# Patient Record
Sex: Female | Born: 1955
Health system: Southern US, Community
[De-identification: ages and names within clinical notes are randomized; demographics above are authoritative.]

## PROBLEM LIST (undated history)

## (undated) DIAGNOSIS — N289 Disorder of kidney and ureter, unspecified: Secondary | ICD-10-CM

## (undated) DIAGNOSIS — M1990 Unspecified osteoarthritis, unspecified site: Secondary | ICD-10-CM

## (undated) DIAGNOSIS — I10 Essential (primary) hypertension: Secondary | ICD-10-CM

## (undated) HISTORY — PX: MOUTH SURGERY: SHX715

---

## 2006-01-04 ENCOUNTER — Emergency Department: Payer: Self-pay | Admitting: Emergency Medicine

## 2006-05-22 ENCOUNTER — Ambulatory Visit (HOSPITAL_COMMUNITY): Admission: RE | Admit: 2006-05-22 | Discharge: 2006-05-22 | Payer: Self-pay | Admitting: *Deleted

## 2007-12-29 ENCOUNTER — Other Ambulatory Visit: Payer: Self-pay

## 2007-12-29 ENCOUNTER — Emergency Department: Payer: Self-pay | Admitting: Emergency Medicine

## 2008-12-29 ENCOUNTER — Ambulatory Visit: Payer: Self-pay | Admitting: Adult Health Nurse Practitioner

## 2010-06-29 ENCOUNTER — Ambulatory Visit: Payer: Self-pay | Admitting: Internal Medicine

## 2010-09-18 ENCOUNTER — Ambulatory Visit: Payer: Self-pay | Admitting: Gastroenterology

## 2010-09-20 LAB — PATHOLOGY REPORT

## 2010-10-11 ENCOUNTER — Emergency Department: Payer: Self-pay | Admitting: Emergency Medicine

## 2010-10-15 ENCOUNTER — Emergency Department: Payer: Self-pay | Admitting: Emergency Medicine

## 2010-11-29 ENCOUNTER — Ambulatory Visit: Payer: Self-pay | Admitting: Anesthesiology

## 2012-01-02 ENCOUNTER — Ambulatory Visit: Payer: Self-pay

## 2012-08-04 ENCOUNTER — Ambulatory Visit: Payer: Self-pay | Admitting: Internal Medicine

## 2012-09-05 ENCOUNTER — Ambulatory Visit: Payer: Self-pay | Admitting: Internal Medicine

## 2013-05-25 ENCOUNTER — Emergency Department: Payer: Self-pay | Admitting: Emergency Medicine

## 2013-05-25 LAB — URINALYSIS, COMPLETE
Bilirubin,UR: NEGATIVE
Blood: NEGATIVE
Glucose,UR: NEGATIVE mg/dL (ref 0–75)
Hyaline Cast: 2
Ketone: NEGATIVE
Leukocyte Esterase: NEGATIVE
Nitrite: NEGATIVE
Ph: 6 (ref 4.5–8.0)
Protein: 100
RBC,UR: <1 /HPF (ref 0–5)
Specific Gravity: 1.014 (ref 1.003–1.030)
Squamous Epithelial: 3
WBC UR: 1 /HPF (ref 0–5)

## 2013-05-28 ENCOUNTER — Ambulatory Visit: Payer: Self-pay

## 2013-06-09 ENCOUNTER — Ambulatory Visit: Payer: Self-pay | Admitting: Internal Medicine

## 2013-11-12 ENCOUNTER — Ambulatory Visit: Payer: Self-pay | Admitting: Vascular Surgery

## 2014-04-13 ENCOUNTER — Ambulatory Visit: Payer: Self-pay

## 2014-04-23 ENCOUNTER — Ambulatory Visit: Payer: Self-pay

## 2014-11-18 ENCOUNTER — Ambulatory Visit: Payer: Self-pay | Admitting: Vascular Surgery

## 2016-01-30 DIAGNOSIS — I10 Essential (primary) hypertension: Secondary | ICD-10-CM | POA: Diagnosis not present

## 2016-01-30 DIAGNOSIS — J45909 Unspecified asthma, uncomplicated: Secondary | ICD-10-CM | POA: Diagnosis not present

## 2016-01-30 DIAGNOSIS — N189 Chronic kidney disease, unspecified: Secondary | ICD-10-CM | POA: Diagnosis not present

## 2016-01-30 DIAGNOSIS — K5900 Constipation, unspecified: Secondary | ICD-10-CM | POA: Diagnosis not present

## 2016-01-30 DIAGNOSIS — F17210 Nicotine dependence, cigarettes, uncomplicated: Secondary | ICD-10-CM | POA: Diagnosis not present

## 2016-02-22 DIAGNOSIS — N183 Chronic kidney disease, stage 3 (moderate): Secondary | ICD-10-CM | POA: Diagnosis not present

## 2016-02-22 DIAGNOSIS — I10 Essential (primary) hypertension: Secondary | ICD-10-CM | POA: Diagnosis not present

## 2016-02-22 DIAGNOSIS — R809 Proteinuria, unspecified: Secondary | ICD-10-CM | POA: Diagnosis not present

## 2016-03-29 DIAGNOSIS — I10 Essential (primary) hypertension: Secondary | ICD-10-CM | POA: Diagnosis not present

## 2016-03-29 DIAGNOSIS — R809 Proteinuria, unspecified: Secondary | ICD-10-CM | POA: Diagnosis not present

## 2016-03-29 DIAGNOSIS — N183 Chronic kidney disease, stage 3 (moderate): Secondary | ICD-10-CM | POA: Diagnosis not present

## 2016-05-08 ENCOUNTER — Ambulatory Visit (INDEPENDENT_AMBULATORY_CARE_PROVIDER_SITE_OTHER): Payer: BLUE CROSS/BLUE SHIELD | Admitting: Podiatry

## 2016-05-08 ENCOUNTER — Ambulatory Visit (INDEPENDENT_AMBULATORY_CARE_PROVIDER_SITE_OTHER): Payer: BLUE CROSS/BLUE SHIELD

## 2016-05-08 ENCOUNTER — Encounter: Payer: Self-pay | Admitting: Podiatry

## 2016-05-08 VITALS — BP 142/95 | HR 55 | Resp 18

## 2016-05-08 DIAGNOSIS — R52 Pain, unspecified: Secondary | ICD-10-CM

## 2016-05-08 DIAGNOSIS — M2010 Hallux valgus (acquired), unspecified foot: Secondary | ICD-10-CM

## 2016-05-08 DIAGNOSIS — M779 Enthesopathy, unspecified: Secondary | ICD-10-CM | POA: Diagnosis not present

## 2016-05-08 DIAGNOSIS — M2040 Other hammer toe(s) (acquired), unspecified foot: Secondary | ICD-10-CM | POA: Diagnosis not present

## 2016-05-08 NOTE — Progress Notes (Signed)
   Subjective:    Patient ID: Elizabeth Roy, female    DOB: [birth year], 60 y.o.   MRN: [id number]  HPI  60 year old female presents the also concerns of right foot pain which is been ongoing for about 1 year. She states that she has intermittent pain to his foot and she cannot recall any inciting incident which makes the foot hurt. She presented may have some digit with standing at night all night at work. Denies any swelling or redness. No evidence or tingling. She points the ball of the foot with majority the pain is on the right side. She has no pain the left side. No recent injury or trauma. No other complaints at this time. She said no recent treatment.  Review of Systems  All other systems reviewed and are negative.      Objective:   Physical Exam General: AAO x3, NAD  Dermatological: Skin is warm, dry and supple bilateral. Nails x 10 are well manicured; remaining integument appears unremarkable at this time. There are no open sores, no preulcerative lesions, no rash or signs of infection present.  Vascular: Dorsalis Pedis artery and Posterior Tibial artery pedal pulses are 2/4 bilateral with immedate capillary fill time. Pedal hair growth present. There is no pain with calf compression, swelling, warmth, erythema.   Neruologic: Grossly intact via light touch bilateral. Vibratory intact via tuning fork bilateral. Protective threshold with Semmes Wienstein monofilament intact to all pedal sites bilateral.   Musculoskeletal: There is tenderness to palpation of the second MTPJ. There is HAV present illness overlapping second toe on the right side worse than left. There is no specific area pinpoint bony tenderness or pain the vibratory sensation. There is no edema, erythema, increase in warmth. No other areas of tenderness bilaterally. MMT 5/5.   Assessment: Right second MPJ capsulitis due to HAV, overlapping second toe  Plan: -Treatment options discussed including all alternatives,  risks, and complications -Etiology of symptoms were discussed -X-rays were obtained and reviewed with the patient. HAV and hammertoe present. No evidence of acute fracture. -Discussed steroid injection but she wishes to hold off as it is not that painful right now. -Dispensed metatarsal offloading pad. Also toe spacer foot hallux and second toe as well as a toe crest help hold the toe down in a rectus position. Also discussed shoe gear modifications with the patient. Also discussed due to surgical interventions should symptoms persist.  -Follow-up at her discretion. Call any questions concerns.  Celesta Gentile, DPM

## 2016-05-11 DIAGNOSIS — F17210 Nicotine dependence, cigarettes, uncomplicated: Secondary | ICD-10-CM | POA: Diagnosis not present

## 2016-05-11 DIAGNOSIS — Z0001 Encounter for general adult medical examination with abnormal findings: Secondary | ICD-10-CM | POA: Diagnosis not present

## 2016-05-11 DIAGNOSIS — M545 Low back pain: Secondary | ICD-10-CM | POA: Diagnosis not present

## 2016-05-11 DIAGNOSIS — I10 Essential (primary) hypertension: Secondary | ICD-10-CM | POA: Diagnosis not present

## 2016-05-11 DIAGNOSIS — N189 Chronic kidney disease, unspecified: Secondary | ICD-10-CM | POA: Diagnosis not present

## 2016-06-14 DIAGNOSIS — Z1231 Encounter for screening mammogram for malignant neoplasm of breast: Secondary | ICD-10-CM | POA: Diagnosis not present

## 2016-06-22 DIAGNOSIS — D229 Melanocytic nevi, unspecified: Secondary | ICD-10-CM | POA: Diagnosis not present

## 2016-07-09 DIAGNOSIS — N183 Chronic kidney disease, stage 3 (moderate): Secondary | ICD-10-CM | POA: Diagnosis not present

## 2016-07-09 DIAGNOSIS — I129 Hypertensive chronic kidney disease with stage 1 through stage 4 chronic kidney disease, or unspecified chronic kidney disease: Secondary | ICD-10-CM | POA: Diagnosis not present

## 2016-07-09 DIAGNOSIS — R809 Proteinuria, unspecified: Secondary | ICD-10-CM | POA: Diagnosis not present

## 2016-07-23 ENCOUNTER — Emergency Department: Payer: BLUE CROSS/BLUE SHIELD

## 2016-07-23 ENCOUNTER — Encounter: Payer: Self-pay | Admitting: Emergency Medicine

## 2016-07-23 ENCOUNTER — Emergency Department
Admission: EM | Admit: 2016-07-23 | Discharge: 2016-07-23 | Disposition: A | Discharge status: Home / Self Care | Payer: BLUE CROSS/BLUE SHIELD | Attending: Emergency Medicine | Admitting: Emergency Medicine

## 2016-07-23 DIAGNOSIS — I10 Essential (primary) hypertension: Secondary | ICD-10-CM | POA: Diagnosis not present

## 2016-07-23 DIAGNOSIS — I209 Angina pectoris, unspecified: Secondary | ICD-10-CM | POA: Diagnosis not present

## 2016-07-23 DIAGNOSIS — M25512 Pain in left shoulder: Secondary | ICD-10-CM | POA: Diagnosis not present

## 2016-07-23 DIAGNOSIS — M5412 Radiculopathy, cervical region: Secondary | ICD-10-CM | POA: Diagnosis not present

## 2016-07-23 HISTORY — DX: Unspecified osteoarthritis, unspecified site: M19.90

## 2016-07-23 HISTORY — DX: Disorder of kidney and ureter, unspecified: N28.9

## 2016-07-23 HISTORY — DX: Essential (primary) hypertension: I10

## 2016-07-23 LAB — BASIC METABOLIC PANEL WITH GFR
Anion gap: 6 (ref 5–15)
BUN: 29 mg/dL — ABNORMAL HIGH (ref 6–20)
CO2: 25 mmol/L (ref 22–32)
Calcium: 9.8 mg/dL (ref 8.9–10.3)
Chloride: 108 mmol/L (ref 101–111)
Creatinine, Ser: 2.2 mg/dL — ABNORMAL HIGH (ref 0.44–1.00)
GFR calc Af Amer: 26 mL/min — ABNORMAL LOW
GFR calc non Af Amer: 23 mL/min — ABNORMAL LOW
Glucose, Bld: 83 mg/dL (ref 65–99)
Potassium: 4.5 mmol/L (ref 3.5–5.1)
Sodium: 139 mmol/L (ref 135–145)

## 2016-07-23 LAB — TROPONIN I: Troponin I: <0.03 ng/mL (ref <0.03)

## 2016-07-23 LAB — CBC
HCT: 36.5 % (ref 35.0–47.0)
Hemoglobin: 11.9 g/dL — ABNORMAL LOW (ref 12.0–16.0)
MCH: 28.1 pg (ref 26.0–34.0)
MCHC: 32.6 g/dL (ref 32.0–36.0)
MCV: 86.3 fL (ref 80.0–100.0)
Platelets: 332 10*3/uL (ref 150–440)
RBC: 4.23 MIL/uL (ref 3.80–5.20)
RDW: 14.6 % — ABNORMAL HIGH (ref 11.5–14.5)
WBC: 4.9 10*3/uL (ref 3.6–11.0)

## 2016-07-23 MED ORDER — PREDNISONE 10 MG (21) PO TBPK: 10.0000 mg | ORAL_TABLET | Freq: Every day | ORAL | 0 refills | Status: DC

## 2016-07-23 NOTE — ED Triage Notes (Signed)
Pt presents to ED after being seen at [facility] for left shoulder pain. [facility] sent pt here for abnormal EKG. Pt denies chest pain, denies SOB, denies any dizziness or lightheadedness. Pt reports that she thinks her shoulder hurts because her breasts are large. Pt reports pain lessens when she takes her bra off.

## 2016-07-23 NOTE — ED Provider Notes (Signed)
[facility] Emergency Department Provider Note    ____________________________________________   I have reviewed the triage vital signs and the nursing notes.   HISTORY  Chief Complaint Shoulder Pain   History limited by: Not Limited   HPI Elizabeth Roy is a 60 y.o. female who presents to the emergency department today because of concern for left shoulder pain. Initially was seen at urgent care but sent to ED given concern in EKG. Patient states that the pain has been present for the past month. Starts in the left trapezius and will radiate down her left arm. Typically stops at her elbow but will occasionally go further. She states that it is an ache. She denies any associated shortness of breath or fevers. Thinks she might have had similar symptoms many years ago that resolved on its own.   Past Medical History:  Diagnosis Date  . Arthritis   . Hypertension   . Renal disorder     There are no active problems to display for this patient.   History reviewed. No pertinent surgical history.  Prior to Admission medications   Medication Sig Start Date End Date Taking? Authorizing Provider  carvedilol (COREG) 12.5 MG tablet Take 12.5 mg by mouth 2 (two) times daily with a meal.    Historical Provider, MD    Allergies Review of patient's allergies indicates no known allergies.  No family history on file.  Social History Social History  Substance Use Topics  . Smoking status: Never Smoker  . Smokeless tobacco: Never Used  . Alcohol use Yes    Review of Systems  Constitutional: Negative for fever. Cardiovascular: Negative for chest pain. Respiratory: Negative for shortness of breath. Gastrointestinal: Negative for abdominal pain, vomiting and diarrhea. Musculoskeletal: Left shoulder pain. Neurological: Negative for headaches, focal weakness or numbness.  10-point ROS otherwise  negative.  ____________________________________________   PHYSICAL EXAM:  VITAL SIGNS: ED Triage Vitals  Enc Vitals Group     BP 07/23/16 1301 (!) 150/101     Pulse Rate 07/23/16 1301 62     Resp 07/23/16 1301 18     Temp 07/23/16 1301 98.1 F (36.7 C)     Temp Source 07/23/16 1301 Oral     SpO2 07/23/16 1301 100 %     Weight 07/23/16 1301 150 lb (68 kg)     Height 07/23/16 1301 4\' 11"  (1.499 m)     Head Circumference --      Peak Flow --      Pain Score 07/23/16 1302 6   Constitutional: Alert and oriented. Well appearing and in no distress. Eyes: Conjunctivae are normal. Normal extraocular movements. ENT   Head: Normocephalic and atraumatic.   Nose: No congestion/rhinnorhea.   Mouth/Throat: Mucous membranes are moist.   Neck: No stridor. Hematological/Lymphatic/Immunilogical: No cervical lymphadenopathy. Cardiovascular: Normal rate, regular rhythm.  No murmurs, rubs, or gallops. Respiratory: Normal respiratory effort without tachypnea nor retractions. Breath sounds are clear and equal bilaterally. No wheezes/rales/rhonchi. Gastrointestinal: Soft and nontender. No distention.  Genitourinary: Deferred Musculoskeletal: Normal range of motion in all extremities. No lower extremity edema. Neurologic:  Normal speech and language. No gross focal neurologic deficits are appreciated.  Skin:  Skin is warm, dry and intact. No rash noted. Psychiatric: Mood and affect are normal. Speech and behavior are normal. Patient exhibits appropriate insight and judgment.  ____________________________________________    LABS (pertinent positives/negatives)  Labs Reviewed  CBC - Abnormal; Notable for the following:       Result  Value   Hemoglobin 11.9 (*)    RDW 14.6 (*)    All other components within normal limits  BASIC METABOLIC PANEL - Abnormal; Notable for the following:    BUN 29 (*)    Creatinine, Ser 2.20 (*)    GFR calc non Af Amer 23 (*)    GFR calc Af Amer 26 (*)     All other components within normal limits  TROPONIN I     ____________________________________________   EKG  I, Nance Pear, attending physician, personally viewed and interpreted this EKG  EKG Time: 1256 Rate: 55 Rhythm: sinus bradycardia Axis: normal Intervals: qtc 413 QRS: narrow ST changes: no st elevation Impression: sinus bradycardia, otherwise normal   ____________________________________________    RADIOLOGY  Left shoulder IMPRESSION:  No acute abnormality.   ____________________________________________   PROCEDURES  Procedures  ____________________________________________   INITIAL IMPRESSION / ASSESSMENT AND PLAN / ED COURSE  Pertinent labs & imaging results that were available during my care of the patient were reviewed by me and considered in my medical decision making (see chart for details).  Patient here with left shoulder pain. Think likely cervical radiculopathy. Sent here for abnormal ekg, however other than bradycardia ekg here within normal limits. Left shoulder x-ray negative. Blood work with slightly elevated creatinine. Patient states she has a history of kidney disease and follows with a kidney doctor. It appears baseline GFR is 30-35.  ____________________________________________   FINAL CLINICAL IMPRESSION(S) / ED DIAGNOSES  Final diagnoses:  Acute pain of left shoulder  Cervical radiculopathy     Note: This dictation was prepared with Dragon dictation. Any transcriptional errors that result from this process are unintentional    Nance Pear, MD 07/23/16 1635

## 2016-07-23 NOTE — ED Notes (Signed)
Pt reports she has left shoulder pain.  No known injury. No chest pain or sob  Intermittent pain in left shoulder and left arm.  Pt states n/v/d.  Hx of htn.  Pt has not taken bp meds today.  Pt alert.  Speech clear.  No diff ambulating.  No diff swallowing   No acute resp distress.

## 2016-07-23 NOTE — Discharge Instructions (Signed)
Please seek medical attention for any high fevers, chest pain, shortness of breath, change in behavior, persistent vomiting, bloody stool or any other new or concerning symptoms.  

## 2016-08-06 DIAGNOSIS — N189 Chronic kidney disease, unspecified: Secondary | ICD-10-CM | POA: Diagnosis not present

## 2016-08-06 DIAGNOSIS — F17210 Nicotine dependence, cigarettes, uncomplicated: Secondary | ICD-10-CM | POA: Diagnosis not present

## 2016-08-06 DIAGNOSIS — B373 Candidiasis of vulva and vagina: Secondary | ICD-10-CM | POA: Diagnosis not present

## 2016-08-06 DIAGNOSIS — M25512 Pain in left shoulder: Secondary | ICD-10-CM | POA: Diagnosis not present

## 2016-08-06 DIAGNOSIS — E2839 Other primary ovarian failure: Secondary | ICD-10-CM | POA: Diagnosis not present

## 2016-08-07 ENCOUNTER — Other Ambulatory Visit: Payer: Self-pay | Admitting: Nurse Practitioner

## 2016-08-07 DIAGNOSIS — M79671 Pain in right foot: Secondary | ICD-10-CM

## 2016-08-07 DIAGNOSIS — M7731 Calcaneal spur, right foot: Secondary | ICD-10-CM

## 2016-08-23 ENCOUNTER — Telehealth (INDEPENDENT_AMBULATORY_CARE_PROVIDER_SITE_OTHER): Payer: Self-pay | Admitting: Vascular Surgery

## 2016-08-23 NOTE — Telephone Encounter (Signed)
Pt calling to schedule her Aortic CT per note on her AVS from 2015 that stated.Marland Kitchen"The patient will return in 24 months with an aortic CT." Dr Delana Meyer can you put order in for Mickel Baas

## 2016-11-12 DIAGNOSIS — E782 Mixed hyperlipidemia: Secondary | ICD-10-CM | POA: Diagnosis not present

## 2016-11-12 DIAGNOSIS — B079 Viral wart, unspecified: Secondary | ICD-10-CM | POA: Diagnosis not present

## 2016-11-12 DIAGNOSIS — F17210 Nicotine dependence, cigarettes, uncomplicated: Secondary | ICD-10-CM | POA: Diagnosis not present

## 2016-11-12 DIAGNOSIS — I10 Essential (primary) hypertension: Secondary | ICD-10-CM | POA: Diagnosis not present

## 2016-11-20 ENCOUNTER — Ambulatory Visit: Payer: BLUE CROSS/BLUE SHIELD | Admitting: Podiatry

## 2016-11-22 DIAGNOSIS — N183 Chronic kidney disease, stage 3 (moderate): Secondary | ICD-10-CM | POA: Diagnosis not present

## 2016-11-22 DIAGNOSIS — I10 Essential (primary) hypertension: Secondary | ICD-10-CM | POA: Diagnosis not present

## 2016-11-22 DIAGNOSIS — R809 Proteinuria, unspecified: Secondary | ICD-10-CM | POA: Diagnosis not present

## 2016-11-27 ENCOUNTER — Ambulatory Visit (INDEPENDENT_AMBULATORY_CARE_PROVIDER_SITE_OTHER): Payer: BLUE CROSS/BLUE SHIELD | Admitting: Podiatry

## 2016-11-27 DIAGNOSIS — Q828 Other specified congenital malformations of skin: Secondary | ICD-10-CM | POA: Diagnosis not present

## 2016-11-27 NOTE — Progress Notes (Signed)
   Subjective: Patient presents to the office today for chief complaint of painful callus lesions of the feet. Patient states that the pain is ongoing and is affecting their ability to ambulate without pain. Patient presents today for further treatment and evaluation.  Objective:  Physical Exam General: Alert and oriented x3 in no acute distress  Dermatology: Hyperkeratotic lesion present on the plantar aspect of the right heel. Pain on palpation with a central nucleated core noted.  Skin is warm, dry and supple bilateral lower extremities. Negative for open lesions or macerations.  Vascular: Palpable pedal pulses bilaterally. No edema or erythema noted. Capillary refill within normal limits.  Neurological: Epicritic and protective threshold grossly intact bilaterally.   Musculoskeletal Exam: Pain on palpation at the keratotic lesion noted. Range of motion within normal limits bilateral. Muscle strength 5/5 in all groups bilateral.  Assessment: #1 porokeratosis right plantar heel   Plan of Care:  #1 Patient evaluated #2 Excisional debridement of  keratoic lesion using a chisel blade was performed without incident.  #3 Treated area(s) with Salinocaine and dressed with light dressing. #4 Patient is to return to the clinic PRN.   Edrick Kins, DPM [facility]  Dr. Edrick Kins, [place]                                        [place], [place] [address]                Office [phone]  Fax [phone]

## 2016-12-10 ENCOUNTER — Other Ambulatory Visit
Admission: RE | Admit: 2016-12-10 | Discharge: 2016-12-10 | Disposition: A | Discharge status: Home / Self Care | Payer: BLUE CROSS/BLUE SHIELD | Source: Ambulatory Visit | Attending: Internal Medicine | Admitting: Internal Medicine

## 2016-12-10 DIAGNOSIS — N183 Chronic kidney disease, stage 3 (moderate): Secondary | ICD-10-CM | POA: Diagnosis not present

## 2016-12-10 DIAGNOSIS — E782 Mixed hyperlipidemia: Secondary | ICD-10-CM | POA: Diagnosis not present

## 2016-12-10 DIAGNOSIS — I129 Hypertensive chronic kidney disease with stage 1 through stage 4 chronic kidney disease, or unspecified chronic kidney disease: Secondary | ICD-10-CM | POA: Diagnosis not present

## 2016-12-10 LAB — LIPID PANEL
Cholesterol: 186 mg/dL (ref 0–200)
HDL: 59 mg/dL (ref >40)
LDL Cholesterol: 113 mg/dL — ABNORMAL HIGH (ref 0–99)
Total CHOL/HDL Ratio: 3.2 RATIO
Triglycerides: 70 mg/dL (ref <150)
VLDL: 14 mg/dL (ref 0–40)

## 2016-12-10 LAB — CBC WITH DIFFERENTIAL/PLATELET
Basophils Absolute: 0 10*3/uL (ref 0–0.1)
Basophils Relative: 1 %
Eosinophils Absolute: 0.2 10*3/uL (ref 0–0.7)
Eosinophils Relative: 4 %
HCT: 36.3 % (ref 35.0–47.0)
Hemoglobin: 12.1 g/dL (ref 12.0–16.0)
Lymphocytes Relative: 30 %
Lymphs Abs: 1.3 10*3/uL (ref 1.0–3.6)
MCH: 28.4 pg (ref 26.0–34.0)
MCHC: 33.5 g/dL (ref 32.0–36.0)
MCV: 84.9 fL (ref 80.0–100.0)
Monocytes Absolute: 0.4 10*3/uL (ref 0.2–0.9)
Monocytes Relative: 8 %
Neutro Abs: 2.6 10*3/uL (ref 1.4–6.5)
Neutrophils Relative %: 57 %
Platelets: 329 10*3/uL (ref 150–440)
RBC: 4.28 MIL/uL (ref 3.80–5.20)
RDW: 14.4 % (ref 11.5–14.5)
WBC: 4.5 10*3/uL (ref 3.6–11.0)

## 2016-12-10 LAB — COMPREHENSIVE METABOLIC PANEL
ALT: 9 U/L — ABNORMAL LOW (ref 14–54)
AST: 15 U/L (ref 15–41)
Albumin: 4 g/dL (ref 3.5–5.0)
Alkaline Phosphatase: 55 U/L (ref 38–126)
Anion gap: 4 — ABNORMAL LOW (ref 5–15)
BUN: 40 mg/dL — ABNORMAL HIGH (ref 6–20)
CO2: 26 mmol/L (ref 22–32)
Calcium: 9.7 mg/dL (ref 8.9–10.3)
Chloride: 111 mmol/L (ref 101–111)
Creatinine, Ser: 1.94 mg/dL — ABNORMAL HIGH (ref 0.44–1.00)
GFR calc Af Amer: 31 mL/min — ABNORMAL LOW (ref >60)
GFR calc non Af Amer: 27 mL/min — ABNORMAL LOW (ref >60)
Glucose, Bld: 88 mg/dL (ref 65–99)
Potassium: 4.2 mmol/L (ref 3.5–5.1)
Sodium: 141 mmol/L (ref 135–145)
Total Bilirubin: 0.4 mg/dL (ref 0.3–1.2)
Total Protein: 7.4 g/dL (ref 6.5–8.1)

## 2016-12-10 LAB — TSH: TSH: 1.958 u[IU]/mL (ref 0.350–4.500)

## 2016-12-10 LAB — T4, FREE: Free T4: 0.84 ng/dL (ref 0.61–1.12)

## 2016-12-13 DIAGNOSIS — E782 Mixed hyperlipidemia: Secondary | ICD-10-CM | POA: Diagnosis not present

## 2016-12-13 DIAGNOSIS — I10 Essential (primary) hypertension: Secondary | ICD-10-CM | POA: Diagnosis not present

## 2016-12-13 DIAGNOSIS — N183 Chronic kidney disease, stage 3 (moderate): Secondary | ICD-10-CM | POA: Diagnosis not present

## 2016-12-13 DIAGNOSIS — F17210 Nicotine dependence, cigarettes, uncomplicated: Secondary | ICD-10-CM | POA: Diagnosis not present

## 2017-02-12 DIAGNOSIS — F17210 Nicotine dependence, cigarettes, uncomplicated: Secondary | ICD-10-CM | POA: Diagnosis not present

## 2017-02-12 DIAGNOSIS — I10 Essential (primary) hypertension: Secondary | ICD-10-CM | POA: Diagnosis not present

## 2017-02-12 DIAGNOSIS — E782 Mixed hyperlipidemia: Secondary | ICD-10-CM | POA: Diagnosis not present

## 2017-02-12 DIAGNOSIS — N183 Chronic kidney disease, stage 3 (moderate): Secondary | ICD-10-CM | POA: Diagnosis not present

## 2017-03-08 DIAGNOSIS — B350 Tinea barbae and tinea capitis: Secondary | ICD-10-CM | POA: Diagnosis not present

## 2017-03-08 DIAGNOSIS — H1033 Unspecified acute conjunctivitis, bilateral: Secondary | ICD-10-CM | POA: Diagnosis not present

## 2017-03-08 DIAGNOSIS — R030 Elevated blood-pressure reading, without diagnosis of hypertension: Secondary | ICD-10-CM | POA: Diagnosis not present

## 2017-03-08 DIAGNOSIS — L235 Allergic contact dermatitis due to other chemical products: Secondary | ICD-10-CM | POA: Diagnosis not present

## 2017-03-20 DIAGNOSIS — L249 Irritant contact dermatitis, unspecified cause: Secondary | ICD-10-CM | POA: Diagnosis not present

## 2017-05-28 DIAGNOSIS — N183 Chronic kidney disease, stage 3 (moderate): Secondary | ICD-10-CM | POA: Diagnosis not present

## 2017-05-28 DIAGNOSIS — R809 Proteinuria, unspecified: Secondary | ICD-10-CM | POA: Diagnosis not present

## 2017-05-28 DIAGNOSIS — I10 Essential (primary) hypertension: Secondary | ICD-10-CM | POA: Diagnosis not present

## 2017-06-18 DIAGNOSIS — Z0001 Encounter for general adult medical examination with abnormal findings: Secondary | ICD-10-CM | POA: Diagnosis not present

## 2017-06-18 DIAGNOSIS — F17210 Nicotine dependence, cigarettes, uncomplicated: Secondary | ICD-10-CM | POA: Diagnosis not present

## 2017-06-18 DIAGNOSIS — M545 Low back pain: Secondary | ICD-10-CM | POA: Diagnosis not present

## 2017-06-18 DIAGNOSIS — N898 Other specified noninflammatory disorders of vagina: Secondary | ICD-10-CM | POA: Diagnosis not present

## 2017-06-18 DIAGNOSIS — N183 Chronic kidney disease, stage 3 (moderate): Secondary | ICD-10-CM | POA: Diagnosis not present

## 2017-10-16 DIAGNOSIS — B079 Viral wart, unspecified: Secondary | ICD-10-CM | POA: Insufficient documentation

## 2017-10-16 DIAGNOSIS — N898 Other specified noninflammatory disorders of vagina: Secondary | ICD-10-CM | POA: Insufficient documentation

## 2017-10-16 DIAGNOSIS — B373 Candidiasis of vulva and vagina: Secondary | ICD-10-CM | POA: Insufficient documentation

## 2017-10-16 DIAGNOSIS — J069 Acute upper respiratory infection, unspecified: Secondary | ICD-10-CM | POA: Insufficient documentation

## 2017-10-16 DIAGNOSIS — I714 Abdominal aortic aneurysm, without rupture, unspecified: Secondary | ICD-10-CM | POA: Insufficient documentation

## 2017-10-16 DIAGNOSIS — I10 Essential (primary) hypertension: Secondary | ICD-10-CM | POA: Insufficient documentation

## 2017-10-16 DIAGNOSIS — J300 Vasomotor rhinitis: Secondary | ICD-10-CM | POA: Insufficient documentation

## 2017-10-16 DIAGNOSIS — K5900 Constipation, unspecified: Secondary | ICD-10-CM | POA: Insufficient documentation

## 2017-10-16 DIAGNOSIS — E782 Mixed hyperlipidemia: Secondary | ICD-10-CM | POA: Insufficient documentation

## 2017-10-16 DIAGNOSIS — M25512 Pain in left shoulder: Secondary | ICD-10-CM | POA: Insufficient documentation

## 2017-10-16 DIAGNOSIS — J45909 Unspecified asthma, uncomplicated: Secondary | ICD-10-CM | POA: Insufficient documentation

## 2017-10-16 DIAGNOSIS — B3731 Acute candidiasis of vulva and vagina: Secondary | ICD-10-CM | POA: Insufficient documentation

## 2017-10-16 DIAGNOSIS — R0602 Shortness of breath: Secondary | ICD-10-CM | POA: Insufficient documentation

## 2017-10-17 ENCOUNTER — Ambulatory Visit: Payer: BLUE CROSS/BLUE SHIELD | Admitting: Nurse Practitioner

## 2017-10-17 ENCOUNTER — Encounter: Payer: Self-pay | Admitting: Nurse Practitioner

## 2017-10-17 VITALS — BP 130/80 | HR 60 | Resp 16 | Ht 60.0 in | Wt 153.8 lb

## 2017-10-17 DIAGNOSIS — I10 Essential (primary) hypertension: Secondary | ICD-10-CM

## 2017-10-17 DIAGNOSIS — M545 Low back pain, unspecified: Secondary | ICD-10-CM

## 2017-10-17 DIAGNOSIS — N182 Chronic kidney disease, stage 2 (mild): Secondary | ICD-10-CM | POA: Diagnosis not present

## 2017-10-17 NOTE — Progress Notes (Signed)
[facility] [place], [place] [address]  Internal MEDICINE  Office Visit Note  Patient Name: Elizabeth Roy  [id number]  [id number]  Date of Service: 10/17/2017     Complaints/HPI Pt is here for routine follow up.  The patient is here for routine follow up exam. States that she sometimes has some shoulder pain/muscle spasms. Feels like exertion might make the pain worse. Still has some right sided lower back pain. This is made worse, also with exertion. Only able to take tylenol to help the pain. This does help some, but not a whole llot.  Concerned about blood pressure being high, however, bp looks good today.     Current Medication: Outpatient Encounter Medications as of 10/17/2017  Medication Sig  . amLODipine (NORVASC) 10 MG tablet   . bisoprolol-hydrochlorothiazide (ZIAC) 5-6.25 MG tablet TK 1 T PO QD FOR BP  . olmesartan (BENICAR) 40 MG tablet Take 40 mg by mouth daily.  . traMADol (ULTRAM) 50 MG tablet Take 50 mg by mouth daily as needed for moderate pain.  . [DISCONTINUED] chlorthalidone (HYGROTON) 25 MG tablet Take 25 mg by mouth daily.  . [DISCONTINUED] indomethacin (INDOCIN) 25 MG capsule TK ONE C PO TID PRN P  . [DISCONTINUED] predniSONE (STERAPRED UNI-PAK 21 TAB) 10 MG (21) TBPK tablet Take 1 tablet (10 mg total) by mouth daily. Taper as instructed on packaging.  . [DISCONTINUED] carvedilol (COREG) 12.5 MG tablet Take 12.5 mg by mouth 2 (two) times daily with a meal.  . [DISCONTINUED] valsartan (DIOVAN) 320 MG tablet    No facility-administered encounter medications on file as of 10/17/2017.     Surgical History: No past surgical history on file.  Medical History: Past Medical History:  Diagnosis Date  . Arthritis   . Hypertension   . Renal disorder     Family History: No family history on file.  Social History   Socioeconomic History  . Marital status: Widowed    Spouse name: Not on file  . Number of children: Not on file  .  Years of education: Not on file  . Highest education level: Not on file  Social Needs  . Financial resource strain: Not on file  . Food insecurity - worry: Not on file  . Food insecurity - inability: Not on file  . Transportation needs - medical: Not on file  . Transportation needs - non-medical: Not on file  Occupational History  . Not on file  Tobacco Use  . Smoking status: Current Some Day Smoker  . Smokeless tobacco: Never Used  Substance and Sexual Activity  . Alcohol use: Yes  . Drug use: No  . Sexual activity: Not on file  Other Topics Concern  . Not on file  Social History Narrative  . Not on file    Today's Vitals   10/17/17 1026  BP: 130/80  Pulse: 60  Resp: 16  SpO2: 94%  Weight: 153 lb 12.8 oz (69.8 kg)  Height: 5' (1.524 m)    Review of Systems  Constitutional:       Mild fatigue  HENT: Negative for congestion.   Eyes: Negative.   Respiratory: Negative for cough, shortness of breath and wheezing.   Cardiovascular: Negative for chest pain, palpitations and leg swelling.  Gastrointestinal: Negative for constipation, diarrhea, nausea and vomiting.  Musculoskeletal: Positive for back pain and myalgias.  Skin: Negative for itching and rash.  Neurological: Negative for weakness and headaches.  Psychiatric/Behavioral: Negative for depression. The patient  is not nervous/anxious.     Physical Exam  Constitutional: She is oriented to person, place, and time and well-developed, well-nourished, and in no distress.  HENT:  Head: Normocephalic and atraumatic.  Eyes: Pupils are equal, round, and reactive to light.  Neck: Normal range of motion. Neck supple. No JVD present. Carotid bruit is not present. No thyromegaly present.  Cardiovascular: Normal rate, regular rhythm and normal heart sounds.  Pulmonary/Chest: Effort normal and breath sounds normal. She has no wheezes.  Abdominal: Soft. Bowel sounds are normal. There is no tenderness.  Musculoskeletal:        Back:  Neurological: She is alert and oriented to person, place, and time.  Skin: Skin is warm and dry.  Psychiatric: Affect normal.  Nursing note and vitals reviewed.  Assessment and plan    ICD-10-CM   1. Essential hypertension I10   2. Low back pain, episodic M54.5   3. Chronic kidney disease, stage 2, mildly decreased GFR N18.2    1. Continue bp medication as prescribed  2. May use tramadol QD if needed. No new rx needed today, but will fill when indicated. Advised her to rest, apply heating pad to affected areas for 20-30 minutes to relieve tight muscles.  3. CKD - continue to see Dr. Candiss Norse, nephrology for continued evaluation.    General Counseling: I have discussed the findings of the evaluation and examination with Roberta.  I have also discussed any further diagnostic evaluation that may be needed or ordered today. Roberta verbalizes understanding of the findings of todays visit. We also reviewed her medications today. she has been encouraged to call the office with any questions or concerns that should arise related to todays visit.  This patient was seen by Leretha Pol, FNP- C in Collaboration with Dr Lavera Guise as a part of collaborative care agreement  Patient Active Problem List   Diagnosis Date Noted  . Other specified noninflammatory disorders of vagina 10/16/2017  . Viral wart 10/16/2017  . Abdominal aortic aneurysm without rupture ([place]) 10/16/2017  . Constipation 10/16/2017  . Pain in left shoulder 10/16/2017  . Acute upper respiratory infection 10/16/2017  . Candidiasis of vulva and vagina 10/16/2017  . Unspecified asthma, uncomplicated 65/12/5463  . Vasomotor rhinitis 10/16/2017  . Mixed hyperlipidemia 10/16/2017  . SOB (shortness of breath) 10/16/2017  . Essential hypertension 10/16/2017    This patient was seen by Leretha Pol, FNP- C in Collaboration with Dr Lavera Guise as a part of collaborative care agreement  Follow up 4 months and sooner if  needed

## 2017-11-14 ENCOUNTER — Other Ambulatory Visit: Payer: Self-pay

## 2017-11-14 MED ORDER — OLMESARTAN MEDOXOMIL 40 MG PO TABS: 40.0000 mg | ORAL_TABLET | Freq: Every day | ORAL | 1 refills | Status: DC

## 2017-11-29 DIAGNOSIS — R809 Proteinuria, unspecified: Secondary | ICD-10-CM | POA: Diagnosis not present

## 2017-11-29 DIAGNOSIS — I10 Essential (primary) hypertension: Secondary | ICD-10-CM | POA: Diagnosis not present

## 2017-11-29 DIAGNOSIS — N184 Chronic kidney disease, stage 4 (severe): Secondary | ICD-10-CM | POA: Diagnosis not present

## 2017-12-10 ENCOUNTER — Other Ambulatory Visit
Admission: RE | Admit: 2017-12-10 | Discharge: 2017-12-10 | Disposition: A | Discharge status: Home / Self Care | Payer: BLUE CROSS/BLUE SHIELD | Source: Ambulatory Visit | Attending: Orthopedic Surgery | Admitting: Orthopedic Surgery

## 2017-12-10 DIAGNOSIS — M25473 Effusion, unspecified ankle: Secondary | ICD-10-CM | POA: Insufficient documentation

## 2017-12-10 DIAGNOSIS — M25471 Effusion, right ankle: Secondary | ICD-10-CM | POA: Diagnosis not present

## 2017-12-10 LAB — URIC ACID: Uric Acid, Serum: 8.4 mg/dL — ABNORMAL HIGH (ref 2.3–6.6)

## 2017-12-13 DIAGNOSIS — M25471 Effusion, right ankle: Secondary | ICD-10-CM | POA: Diagnosis not present

## 2018-02-12 ENCOUNTER — Other Ambulatory Visit: Payer: Self-pay | Admitting: Internal Medicine

## 2018-02-20 ENCOUNTER — Ambulatory Visit: Payer: BLUE CROSS/BLUE SHIELD | Admitting: Nurse Practitioner

## 2018-02-20 ENCOUNTER — Encounter: Payer: Self-pay | Admitting: Nurse Practitioner

## 2018-02-20 VITALS — BP 141/94 | HR 60 | Resp 16 | Ht 59.0 in | Wt 151.0 lb

## 2018-02-20 DIAGNOSIS — I739 Peripheral vascular disease, unspecified: Secondary | ICD-10-CM

## 2018-02-20 DIAGNOSIS — I779 Disorder of arteries and arterioles, unspecified: Secondary | ICD-10-CM

## 2018-02-20 DIAGNOSIS — E782 Mixed hyperlipidemia: Secondary | ICD-10-CM

## 2018-02-20 DIAGNOSIS — I10 Essential (primary) hypertension: Secondary | ICD-10-CM

## 2018-02-20 DIAGNOSIS — E559 Vitamin D deficiency, unspecified: Secondary | ICD-10-CM

## 2018-02-20 DIAGNOSIS — I712 Thoracic aortic aneurysm, without rupture, unspecified: Secondary | ICD-10-CM

## 2018-02-20 MED ORDER — OLMESARTAN MEDOXOMIL 40 MG PO TABS: 40.0000 mg | ORAL_TABLET | Freq: Every day | ORAL | 4 refills | Status: DC

## 2018-02-20 MED ORDER — BISOPROLOL-HYDROCHLOROTHIAZIDE 5-6.25 MG PO TABS: 1.0000 | ORAL_TABLET | Freq: Every day | ORAL | 4 refills | Status: DC

## 2018-02-20 MED ORDER — AMLODIPINE BESYLATE 10 MG PO TABS: 10.0000 mg | ORAL_TABLET | Freq: Every day | ORAL | 4 refills | Status: DC

## 2018-02-20 MED ORDER — OLMESARTAN MEDOXOMIL 40 MG PO TABS
40.0000 mg | ORAL_TABLET | Freq: Every day | ORAL | 1 refills | Status: DC
Start: 2018-02-20 — End: 2018-02-20

## 2018-02-20 NOTE — Progress Notes (Signed)
[facility] [place], [place] [address]  Internal MEDICINE  Office Visit Note  Patient Name: Elizabeth Roy  [id number]  [id number]  Date of Service: 02/21/2018    Pt is here for routine follow up.    Chief Complaint  Patient presents with  . Neck Pain    The patient states that she also has some lower abdominal discomfort. Was told several years back that she did have aneurysm in abdominal aorta. She did see vein and vascular provider. Most recent scan was done in 2016 and was essentially unchanged from the prior scan. The CT did show diffuse aneurysmal dilation of thoracic aorta, extending to proximal abdominal aorta. Also showed generalized atherosclerosis of the aorta and coronary arteries.  She admits she never did follow up with vein and vascular after that.   Neck Pain   This is a recurrent problem. The current episode started more than 1 month ago. The problem occurs intermittently. The pain is associated with nothing. The pain is present in the right side. The quality of the pain is described as burning and aching. The pain is mild. The symptoms are aggravated by stress and twisting. The pain is same all the time. Pertinent negatives include no chest pain, headaches, numbness or weakness. She has tried acetaminophen for the symptoms. The treatment provided mild relief.       Current Medication: Outpatient Encounter Medications as of 02/20/2018  Medication Sig  . amLODipine (NORVASC) 10 MG tablet Take 1 tablet (10 mg total) by mouth daily.  . bisoprolol-hydrochlorothiazide (ZIAC) 5-6.25 MG tablet Take 1 tablet by mouth daily.  Marland Kitchen olmesartan (BENICAR) 40 MG tablet Take 1 tablet (40 mg total) by mouth at bedtime.  . traMADol (ULTRAM) 50 MG tablet Take 50 mg by mouth daily as needed for moderate pain.  . [DISCONTINUED] amLODipine (NORVASC) 10 MG tablet TAKE 1 TABLET BY MOUTH EVERY DAY  . [DISCONTINUED] bisoprolol-hydrochlorothiazide (ZIAC) 5-6.25 MG tablet  TK 1 T PO QD FOR BP  . [DISCONTINUED] olmesartan (BENICAR) 40 MG tablet Take 1 tablet (40 mg total) by mouth at bedtime.  . [DISCONTINUED] olmesartan (BENICAR) 40 MG tablet Take 1 tablet (40 mg total) by mouth at bedtime.   No facility-administered encounter medications on file as of 02/20/2018.     Surgical History: History reviewed. No pertinent surgical history.  Medical History: Past Medical History:  Diagnosis Date  . Arthritis   . Hypertension   . Renal disorder     Family History: History reviewed. No pertinent family history.  Social History   Socioeconomic History  . Marital status: Widowed    Spouse name: Not on file  . Number of children: Not on file  . Years of education: Not on file  . Highest education level: Not on file  Occupational History  . Not on file  Social Needs  . Financial resource strain: Not on file  . Food insecurity:    Worry: Not on file    Inability: Not on file  . Transportation needs:    Medical: Not on file    Non-medical: Not on file  Tobacco Use  . Smoking status: Current Some Day Smoker  . Smokeless tobacco: Never Used  Substance and Sexual Activity  . Alcohol use: Yes  . Drug use: No  . Sexual activity: Not on file  Lifestyle  . Physical activity:    Days per week: Not on file    Minutes per session: Not on file  .  Stress: Not on file  Relationships  . Social connections:    Talks on phone: Not on file    Gets together: Not on file    Attends religious service: Not on file    Active member of club or organization: Not on file    Attends meetings of clubs or organizations: Not on file    Relationship status: Not on file  . Intimate partner violence:    Fear of current or ex partner: Not on file    Emotionally abused: Not on file    Physically abused: Not on file    Forced sexual activity: Not on file  Other Topics Concern  . Not on file  Social History Narrative  . Not on file      Review of Systems   Constitutional: Negative for activity change, chills, fatigue and unexpected weight change.  HENT: Negative for congestion, postnasal drip, rhinorrhea, sneezing and sore throat.   Eyes: Negative.  Negative for redness.  Respiratory: Negative for cough, chest tightness, shortness of breath and wheezing.   Cardiovascular: Negative for chest pain and palpitations.  Gastrointestinal: Negative for abdominal pain, constipation, diarrhea, nausea and vomiting.       C/o mild abdominal tenderness, right side of the abdomen. Very intermittent.   Endocrine: Negative for cold intolerance, heat intolerance, polydipsia, polyphagia and polyuria.  Genitourinary: Negative.  Negative for dysuria, frequency, urgency and vaginal pain.  Musculoskeletal: Positive for back pain and neck pain. Negative for arthralgias and joint swelling.  Skin: Negative for rash.  Allergic/Immunologic: Negative for environmental allergies.  Neurological: Negative for dizziness, tremors, weakness, numbness and headaches.  Hematological: Negative for adenopathy. Does not bruise/bleed easily.  Psychiatric/Behavioral: Negative for behavioral problems (Depression), sleep disturbance and suicidal ideas. The patient is not nervous/anxious.     Today's Vitals   02/20/18 1014  BP: (!) 141/94  Pulse: 60  Resp: 16  SpO2: 100%  Weight: 151 lb (68.5 kg)  Height: 4\' 11"  (1.499 m)    Physical Exam  Constitutional: She is oriented to person, place, and time. She appears well-developed and well-nourished. No distress.  HENT:  Head: Normocephalic and atraumatic.  Nose: Nose normal.  Mouth/Throat: Oropharynx is clear and moist. No oropharyngeal exudate.  Eyes: Pupils are equal, round, and reactive to light. Conjunctivae and EOM are normal.  Neck: Normal range of motion. Neck supple. No JVD present. Carotid bruit is present. No tracheal deviation present. No thyromegaly present.  Soft carotid bruit heard bilaterally.   Cardiovascular:  Normal rate, regular rhythm and normal heart sounds. Exam reveals no gallop and no friction rub.  No murmur heard. Pulmonary/Chest: Effort normal and breath sounds normal. No respiratory distress. She has no wheezes. She has no rales. She exhibits no tenderness.  Abdominal: Soft. Bowel sounds are normal. There is no tenderness.  Musculoskeletal: Normal range of motion.  Lymphadenopathy:    She has no cervical adenopathy.  Neurological: She is alert and oriented to person, place, and time. No cranial nerve deficit.  Skin: Skin is warm and dry. She is not diaphoretic.  Psychiatric: She has a normal mood and affect. Her behavior is normal. Judgment and thought content normal.  Nursing note and vitals reviewed.  Assessment/Plan: 1. Essential hypertension BP stable. Continue blood pressure medication as prescribed. Refills provided today. - CBC with Differential/Platelet - Comprehensive metabolic panel - T4, free - TSH - Lipid panel - amLODipine (NORVASC) 10 MG tablet; Take 1 tablet (10 mg total) by mouth daily.  Dispense: 90  tablet; Refill: 4 - bisoprolol-hydrochlorothiazide (ZIAC) 5-6.25 MG tablet; Take 1 tablet by mouth daily.  Dispense: 90 tablet; Refill: 4 - olmesartan (BENICAR) 40 MG tablet; Take 1 tablet (40 mg total) by mouth at bedtime.  Dispense: 90 tablet; Refill: 4  2. Mixed hyperlipidemia Fasting lipid panel ordered today.  - T4, free - TSH - Lipid panel  3. Carotid artery disease, unspecified laterality, unspecified type ([place]) Carotid doppler ultrasound ordered for further evaluation.  - US Carotid Duplex Bilateral; Future  4. Thoracic aortic aneurysm without rupture (HCC) Ultrasound of abdominal aorta and other abdominal vasculature.  - VAS [place] AAA DUPLEX; Future  5. Vitamin D deficiency - Vitamin D 1,25 dihydroxy  General Counseling: Adelei verbalizes understanding of the findings of todays visit and agrees with plan of treatment. I have discussed any further  diagnostic evaluation that may be needed or ordered today. We also reviewed her medications today. she has been encouraged to call the office with any questions or concerns that should arise related to todays visit.    Hypertension Counseling:   The following hypertensive lifestyle modification were recommended and discussed:  1. Limiting alcohol intake to less than 1 oz/day of ethanol:(24 oz of beer or 8 oz of wine or 2 oz of 100-proof whiskey). 2. Take baby ASA 81 mg daily. 3. Importance of regular aerobic exercise and losing weight. 4. Reduce dietary saturated fat and cholesterol intake for overall cardiovascular health. 5. Maintaining adequate dietary potassium, calcium, and magnesium intake. 6. Regular monitoring of the blood pressure. 7. Reduce sodium intake to less than 100 mmol/day (less than 2.3 gm of sodium or less than 6 gm of sodium choride)   This patient was seen by Leretha Pol, FNP- C in Collaboration with Dr Lavera Guise as a part of collaborative care agreement    Orders Placed This Encounter  Procedures  . US Carotid Duplex Bilateral  . CBC with Differential/Platelet  . Comprehensive metabolic panel  . T4, free  . TSH  . Lipid panel  . Vitamin D 1,25 dihydroxy    Meds ordered this encounter  Medications  . DISCONTD: olmesartan (BENICAR) 40 MG tablet    Sig: Take 1 tablet (40 mg total) by mouth at bedtime.    Dispense:  30 tablet    Refill:  1    Order Specific Question:   Supervising Provider    Answer:   Lavera Guise [[id number]]  . amLODipine (NORVASC) 10 MG tablet    Sig: Take 1 tablet (10 mg total) by mouth daily.    Dispense:  90 tablet    Refill:  4    Order Specific Question:   Supervising Provider    Answer:   Lavera Guise [[id number]]  . bisoprolol-hydrochlorothiazide (ZIAC) 5-6.25 MG tablet    Sig: Take 1 tablet by mouth daily.    Dispense:  90 tablet    Refill:  4    Order Specific Question:   Supervising Provider    Answer:   Lavera Guise [[id number]]   . olmesartan (BENICAR) 40 MG tablet    Sig: Take 1 tablet (40 mg total) by mouth at bedtime.    Dispense:  90 tablet    Refill:  4    Order Specific Question:   Supervising Provider    Answer:   Lavera Guise [[id number]]    Time spent: 1 Minutes     Dr Lavera Guise Internal medicine

## 2018-02-21 DIAGNOSIS — I739 Peripheral vascular disease, unspecified: Secondary | ICD-10-CM

## 2018-02-21 DIAGNOSIS — I779 Disorder of arteries and arterioles, unspecified: Secondary | ICD-10-CM | POA: Insufficient documentation

## 2018-02-21 DIAGNOSIS — I712 Thoracic aortic aneurysm, without rupture, unspecified: Secondary | ICD-10-CM | POA: Insufficient documentation

## 2018-02-21 DIAGNOSIS — E559 Vitamin D deficiency, unspecified: Secondary | ICD-10-CM | POA: Insufficient documentation

## 2018-04-15 ENCOUNTER — Other Ambulatory Visit
Admission: RE | Admit: 2018-04-15 | Discharge: 2018-04-15 | Disposition: A | Discharge status: Home / Self Care | Payer: BLUE CROSS/BLUE SHIELD | Source: Ambulatory Visit | Attending: Nurse Practitioner | Admitting: Nurse Practitioner

## 2018-04-15 DIAGNOSIS — E559 Vitamin D deficiency, unspecified: Secondary | ICD-10-CM | POA: Insufficient documentation

## 2018-04-15 DIAGNOSIS — E782 Mixed hyperlipidemia: Secondary | ICD-10-CM | POA: Insufficient documentation

## 2018-04-15 DIAGNOSIS — I10 Essential (primary) hypertension: Secondary | ICD-10-CM | POA: Diagnosis not present

## 2018-04-15 LAB — CBC WITH DIFFERENTIAL/PLATELET
Basophils Absolute: 0 10*3/uL (ref 0–0.1)
Basophils Relative: 1 %
Eosinophils Absolute: 0.2 10*3/uL (ref 0–0.7)
Eosinophils Relative: 5 %
HCT: 34.5 % — ABNORMAL LOW (ref 35.0–47.0)
Hemoglobin: 11.3 g/dL — ABNORMAL LOW (ref 12.0–16.0)
Lymphocytes Relative: 20 %
Lymphs Abs: 1.1 10*3/uL (ref 1.0–3.6)
MCH: 28.5 pg (ref 26.0–34.0)
MCHC: 32.9 g/dL (ref 32.0–36.0)
MCV: 86.7 fL (ref 80.0–100.0)
Monocytes Absolute: 0.4 10*3/uL (ref 0.2–0.9)
Monocytes Relative: 8 %
Neutro Abs: 3.6 10*3/uL (ref 1.4–6.5)
Neutrophils Relative %: 66 %
Platelets: 316 10*3/uL (ref 150–440)
RBC: 3.97 MIL/uL (ref 3.80–5.20)
RDW: 15.2 % — ABNORMAL HIGH (ref 11.5–14.5)
WBC: 5.4 10*3/uL (ref 3.6–11.0)

## 2018-04-15 LAB — COMPREHENSIVE METABOLIC PANEL
ALT: 9 U/L (ref 0–44)
AST: 14 U/L — ABNORMAL LOW (ref 15–41)
Albumin: 4.1 g/dL (ref 3.5–5.0)
Alkaline Phosphatase: 59 U/L (ref 38–126)
Anion gap: 8 (ref 5–15)
BUN: 37 mg/dL — ABNORMAL HIGH (ref 8–23)
CO2: 23 mmol/L (ref 22–32)
Calcium: 10.1 mg/dL (ref 8.9–10.3)
Chloride: 111 mmol/L (ref 98–111)
Creatinine, Ser: 2.52 mg/dL — ABNORMAL HIGH (ref 0.44–1.00)
GFR calc Af Amer: 22 mL/min — ABNORMAL LOW (ref >60)
GFR calc non Af Amer: 19 mL/min — ABNORMAL LOW (ref >60)
Glucose, Bld: 88 mg/dL (ref 70–99)
Potassium: 4.8 mmol/L (ref 3.5–5.1)
Sodium: 142 mmol/L (ref 135–145)
Total Bilirubin: 0.6 mg/dL (ref 0.3–1.2)
Total Protein: 7.4 g/dL (ref 6.5–8.1)

## 2018-04-15 LAB — LIPID PANEL
Cholesterol: 183 mg/dL (ref 0–200)
HDL: 58 mg/dL (ref >40)
LDL Cholesterol: 108 mg/dL — ABNORMAL HIGH (ref 0–99)
Total CHOL/HDL Ratio: 3.2 RATIO
Triglycerides: 87 mg/dL (ref <150)
VLDL: 17 mg/dL (ref 0–40)

## 2018-04-15 LAB — TSH: TSH: 1.306 u[IU]/mL (ref 0.350–4.500)

## 2018-04-15 LAB — T4, FREE: Free T4: 0.8 ng/dL — ABNORMAL LOW (ref 0.82–1.77)

## 2018-04-16 LAB — VITAMIN D 25 HYDROXY (VIT D DEFICIENCY, FRACTURES): Vit D, 25-Hydroxy: 11.9 ng/mL — ABNORMAL LOW (ref 30.0–100.0)

## 2018-04-25 ENCOUNTER — Ambulatory Visit: Payer: BLUE CROSS/BLUE SHIELD

## 2018-04-25 ENCOUNTER — Encounter (INDEPENDENT_AMBULATORY_CARE_PROVIDER_SITE_OTHER): Payer: Self-pay

## 2018-04-25 DIAGNOSIS — I712 Thoracic aortic aneurysm, without rupture, unspecified: Secondary | ICD-10-CM

## 2018-05-02 ENCOUNTER — Encounter (INDEPENDENT_AMBULATORY_CARE_PROVIDER_SITE_OTHER): Payer: Self-pay | Admitting: Vascular Surgery

## 2018-05-02 ENCOUNTER — Ambulatory Visit (INDEPENDENT_AMBULATORY_CARE_PROVIDER_SITE_OTHER): Payer: BLUE CROSS/BLUE SHIELD | Admitting: Vascular Surgery

## 2018-05-02 VITALS — BP 151/99 | HR 58 | Resp 16 | Ht 59.0 in | Wt 151.4 lb

## 2018-05-02 DIAGNOSIS — I713 Abdominal aortic aneurysm, ruptured, unspecified: Secondary | ICD-10-CM

## 2018-05-02 DIAGNOSIS — I716 Thoracoabdominal aortic aneurysm, without rupture, unspecified: Secondary | ICD-10-CM | POA: Insufficient documentation

## 2018-05-02 DIAGNOSIS — N289 Disorder of kidney and ureter, unspecified: Secondary | ICD-10-CM | POA: Diagnosis not present

## 2018-05-02 DIAGNOSIS — I10 Essential (primary) hypertension: Secondary | ICD-10-CM | POA: Diagnosis not present

## 2018-05-02 DIAGNOSIS — F17200 Nicotine dependence, unspecified, uncomplicated: Secondary | ICD-10-CM

## 2018-05-02 DIAGNOSIS — I712 Thoracic aortic aneurysm, without rupture, unspecified: Secondary | ICD-10-CM

## 2018-05-02 NOTE — Progress Notes (Signed)
Patient ID: Elizabeth Roy, female   DOB: [birth year], 62 y.o.   MRN: [id number]  Chief Complaint  Patient presents with  . New Patient (Initial Visit)    stat AAA 6.0    HPI Elizabeth Roy is a 62 y.o. female.  I am asked to see the patient by H. Boscia, NP for evaluation of aortic aneurysm.  The patient reports last being checked for this aneurysm she thought a year or 2 ago but in reality it was early in 2016.  At that time, she had a report of a CT scan showing a 4 cm thoracoabdominal aneurysm.  I do not have the actual films to review.  She smokes.  She has poorly controlled blood pressure.  She has significant chronic kidney disease with a baseline creatinine of about 2.5.  She has not had any new back or abdominal pain.  No signs of peripheral embolization.  Her primary care physician ordered an ultrasound which suggested a 6 cm aneurysm in the proximal aorta.  As such, she is referred for further evaluation and treatment   Past Medical History:  Diagnosis Date  . Arthritis   . Hypertension   . Renal disorder     Past Surgical History:  Procedure Laterality Date  . MOUTH SURGERY      Family History  Problem Relation Age of Onset  . Hypertension Mother   . Intellectual disability Sister   . Stroke Sister   No bleeding or clotting disorders  Social History Social History   Tobacco Use  . Smoking status: Current Some Day Smoker  . Smokeless tobacco: Never Used  Substance Use Topics  . Alcohol use: Yes  . Drug use: No    No Known Allergies  Current Outpatient Medications  Medication Sig Dispense Refill  . amLODipine (NORVASC) 10 MG tablet Take 1 tablet (10 mg total) by mouth daily. 90 tablet 4  . bisoprolol-hydrochlorothiazide (ZIAC) 5-6.25 MG tablet Take 1 tablet by mouth daily. 90 tablet 4  . olmesartan (BENICAR) 40 MG tablet Take 1 tablet (40 mg total) by mouth at bedtime. 90 tablet 4  . traMADol (ULTRAM) 50 MG tablet Take 50 mg by mouth daily as needed for  moderate pain.     No current facility-administered medications for this visit.       REVIEW OF SYSTEMS (Negative unless checked)  Constitutional: '[]'$ Weight loss  '[]'$ Fever  '[]'$ Chills Cardiac: '[]'$ Chest pain   '[]'$ Chest pressure   '[]'$ Palpitations   '[]'$ Shortness of breath when laying flat   '[]'$ Shortness of breath at rest   '[]'$ Shortness of breath with exertion. Vascular:  '[]'$ Pain in legs with walking   '[]'$ Pain in legs at rest   '[]'$ Pain in legs when laying flat   '[]'$ Claudication   '[]'$ Pain in feet when walking  '[]'$ Pain in feet at rest  '[]'$ Pain in feet when laying flat   '[]'$ History of DVT   '[]'$ Phlebitis   '[]'$ Swelling in legs   '[]'$ Varicose veins   '[]'$ Non-healing ulcers Pulmonary:   '[]'$ Uses home oxygen   '[]'$ Productive cough   '[]'$ Hemoptysis   '[]'$ Wheeze  '[]'$ COPD   '[]'$ Asthma Neurologic:  '[]'$ Dizziness  '[]'$ Blackouts   '[]'$ Seizures   '[]'$ History of stroke   '[]'$ History of TIA  '[]'$ Aphasia   '[]'$ Temporary blindness   '[]'$ Dysphagia   '[]'$ Weakness or numbness in arms   '[]'$ Weakness or numbness in legs Musculoskeletal:  '[x]'$ Arthritis   '[]'$ Joint swelling   '[]'$ Joint pain   '[]'$ Low back pain Hematologic:  '[]'$ Easy bruising  '[]'$ Easy bleeding   '[]'$   Hypercoagulable state   '[]'$ Anemic  '[]'$ Hepatitis Gastrointestinal:  '[]'$ Blood in stool   '[]'$ Vomiting blood  '[x]'$ Gastroesophageal reflux/heartburn   '[]'$ Abdominal pain Genitourinary:  '[x]'$ Chronic kidney disease   '[]'$ Difficult urination  '[]'$ Frequent urination  '[]'$ Burning with urination   '[]'$ Hematuria Skin:  '[]'$ Rashes   '[]'$ Ulcers   '[]'$ Wounds Psychological:  '[]'$ History of anxiety   '[]'$  History of major depression.    Physical Exam BP (!) 151/99 (BP Location: Right Arm)   Pulse (!) 58   Resp 16   Ht '4\' 11"'$  (1.499 m)   Wt 151 lb 6.4 oz (68.7 kg)   BMI 30.58 kg/m  Gen:  WD/WN, NAD Head: [place]/AT, No temporalis wasting.  Ear/Nose/Throat: Hearing grossly intact, nares w/o erythema or drainage, oropharynx w/o Erythema/Exudate Eyes: Conjunctiva clear, sclera non-icteric  Neck: trachea midline.   Pulmonary:  Good air movement, clear to auscultation  bilaterally.  Cardiac: RRR, no JVD Vascular: Vessel Right Left  Radial Palpable Palpable                          PT Palpable Palpable  DP Palpable Palpable   Gastrointestinal: soft, non-tender/non-distended. Increased aortic impulse Musculoskeletal: M/S 5/5 throughout.  Extremities without ischemic changes.  No deformity or atrophy. Mild LE edema. Neurologic: Sensation grossly intact in extremities.  Symmetrical.  Speech is fluent. Motor exam as listed above. Psychiatric: Judgment intact, Mood & affect appropriate for pt's clinical situation. Dermatologic: No rashes or ulcers noted.  No cellulitis or open wounds.    Radiology No results found.  Labs Recent Results (from the past 2160 hour(s))  VITAMIN D 25 Hydroxy (Vit-D Deficiency, Fractures)     Status: Abnormal   Collection Time: 04/15/18 11:43 AM  Result Value Ref Range   Vit D, 25-Hydroxy 11.9 (L) 30.0 - 100.0 ng/mL    Comment: (NOTE) Vitamin D deficiency has been defined by the [place] practice guideline as a level of serum 25-OH vitamin D less than 20 ng/mL (1,2). The Endocrine Society went on to further define vitamin D insufficiency as a level between 21 and 29 ng/mL (2). 1. IOM (Institute of Medicine). 2010. Dietary reference   intakes for calcium and D. [place]: The   [employer]. 2. Holick MF, Binkley Minerva, Bischoff-Ferrari HA, et al.   Evaluation, treatment, and prevention of vitamin D   deficiency: an Endocrine Society clinical practice   guideline. JCEM. 2011 Jul; 96(7):1911-30. Performed At: [facility] [place], [place] [id number] Rush Farmer MD [id number] Performed at [facility], [address] [place], [place] [address]   Lipid panel     Status: Abnormal   Collection Time: 04/15/18 11:43 AM  Result Value Ref Range   Cholesterol 183 0 - 200 mg/dL   Triglycerides 87 <150 mg/dL   HDL 58 >40 mg/dL    Total CHOL/HDL Ratio 3.2 RATIO   VLDL 17 0 - 40 mg/dL   LDL Cholesterol 108 (H) 0 - 99 mg/dL    Comment:        Total Cholesterol/HDL:CHD Risk Coronary Heart Disease Risk Table                     Men   Women  1/2 Average Risk   3.4   3.3  Average Risk       5.0   4.4  2 X Average Risk   9.6   7.1  3 X Average Risk  23.4   11.0        Use the calculated Patient Ratio above and the CHD Risk Table to determine the patient's CHD Risk.        ATP III CLASSIFICATION (LDL):  <100     mg/dL   Optimal  100-129  mg/dL   Near or Above                    Optimal  130-159  mg/dL   Borderline  160-189  mg/dL   High  >190     mg/dL   Very High Performed at [facility], [address] [place], [place] [address]   TSH     Status: None   Collection Time: 04/15/18 11:43 AM  Result Value Ref Range   TSH 1.306 0.350 - 4.500 uIU/mL    Comment: Performed by a 3rd Generation assay with a functional sensitivity of <=0.01 uIU/mL. Performed at [facility] [place], [address] [place], [place] [address]   T4, free     Status: Abnormal   Collection Time: 04/15/18 11:43 AM  Result Value Ref Range   Free T4 0.80 (L) 0.82 - 1.77 ng/dL    Comment: (NOTE) Biotin ingestion may interfere with free T4 tests. If the results are inconsistent with the TSH level, previous test results, or the clinical presentation, then consider biotin interference. If needed, order repeat testing after stopping biotin. Performed at [facility], [address] [place], [place] [address]   Comprehensive metabolic panel     Status: Abnormal   Collection Time: 04/15/18 11:43 AM  Result Value Ref Range   Sodium 142 135 - 145 mmol/L   Potassium 4.8 3.5 - 5.1 mmol/L   Chloride 111 98 - 111 mmol/L    Comment: Please note change in reference range.   CO2 23 22 - 32 mmol/L   Glucose, Bld 88 70 - 99 mg/dL    Comment: Please note change in reference range.   BUN 37 (H) 8 - 23 mg/dL    Comment:  Please note change in reference range.   Creatinine, Ser 2.52 (H) 0.44 - 1.00 mg/dL   Calcium 10.1 8.9 - 10.3 mg/dL   Total Protein 7.4 6.5 - 8.1 g/dL   Albumin 4.1 3.5 - 5.0 g/dL   AST 14 (L) 15 - 41 U/L   ALT 9 0 - 44 U/L    Comment: Please note change in reference range.   Alkaline Phosphatase 59 38 - 126 U/L   Total Bilirubin 0.6 0.3 - 1.2 mg/dL   GFR calc non Af Amer 19 (L) >60 mL/min   GFR calc Af Amer 22 (L) >60 mL/min    Comment: (NOTE) The eGFR has been calculated using the CKD EPI equation. This calculation has not been validated in all clinical situations. eGFR's persistently <60 mL/min signify possible Chronic Kidney Disease.    Anion gap 8 5 - 15    Comment: Performed at [facility], [address] [place], [place] [address]  CBC with Differential/Platelet     Status: Abnormal   Collection Time: 04/15/18 11:43 AM  Result Value Ref Range   WBC 5.4 3.6 - 11.0 K/uL   RBC 3.97 3.80 - 5.20 MIL/uL   Hemoglobin 11.3 (L) 12.0 - 16.0 g/dL   HCT 34.5 (L) 35.0 - 47.0 %   MCV 86.7 80.0 - 100.0 fL   MCH 28.5 26.0 - 34.0 pg   MCHC 32.9 32.0 -  36.0 g/dL   RDW 15.2 (H) 11.5 - 14.5 %   Platelets 316 150 - 440 K/uL   Neutrophils Relative % 66 %   Neutro Abs 3.6 1.4 - 6.5 K/uL   Lymphocytes Relative 20 %   Lymphs Abs 1.1 1.0 - 3.6 K/uL   Monocytes Relative 8 %   Monocytes Absolute 0.4 0.2 - 0.9 K/uL   Eosinophils Relative 5 %   Eosinophils Absolute 0.2 0 - 0.7 K/uL   Basophils Relative 1 %   Basophils Absolute 0.0 0 - 0.1 K/uL    Comment: Performed at [facility], [address] [place], [place] [address]    Assessment/Plan:  Essential hypertension blood pressure control important in reducing the progression of atherosclerotic disease and aneurysmal degeneration. On appropriate oral medications.   Tobacco use disorder We had a discussion for approximately 3 minutes regarding the absolute need for smoking cessation due to the deleterious nature of  tobacco on the vascular system. We discussed the tobacco use would diminish patency of any intervention, and likely significantly worsen progressio of disease. We discussed multiple agents for quitting including replacement therapy or medications to reduce cravings such as Chantix. The patient voices their understanding of the importance of smoking cessation.  Thoracoabdominal aneurysm [facility]) According to an outside ultrasound, her aneurysm has grown dramatically since it was last checked early in 2016.  It does not appear to have been checked since that time when it was about 4 cm.  This is now estimating 6 cm at the proximal aorta which would likely be the perivisceral aorta by duplex.  This would not have evaluated her thoracic component at all.  At this point, she will need to get a CT scan of the chest abdomen and pelvis for further evaluation.  Apparently, her renal disease precludes contrast for a standard scan, so for sizing purposes we can get a noncontrasted scan.  It was discussed with the patient that the extent of the aneurysm would determine whether or not she would be a candidate for open or endovascular repair as well as whether or not it would be something we would fix or have to refer her for cardiothoracic evaluation.  The deleterious effects of high blood pressure and tobacco were stressed with the patient.  Renal disorder Will get CT without contrast      Leotis Pain 05/02/2018, 9:36 AM   This note was created with Dragon medical transcription system.  Any errors from dictation are unintentional.

## 2018-05-02 NOTE — Assessment & Plan Note (Signed)
blood pressure control important in reducing the progression of atherosclerotic disease and aneurysmal degeneration. On appropriate oral medications.  

## 2018-05-02 NOTE — Assessment & Plan Note (Signed)
Will get CT without contrast

## 2018-05-02 NOTE — Assessment & Plan Note (Signed)

## 2018-05-02 NOTE — Patient Instructions (Signed)
Thoracic Aortic Aneurysm An aneurysm is a bulge in an artery. It happens when blood pushes up against a weakened or damaged artery wall. A thoracic aortic aneurysm is an aneurysm that occurs in the first part of the aorta, between the heart and the diaphragm. The aorta is the main artery of the body. It supplies blood from the heart to the rest of the body. Some aneurysms may not cause symptoms or problems. However, the major concern with a thoracic aortic aneurysm is that it can enlarge and burst (rupture), or blood can flow between the layers of the wall of the aorta through a tear (aorticdissection). Both of these conditions can cause bleeding inside the body and can be life-threatening if they are not diagnosed and treated right away. What are the causes? The exact cause of this condition is not known. What increases the risk? The following factors may make you more likely to develop this condition:  Being age 64 or older.  Having a hardening of the arteries caused by the buildup of fat and other substances in the lining of a blood vessel (arteriosclerosis).  Having inflammation of the walls of an artery (arteritis).  Having a genetic disease that weakens the body's connective tissue, such as Marfan syndrome.  Having an injury or trauma to the aorta.  Having an infection that is caused by bacteria, such as syphilis or staphylococcus, in the wall of the aorta (infectious aortitis).  Having high blood pressure (hypertension).  Being female.  Being white (Caucasian).  Having high cholesterol.  Having a family history of aneurysms.  Using tobacco.  Having chronic obstructive pulmonary disease (COPD).  What are the signs or symptoms? Symptoms of this condition vary depending on the size and rate of growth of the aneurysm. Most grow slowly and do not cause any symptoms. When symptoms do occur, they may include:  Pain in the chest, back, sides, or abdomen. The pain may vary in  intensity. A sudden onset of severe pain may indicate that the aneurysm has ruptured.  Hoarseness.  Cough.  Shortness of breath.  Swallowing problems.  Swelling in the face, arms, or legs.  Fever.  Unexplained weight loss.  How is this diagnosed? This condition may be diagnosed with:  An ultrasound.  X-rays.  A CT scan.  An MRI.  Tests to check the arteries for damage or blockages (angiogram).  Most unruptured thoracic aortic aneurysms cause no symptoms, so they are often found during exams for other conditions. How is this treated? Treatment for this condition depends on:  The size of the aneurysm.  How fast the aneurysm is growing.  Your age.  Risk factors for rupture.  Aneurysms that are smaller than 2.2 inches (5.5 cm) may be managed by using medicines to control blood pressure, manage pain, or fight infection. You may need regular monitoring to see if the aneurysm is getting bigger. Your health care provider may recommend that you have an ultrasound every year or every 6 months. How often you need to have an ultrasound depends on the size of the aneurysm, how fast it is growing, and whether you have a family history of aneurysms. Surgical repair may be needed if your aneurysm is larger than 2.2 inches or if it is growing quickly. Follow these instructions at home: Eating and drinking  Eat a healthy diet. Your health care provider may recommend that you: ? Lower your salt (sodium) intake. In some people, too much salt can raise blood pressure and increase  the risk of thoracic aortic aneurysm. ? Avoid foods that are high in saturated fat and cholesterol, such as red meat and dairy. ? Eat a diet that is low in sugar. ? Increase your fiber intake by including whole grains, vegetables, and fruits in your diet. Eating these foods may help to lower blood pressure.  Limit or avoid alcohol as recommended by your health care provider. Lifestyle  Follow instructions  from your health care provider about healthy lifestyle habits. Your health care provider may recommend that you: ? Do not use any products that contain nicotine or tobacco, such as cigarettes and e-cigarettes. If you need help quitting, ask your health care provider. ? Keep your blood pressure within normal limits. The target limit for most people is below 120/80. Check your blood pressure regularly. If it is high, ask your health care provider about ways that you can control it. ? Keep your blood sugar (glucose) level and cholesterol levels within normal limits. Target limits for most people are:  Blood glucose level: Less than 100 mg/dL.  Total cholesterol level: Less than 200 mg/dL. ? Maintain a healthy weight. Activity  Stay physically active and exercise regularly. Talk with your health care provider about how often you should exercise and ask which types of exercise are safe for you.  Avoid heavy lifting and activities that take a lot of effort (are strenuous). Ask your health care provider what activities are safe for you. General instructions  Keep all follow-up visits as told by your health care provider. This is important. ? Talk with your health care provider about regular screenings to see if the aneurysm is getting bigger.  Take over-the-counter and prescription medicines only as told by your health care provider. Contact a health care provider if:  You have discomfort in your upper back, neck, or abdomen.  You have trouble swallowing.  You have a cough or hoarseness.  You have a family history of aneurysms.  You have unexplained weight loss. Get help right away if:  You have sudden, severe pain in your upper back and abdomen. This pain may move into your chest and arms.  You have shortness of breath.  You have a fever. This information is not intended to replace advice given to you by your health care provider. Make sure you discuss any questions you have with your  health care provider. Document Released: 10/01/2005 Document Revised: 07/13/2016 Document Reviewed: 07/13/2016 Elsevier Interactive Patient Education  [employer].

## 2018-05-02 NOTE — Assessment & Plan Note (Signed)
According to an outside ultrasound, her aneurysm has grown dramatically since it was last checked early in 2016.  It does not appear to have been checked since that time when it was about 4 cm.  This is now estimating 6 cm at the proximal aorta which would likely be the perivisceral aorta by duplex.  This would not have evaluated her thoracic component at all.  At this point, she will need to get a CT scan of the chest abdomen and pelvis for further evaluation.  Apparently, her renal disease precludes contrast for a standard scan, so for sizing purposes we can get a noncontrasted scan.  It was discussed with the patient that the extent of the aneurysm would determine whether or not she would be a candidate for open or endovascular repair as well as whether or not it would be something we would fix or have to refer her for cardiothoracic evaluation.  The deleterious effects of high blood pressure and tobacco were stressed with the patient.

## 2018-05-07 ENCOUNTER — Encounter (INDEPENDENT_AMBULATORY_CARE_PROVIDER_SITE_OTHER): Payer: Self-pay

## 2018-05-09 ENCOUNTER — Ambulatory Visit (INDEPENDENT_AMBULATORY_CARE_PROVIDER_SITE_OTHER): Payer: BLUE CROSS/BLUE SHIELD

## 2018-05-09 DIAGNOSIS — I779 Disorder of arteries and arterioles, unspecified: Secondary | ICD-10-CM | POA: Diagnosis not present

## 2018-05-09 DIAGNOSIS — I739 Peripheral vascular disease, unspecified: Principal | ICD-10-CM

## 2018-05-12 ENCOUNTER — Other Ambulatory Visit (INDEPENDENT_AMBULATORY_CARE_PROVIDER_SITE_OTHER): Payer: Self-pay | Admitting: Vascular Surgery

## 2018-05-12 DIAGNOSIS — I713 Abdominal aortic aneurysm, ruptured, unspecified: Secondary | ICD-10-CM

## 2018-05-19 ENCOUNTER — Encounter: Payer: Self-pay | Admitting: Nurse Practitioner

## 2018-05-19 ENCOUNTER — Ambulatory Visit (INDEPENDENT_AMBULATORY_CARE_PROVIDER_SITE_OTHER): Payer: BLUE CROSS/BLUE SHIELD | Admitting: Nurse Practitioner

## 2018-05-19 VITALS — BP 144/90 | HR 49 | Resp 16 | Ht 60.0 in | Wt 150.0 lb

## 2018-05-19 DIAGNOSIS — B373 Candidiasis of vulva and vagina: Secondary | ICD-10-CM

## 2018-05-19 DIAGNOSIS — N182 Chronic kidney disease, stage 2 (mild): Secondary | ICD-10-CM

## 2018-05-19 DIAGNOSIS — D509 Iron deficiency anemia, unspecified: Secondary | ICD-10-CM

## 2018-05-19 DIAGNOSIS — B3731 Acute candidiasis of vulva and vagina: Secondary | ICD-10-CM

## 2018-05-19 DIAGNOSIS — E559 Vitamin D deficiency, unspecified: Secondary | ICD-10-CM | POA: Diagnosis not present

## 2018-05-19 DIAGNOSIS — I712 Thoracic aortic aneurysm, without rupture, unspecified: Secondary | ICD-10-CM

## 2018-05-19 DIAGNOSIS — I779 Disorder of arteries and arterioles, unspecified: Secondary | ICD-10-CM

## 2018-05-19 DIAGNOSIS — I10 Essential (primary) hypertension: Secondary | ICD-10-CM

## 2018-05-19 DIAGNOSIS — I739 Peripheral vascular disease, unspecified: Secondary | ICD-10-CM

## 2018-05-19 MED ORDER — FLUCONAZOLE 150 MG PO TABS: ORAL_TABLET | ORAL | 0 refills | Status: DC

## 2018-05-19 MED ORDER — NYSTATIN 100000 UNIT/GM EX OINT: 1.0000 "application " | TOPICAL_OINTMENT | Freq: Two times a day (BID) | CUTANEOUS | 1 refills | Status: DC

## 2018-05-19 MED ORDER — ERGOCALCIFEROL 1.25 MG (50000 UT) PO CAPS: 50000.0000 [IU] | ORAL_CAPSULE | ORAL | 5 refills | Status: DC

## 2018-05-19 NOTE — Progress Notes (Signed)
[facility] [place], [place] [address]  Internal MEDICINE  Office Visit Note  Patient Name: Elizabeth Roy  [id number]  [id number]  Date of Service: 05/19/2018  Chief Complaint  Patient presents with  . Labs Only    ultrasound and lab review  . Hypertension    6wk follow up    The patient states that she also has some lower abdominal discomfort. Was told several years back that she did have aneurysm in abdominal aorta. She did see vein and vascular provider. Most recent scan was done in 2016 and was essentially unchanged from the prior scan. The CT did show diffuse aneurysmal dilation of thoracic aorta, extending to proximal abdominal aorta. Also showed generalized atherosclerosis of the aorta and coronary arteries.  She admits she never did follow up with vein and vascular after that. She recently had aorta duplex study. This showed that proximal abdominal aorta was dilated to 6.0cm with thrombus formation. She was sent immediately to vein and vascular. She is schedled to have CT this coming Wednesday.  We are awating results of carotid doppler study.   Neck Pain   This is a recurrent problem. The current episode started more than 1 month ago. The problem occurs intermittently. The pain is associated with nothing. The pain is present in the right side. The quality of the pain is described as burning and aching. The pain is mild. The symptoms are aggravated by stress and twisting. The pain is same all the time. Pertinent negatives include no chest pain, headaches, numbness or weakness. She has tried acetaminophen for the symptoms. The treatment provided mild relief.       Current Medication: Outpatient Encounter Medications as of 05/19/2018  Medication Sig  . amLODipine (NORVASC) 10 MG tablet Take 1 tablet (10 mg total) by mouth daily.  . bisoprolol-hydrochlorothiazide (ZIAC) 5-6.25 MG tablet Take 1 tablet by mouth daily.  Marland Kitchen olmesartan (BENICAR) 40 MG tablet Take  1 tablet (40 mg total) by mouth at bedtime.  . ergocalciferol (DRISDOL) 50000 units capsule Take 1 capsule (50,000 Units total) by mouth once a week.  . fluconazole (DIFLUCAN) 150 MG tablet Take 1 tablet po once. May repeat dose in 3 days as needed for persistent symptoms.  Marland Kitchen nystatin ointment (MYCOSTATIN) Apply 1 application topically 2 (two) times daily.  . traMADol (ULTRAM) 50 MG tablet Take 50 mg by mouth daily as needed for moderate pain.   No facility-administered encounter medications on file as of 05/19/2018.     Surgical History: Past Surgical History:  Procedure Laterality Date  . MOUTH SURGERY      Medical History: Past Medical History:  Diagnosis Date  . Arthritis   . Hypertension   . Renal disorder     Family History: Family History  Problem Relation Age of Onset  . Hypertension Mother   . Intellectual disability Sister   . Stroke Sister     Social History   Socioeconomic History  . Marital status: Widowed    Spouse name: Not on file  . Number of children: Not on file  . Years of education: Not on file  . Highest education level: Not on file  Occupational History  . Not on file  Social Needs  . Financial resource strain: Not on file  . Food insecurity:    Worry: Not on file    Inability: Not on file  . Transportation needs:    Medical: Not on file    Non-medical: Not  on file  Tobacco Use  . Smoking status: Current Some Day Smoker    Types: Cigarettes  . Smokeless tobacco: Never Used  . Tobacco comment: sometimes  Substance and Sexual Activity  . Alcohol use: Never    Frequency: Never  . Drug use: No  . Sexual activity: Not on file  Lifestyle  . Physical activity:    Days per week: Not on file    Minutes per session: Not on file  . Stress: Not on file  Relationships  . Social connections:    Talks on phone: Not on file    Gets together: Not on file    Attends religious service: Not on file    Active member of club or organization: Not on  file    Attends meetings of clubs or organizations: Not on file    Relationship status: Not on file  . Intimate partner violence:    Fear of current or ex partner: Not on file    Emotionally abused: Not on file    Physically abused: Not on file    Forced sexual activity: Not on file  Other Topics Concern  . Not on file  Social History Narrative  . Not on file      Review of Systems  Constitutional: Positive for fatigue. Negative for activity change, chills and unexpected weight change.  HENT: Negative for congestion, postnasal drip, rhinorrhea, sneezing and sore throat.   Eyes: Negative.  Negative for redness.  Respiratory: Negative for cough, chest tightness, shortness of breath and wheezing.   Cardiovascular: Negative for chest pain and palpitations.  Gastrointestinal: Negative for abdominal pain, constipation, diarrhea, nausea and vomiting.       C/o mild abdominal tenderness, right side of the abdomen. Very intermittent.   Endocrine: Negative for cold intolerance, heat intolerance, polydipsia, polyphagia and polyuria.  Genitourinary: Negative.  Negative for dysuria, frequency, urgency and vaginal pain.       Vaginal itching and irritation.   Musculoskeletal: Positive for back pain and neck pain. Negative for arthralgias and joint swelling.  Skin: Negative for rash.  Allergic/Immunologic: Negative for environmental allergies.  Neurological: Negative for dizziness, tremors, weakness, numbness and headaches.  Hematological: Negative for adenopathy. Does not bruise/bleed easily.  Psychiatric/Behavioral: Negative for behavioral problems (Depression), sleep disturbance and suicidal ideas. The patient is not nervous/anxious.    Today's Vitals   05/19/18 1001  BP: (!) 144/90  Pulse: (!) 49  Resp: 16  SpO2: 99%  Weight: 150 lb (68 kg)  Height: 5' (1.524 m)    Physical Exam  Constitutional: She is oriented to person, place, and time. She appears well-developed and  well-nourished. No distress.  HENT:  Head: Normocephalic and atraumatic.  Nose: Nose normal.  Mouth/Throat: Oropharynx is clear and moist. No oropharyngeal exudate.  Eyes: Pupils are equal, round, and reactive to light. Conjunctivae and EOM are normal.  Neck: Normal range of motion. Neck supple. No JVD present. Carotid bruit is present. No tracheal deviation present. No thyromegaly present.  Soft carotid bruit heard bilaterally.   Cardiovascular: Normal rate, regular rhythm and normal heart sounds. Exam reveals no gallop and no friction rub.  No murmur heard. Pulmonary/Chest: Effort normal and breath sounds normal. No respiratory distress. She has no wheezes. She has no rales. She exhibits no tenderness.  Abdominal: Soft. Bowel sounds are normal. There is no tenderness.  Musculoskeletal: Normal range of motion.  Lymphadenopathy:    She has no cervical adenopathy.  Neurological: She is alert and oriented  to person, place, and time. No cranial nerve deficit.  Skin: Skin is warm and dry. She is not diaphoretic.  Psychiatric: She has a normal mood and affect. Her behavior is normal. Judgment and thought content normal.  Nursing note and vitals reviewed.  Assessment/Plan: 1. Vaginal candidiasis - fluconazole (DIFLUCAN) 150 MG tablet; Take 1 tablet po once. May repeat dose in 3 days as needed for persistent symptoms.  Dispense: 3 tablet; Refill: 0 - nystatin ointment (MYCOSTATIN); Apply 1 application topically 2 (two) times daily.  Dispense: 30 g; Refill: 1  2. Vitamin D deficiency - ergocalciferol (DRISDOL) 50000 units capsule; Take 1 capsule (50,000 Units total) by mouth once a week.  Dispense: 4 capsule; Refill: 5  3. Iron deficiency anemia, unspecified iron deficiency anemia type - CBC With Differential - Iron, TIBC and Ferritin Panel - Vitamin B12  4. Thoracic aortic aneurysm without rupture (HCC) U/s showing dilation to 6cm in diameter. Vein and vascular appointments with imaging  studies as scheduled.   5. Carotid artery disease, unspecified laterality, unspecified type ([place]) Will contact patient with results when available.   6. Chronic kidney disease, stage 2, mildly decreased GFR Elevation of creatinine from baseline. Patient to see nephrology 06/04/2018 to discuss    7. Essential hypertension Generally stable. Continue bp medication as prescribed.   General Counseling: Parris verbalizes understanding of the findings of todays visit and agrees with plan of treatment. I have discussed any further diagnostic evaluation that may be needed or ordered today. We also reviewed her medications today. she has been encouraged to call the office with any questions or concerns that should arise related to todays visit.    Orders Placed This Encounter  Procedures  . CBC With Differential  . Iron, TIBC and Ferritin Panel  . Vitamin B12    Meds ordered this encounter  Medications  . ergocalciferol (DRISDOL) 50000 units capsule    Sig: Take 1 capsule (50,000 Units total) by mouth once a week.    Dispense:  4 capsule    Refill:  5    Order Specific Question:   Supervising Provider    Answer:   Lavera Guise [[id number]]  . fluconazole (DIFLUCAN) 150 MG tablet    Sig: Take 1 tablet po once. May repeat dose in 3 days as needed for persistent symptoms.    Dispense:  3 tablet    Refill:  0    Order Specific Question:   Supervising Provider    Answer:   Lavera Guise [[id number]]  . nystatin ointment (MYCOSTATIN)    Sig: Apply 1 application topically 2 (two) times daily.    Dispense:  30 g    Refill:  1    Order Specific Question:   Supervising Provider    Answer:   Lavera Guise [[id number]]    Time spent: 10 Minutes      Dr Lavera Guise Internal medicine

## 2018-05-21 ENCOUNTER — Ambulatory Visit
Admission: RE | Admit: 2018-05-21 | Discharge: 2018-05-21 | Disposition: A | Discharge status: Home / Self Care | Payer: BLUE CROSS/BLUE SHIELD | Source: Ambulatory Visit | Attending: Vascular Surgery | Admitting: Vascular Surgery

## 2018-05-21 DIAGNOSIS — I700 Atherosclerosis of aorta: Secondary | ICD-10-CM | POA: Diagnosis not present

## 2018-05-21 DIAGNOSIS — I712 Thoracic aortic aneurysm, without rupture, unspecified: Secondary | ICD-10-CM

## 2018-05-21 DIAGNOSIS — I713 Abdominal aortic aneurysm, ruptured, unspecified: Secondary | ICD-10-CM

## 2018-05-21 DIAGNOSIS — I714 Abdominal aortic aneurysm, without rupture: Secondary | ICD-10-CM | POA: Diagnosis not present

## 2018-05-22 ENCOUNTER — Telehealth: Payer: Self-pay

## 2018-05-22 NOTE — Telephone Encounter (Signed)
lmom to call us back

## 2018-05-22 NOTE — Procedures (Signed)
[place], [place] [address]  DATE OF SERVICE: May 09, 2018  CAROTID DOPPLER INTERPRETATION:  Bilateral Carotid Ultrsasound and Color Doppler Examination was performed. The RIGHT CCA shows mild plaque in the vessel. The LEFT CCA shows mild plaque in the vessel. There was no significant intimal thickening noted in the RIGHT carotid artery. There was no significant intimal thickening in the LEFT carotid artery.  The RIGHT CCA shows peak systolic velocity of 51 cm per second. The end diastolic velocity is 15 cm per second on the RIGHT side. The RIGHT ICA shows peak systolic velocity of 57 per second. RIGHT sided ICA end diastolic velocity is 20 cm per second. The RIGHT ECA shows a peak systolic velocity of 50 cm per second. The ICA/CCA ratio is calculated to be 1.1. This suggests stenosis of less than 50%. The Vertebral Artery shows antegrade flow.  The LEFT CCA shows peak systolic velocity of 33 cm per second. The end diastolic velocity is 10 cm per second on the LEFT side. The LEFT ICA shows peak systolic velocity of 52 per second. LEFT sided ICA end diastolic velocity is 25 cm per second. The LEFT ECA shows a peak systolic velocity of 38 cm per second. The ICA/CCA ratio is calculated to be 1.4. This suggests stenosis less than 50%. The Vertebral Artery shows antegrade flow.   Impression:    The RIGHT CAROTID shows no significant carotid stenosis. The LEFT CAROTID shows no significant stenosis in the vessel.  There is mild plaque formation noted on the LEFT and mild on the RIGHT  side. Consider a repeat Carotid doppler if clinical situation and symptoms warrant in 6-12 months. Patient should be encouraged to change lifestyles such as smoking cessation, regular exercise and dietary modification. Use of statins in the right clinical setting and ASA is encouraged.  Allyne Gee, MD [facility] Pulmonary Critical Care Medicine

## 2018-05-22 NOTE — Telephone Encounter (Signed)
-----   Message from Ronnell Freshwater, NP sent at 05/22/2018  4:27 PM EDT ----- Please let the patient know that carotid doppler is back. Does show plaque, but no significant obstruction. Will repeat study in about 1 year. Thanks.

## 2018-05-23 ENCOUNTER — Telehealth: Payer: Self-pay

## 2018-05-23 NOTE — Telephone Encounter (Signed)
PT WAS NOTIFIED. 

## 2018-05-23 NOTE — Telephone Encounter (Signed)
-----   Message from Ronnell Freshwater, NP sent at 05/22/2018  4:27 PM EDT ----- Please let the patient know that carotid doppler is back. Does show plaque, but no significant obstruction. Will repeat study in about 1 year. Thanks.

## 2018-05-27 ENCOUNTER — Encounter (INDEPENDENT_AMBULATORY_CARE_PROVIDER_SITE_OTHER): Payer: Self-pay | Admitting: Vascular Surgery

## 2018-05-27 ENCOUNTER — Other Ambulatory Visit: Payer: Self-pay

## 2018-05-27 ENCOUNTER — Ambulatory Visit (INDEPENDENT_AMBULATORY_CARE_PROVIDER_SITE_OTHER): Payer: BLUE CROSS/BLUE SHIELD | Admitting: Vascular Surgery

## 2018-05-27 VITALS — BP 148/100 | HR 67 | Ht 59.0 in | Wt 150.0 lb

## 2018-05-27 DIAGNOSIS — N182 Chronic kidney disease, stage 2 (mild): Secondary | ICD-10-CM

## 2018-05-27 DIAGNOSIS — I716 Thoracoabdominal aortic aneurysm, without rupture, unspecified: Secondary | ICD-10-CM

## 2018-05-27 DIAGNOSIS — F17200 Nicotine dependence, unspecified, uncomplicated: Secondary | ICD-10-CM | POA: Diagnosis not present

## 2018-05-27 NOTE — Assessment & Plan Note (Signed)
CT scan was noncontrast, but if she has a definitive repair she will need a contrasted scan more than likely.

## 2018-05-27 NOTE — Assessment & Plan Note (Signed)
She has an approximately 5.4 cm thoracoabdominal aneurysm starting in the proximal descending thoracic aorta and extending through the celiac and superior mesenteric artery with the vessel tapering down at the level of the renal vessels.  She also has some dilatation of the ascending thoracic aorta although not as severe.  I discussed the difficult nature of the situation.  I have recommended that she be seen by Dr. Sammuel Hines at [facility] who specializes in fenestrated stent grafts.  I believe she should be evaluated for that.  I have discussed that a traditional open repair would be much more morbid.  I will make the referral to him and be happy to see her at any point going forward for follow-up if needed.

## 2018-05-27 NOTE — Progress Notes (Signed)
MRN : [id number]  Elizabeth Roy is a 62 y.o. (04-Nov-1955) female who presents with chief complaint of  Chief Complaint  Patient presents with  . Follow-up    2 week follow up  .  History of Present Illness: Patient returns today in follow up of her thoracoabdominal aneurysm.  She underwent a CT scan of the chest abdomen and pelvis which I have independently reviewed.  She has an approximately 5.4 cm thoracoabdominal aneurysm starting in the proximal descending thoracic aorta and extending through the celiac and superior mesenteric artery with the vessel tapering down at the level of the renal vessels.  She also has some dilatation of the ascending thoracic aorta although not as severe.  Past Medical History:  Diagnosis Date  . Arthritis   . Hypertension   . Renal disorder          Past Surgical History:  Procedure Laterality Date  . MOUTH SURGERY           Family History  Problem Relation Age of Onset  . Hypertension Mother   . Intellectual disability Sister   . Stroke Sister   No bleeding or clotting disorders  Social History Social History       Tobacco Use  . Smoking status: Current Some Day Smoker  . Smokeless tobacco: Never Used  Substance Use Topics  . Alcohol use: Yes  . Drug use: No    No Known Allergies        Current Outpatient Medications  Medication Sig Dispense Refill  . amLODipine (NORVASC) 10 MG tablet Take 1 tablet (10 mg total) by mouth daily. 90 tablet 4  . bisoprolol-hydrochlorothiazide (ZIAC) 5-6.25 MG tablet Take 1 tablet by mouth daily. 90 tablet 4  . olmesartan (BENICAR) 40 MG tablet Take 1 tablet (40 mg total) by mouth at bedtime. 90 tablet 4  . traMADol (ULTRAM) 50 MG tablet Take 50 mg by mouth daily as needed for moderate pain.     No current facility-administered medications for this visit.       REVIEW OF SYSTEMS (Negative unless checked)  Constitutional: _0 Weight loss  _1 Fever  _2 Chills Cardiac:  _3 Chest pain   _4 Chest pressure   _5 Palpitations   _6 Shortness of breath when laying flat   _7 Shortness of breath at rest   _8 Shortness of breath with exertion. Vascular:  _9 Pain in legs with walking   _10 Pain in legs at rest   _11 Pain in legs when laying flat   _12 Claudication   _13 Pain in feet when walking  _14 Pain in feet at rest  _15 Pain in feet when laying flat   _16 History of DVT   _17 Phlebitis   _18 Swelling in legs   _19 Varicose veins   _20 Non-healing ulcers Pulmonary:   _21 Uses home oxygen   _22 Productive cough   _23 Hemoptysis   _24 Wheeze  _25 COPD   _26 Asthma Neurologic:  _27 Dizziness  _28 Blackouts   _29 Seizures   _30 History of stroke   _31 History of TIA  _32 Aphasia   _33 Temporary blindness   _34 Dysphagia   _35 Weakness or numbness in arms   _36 Weakness or numbness in legs Musculoskeletal:  _37 Arthritis   _38 Joint swelling   _39 Joint pain   _40 Low back pain Hematologic:  _41 Easy bruising  _42 Easy bleeding   _43 Hypercoagulable state   _44 Anemic  _45 Hepatitis Gastrointestinal:  _46 Blood in stool   _47 Vomiting blood  _48 Gastroesophageal reflux/heartburn   _49 Abdominal pain Genitourinary:  _50 Chronic kidney disease   _51 Difficult urination  _52 Frequent urination  _53 Burning with urination   _54 Hematuria Skin:  _55 Rashes   _56   Ulcers   _0 Wounds Psychological:  _1 History of anxiety   _2  History of major depression.    Physical Examination  BP (!) 148/100 (BP Location: Right Arm)   Pulse 67   Ht _3  (1.499 m)   Wt 150 lb (68 kg)   BMI 30.30 kg/m  Gen:  WD/WN, NAD Head: [place]/AT, No temporalis wasting. Ear/Nose/Throat: Hearing grossly intact, nares w/o erythema or drainage Eyes: Conjunctiva clear. Sclera non-icteric Neck: Supple.  Trachea midline Pulmonary:  Good air movement, no use of accessory muscles.  Cardiac: RRR, no JVD Vascular:  Vessel Right Left  Radial Palpable Palpable                          PT Palpable Palpable  DP Palpable Palpable   Gastrointestinal: soft, non-tender/non-distended.  Slight increased  aortic impulse Musculoskeletal: M/S 5/5 throughout.  No deformity or atrophy.  Trace lower extremity edema. Neurologic: Sensation grossly intact in extremities.  Symmetrical.  Speech is fluent.  Psychiatric: Judgment intact, Mood & affect appropriate for pt's clinical situation. Dermatologic: No rashes or ulcers noted.  No cellulitis or open wounds.       Labs Recent Results (from the past 2160 hour(s))  VITAMIN D 25 Hydroxy (Vit-D Deficiency, Fractures)     Status: Abnormal   Collection Time: 04/15/18 11:43 AM  Result Value Ref Range   Vit D, 25-Hydroxy 11.9 (L) 30.0 - 100.0 ng/mL    Comment: (NOTE) Vitamin D deficiency has been defined by the [place] practice guideline as a level of serum 25-OH vitamin D less than 20 ng/mL (1,2). The Endocrine Society went on to further define vitamin D insufficiency as a level between 21 and 29 ng/mL (2). 1. IOM (Institute of Medicine). 2010. Dietary reference   intakes for calcium and D. [place]: The   [employer]. 2. Holick MF, Binkley Wendell, Bischoff-Ferrari HA, et al.   Evaluation, treatment, and prevention of vitamin D   deficiency: an Endocrine Society clinical practice   guideline. JCEM. 2011 Jul; 96(7):1911-30. Performed At: [facility] [place], [place] [id number] Rush Farmer MD [id number] Performed at [facility], [address] [place], [place] [address]   Lipid panel     Status: Abnormal   Collection Time: 04/15/18 11:43 AM  Result Value Ref Range   Cholesterol 183 0 - 200 mg/dL   Triglycerides 87 <150 mg/dL   HDL 58 >40 mg/dL   Total CHOL/HDL Ratio 3.2 RATIO   VLDL 17 0 - 40 mg/dL   LDL Cholesterol 108 (H) 0 - 99 mg/dL    Comment:        Total Cholesterol/HDL:CHD Risk Coronary Heart Disease Risk Table                     Men   Women  1/2 Average Risk   3.4   3.3  Average Risk       5.0   4.4  2 X Average Risk   9.6   7.1   3 X Average Risk  23.4   11.0        Use the calculated Patient Ratio above and the CHD Risk Table to determine the patient's CHD Risk.        ATP III CLASSIFICATION (LDL):  <100     mg/dL   Optimal  100-129  mg/dL   Near or Above  Optimal  130-159  mg/dL   Borderline  160-189  mg/dL   High  >190     mg/dL   Very High Performed at [facility], [address] [place], [place] [address]   TSH     Status: None   Collection Time: 04/15/18 11:43 AM  Result Value Ref Range   TSH 1.306 0.350 - 4.500 uIU/mL    Comment: Performed by a 3rd Generation assay with a functional sensitivity of <=0.01 uIU/mL. Performed at [facility], [address] [place], [place] [address]   T4, free     Status: Abnormal   Collection Time: 04/15/18 11:43 AM  Result Value Ref Range   Free T4 0.80 (L) 0.82 - 1.77 ng/dL    Comment: (NOTE) Biotin ingestion may interfere with free T4 tests. If the results are inconsistent with the TSH level, previous test results, or the clinical presentation, then consider biotin interference. If needed, order repeat testing after stopping biotin. Performed at [facility], [address] [place], [place] [address]   Comprehensive metabolic panel     Status: Abnormal   Collection Time: 04/15/18 11:43 AM  Result Value Ref Range   Sodium 142 135 - 145 mmol/L   Potassium 4.8 3.5 - 5.1 mmol/L   Chloride 111 98 - 111 mmol/L    Comment: Please note change in reference range.   CO2 23 22 - 32 mmol/L   Glucose, Bld 88 70 - 99 mg/dL    Comment: Please note change in reference range.   BUN 37 (H) 8 - 23 mg/dL    Comment: Please note change in reference range.   Creatinine, Ser 2.52 (H) 0.44 - 1.00 mg/dL   Calcium 10.1 8.9 - 10.3 mg/dL   Total Protein 7.4 6.5 - 8.1 g/dL   Albumin 4.1 3.5 - 5.0 g/dL   AST 14 (L) 15 - 41 U/L   ALT 9 0 - 44 U/L    Comment: Please note change in reference range.   Alkaline Phosphatase 59 38 -  126 U/L   Total Bilirubin 0.6 0.3 - 1.2 mg/dL   GFR calc non Af Amer 19 (L) >60 mL/min   GFR calc Af Amer 22 (L) >60 mL/min    Comment: (NOTE) The eGFR has been calculated using the CKD EPI equation. This calculation has not been validated in all clinical situations. eGFR's persistently <60 mL/min signify possible Chronic Kidney Disease.    Anion gap 8 5 - 15    Comment: Performed at [facility], [address] [place], [place] [address]  CBC with Differential/Platelet     Status: Abnormal   Collection Time: 04/15/18 11:43 AM  Result Value Ref Range   WBC 5.4 3.6 - 11.0 K/uL   RBC 3.97 3.80 - 5.20 MIL/uL   Hemoglobin 11.3 (L) 12.0 - 16.0 g/dL   HCT 34.5 (L) 35.0 - 47.0 %   MCV 86.7 80.0 - 100.0 fL   MCH 28.5 26.0 - 34.0 pg   MCHC 32.9 32.0 - 36.0 g/dL   RDW 15.2 (H) 11.5 - 14.5 %   Platelets 316 150 - 440 K/uL   Neutrophils Relative % 66 %   Neutro Abs 3.6 1.4 - 6.5 K/uL   Lymphocytes Relative 20 %   Lymphs Abs 1.1 1.0 - 3.6 K/uL   Monocytes Relative 8 %   Monocytes Absolute 0.4 0.2 - 0.9 K/uL   Eosinophils Relative 5 %   Eosinophils Absolute 0.2 0 -  0.7 K/uL   Basophils Relative 1 %   Basophils Absolute 0.0 0 - 0.1 K/uL    Comment: Performed at [facility], [address] [place], [place] [address]    Radiology Ct Abdomen Pelvis Wo Contrast  Result Date: 05/21/2018 CLINICAL DATA:  Abdominal aortic aneurysm. EXAM: CT CHEST, ABDOMEN AND PELVIS WITHOUT CONTRAST TECHNIQUE: Multidetector CT imaging of the chest, abdomen and pelvis was performed following the standard protocol without IV contrast. COMPARISON:  CT scan of November 18, 2014. FINDINGS: CT CHEST FINDINGS Cardiovascular: Ascending thoracic aortic aneurysm is noted measuring 4.2 cm. Transverse aortic arch measures 3.5 cm per midportion of descending thoracic aorta measures 5.0 cm which is enlarged compared to prior exam. Distal portion of descending thoracic aorta just above aortic hiatus measures  5.4 cm which is increased in size compared to prior exam. Minimal coronary artery calcifications are noted. Normal cardiac size. No pericardial effusion is noted. Mediastinum/Nodes: No enlarged mediastinal, hilar, or axillary lymph nodes. Thyroid gland, trachea, and esophagus demonstrate no significant findings. Lungs/Pleura: Lungs are clear. No pleural effusion or pneumothorax. Musculoskeletal: No chest wall mass or suspicious bone lesions identified. CT ABDOMEN PELVIS FINDINGS Hepatobiliary: No focal liver abnormality is seen. No gallstones, gallbladder wall thickening, or biliary dilatation. Pancreas: Unremarkable. No pancreatic ductal dilatation or surrounding inflammatory changes. Spleen: Normal in size without focal abnormality. Adrenals/Urinary Tract: Adrenal glands appear normal. Bilateral renal cysts are noted. No renal or ureteral calculi are noted. Urinary bladder is decompressed. Stomach/Bowel: Stomach is within normal limits. Appendix appears normal. No evidence of bowel wall thickening, distention, or inflammatory changes. Vascular/Lymphatic: Atherosclerosis of abdominal aorta is noted. Maximum measured AP diameter of suprarenal abdominal aorta is 4.7 cm which is significantly increased in size compared to prior exam. Infrarenal abdominal aorta is normal in caliber. No significant adenopathy is noted. Reproductive: Uterus and bilateral adnexa are unremarkable. Other: No abdominal wall hernia or abnormality. No abdominopelvic ascites. Musculoskeletal: No acute or significant osseous findings. IMPRESSION: 4.2 cm ascending thoracic aortic aneurysm. Distal descending thoracic aortic aneurysm measures 5.4 cm which is significantly increased in size compared to prior exam. This extends into suprarenal abdominal aorta, which has maximum measured diameter of 4.7 cm which is also significantly increased in size compared to prior exam. Infrarenal abdominal aorta is normal in caliber. Recommend followup by  abdomen and pelvis CTA in 6 months, and vascular surgery referral/consultation if not already obtained. This recommendation follows ACR consensus guidelines: White Paper of the ACR Incidental Findings Committee II on Vascular Findings. J Am Coll Radiol 2013; 10:789-794. Dissection cannot be excluded due to the lack of intravenous contrast. Aortic Atherosclerosis (ICD10-I70.0). Electronically Signed   By: Marijo Conception, M.D.   On: 05/21/2018 15:38   Ct Chest Wo Contrast  Result Date: 05/21/2018 CLINICAL DATA:  Abdominal aortic aneurysm. EXAM: CT CHEST, ABDOMEN AND PELVIS WITHOUT CONTRAST TECHNIQUE: Multidetector CT imaging of the chest, abdomen and pelvis was performed following the standard protocol without IV contrast. COMPARISON:  CT scan of November 18, 2014. FINDINGS: CT CHEST FINDINGS Cardiovascular: Ascending thoracic aortic aneurysm is noted measuring 4.2 cm. Transverse aortic arch measures 3.5 cm per midportion of descending thoracic aorta measures 5.0 cm which is enlarged compared to prior exam. Distal portion of descending thoracic aorta just above aortic hiatus measures 5.4 cm which is increased in size compared to prior exam. Minimal coronary artery calcifications are noted. Normal cardiac size. No pericardial effusion is noted. Mediastinum/Nodes: No enlarged mediastinal, hilar, or axillary lymph nodes. Thyroid  gland, trachea, and esophagus demonstrate no significant findings. Lungs/Pleura: Lungs are clear. No pleural effusion or pneumothorax. Musculoskeletal: No chest wall mass or suspicious bone lesions identified. CT ABDOMEN PELVIS FINDINGS Hepatobiliary: No focal liver abnormality is seen. No gallstones, gallbladder wall thickening, or biliary dilatation. Pancreas: Unremarkable. No pancreatic ductal dilatation or surrounding inflammatory changes. Spleen: Normal in size without focal abnormality. Adrenals/Urinary Tract: Adrenal glands appear normal. Bilateral renal cysts are noted. No renal or  ureteral calculi are noted. Urinary bladder is decompressed. Stomach/Bowel: Stomach is within normal limits. Appendix appears normal. No evidence of bowel wall thickening, distention, or inflammatory changes. Vascular/Lymphatic: Atherosclerosis of abdominal aorta is noted. Maximum measured AP diameter of suprarenal abdominal aorta is 4.7 cm which is significantly increased in size compared to prior exam. Infrarenal abdominal aorta is normal in caliber. No significant adenopathy is noted. Reproductive: Uterus and bilateral adnexa are unremarkable. Other: No abdominal wall hernia or abnormality. No abdominopelvic ascites. Musculoskeletal: No acute or significant osseous findings. IMPRESSION: 4.2 cm ascending thoracic aortic aneurysm. Distal descending thoracic aortic aneurysm measures 5.4 cm which is significantly increased in size compared to prior exam. This extends into suprarenal abdominal aorta, which has maximum measured diameter of 4.7 cm which is also significantly increased in size compared to prior exam. Infrarenal abdominal aorta is normal in caliber. Recommend followup by abdomen and pelvis CTA in 6 months, and vascular surgery referral/consultation if not already obtained. This recommendation follows ACR consensus guidelines: White Paper of the ACR Incidental Findings Committee II on Vascular Findings. J Am Coll Radiol 2013; 10:789-794. Dissection cannot be excluded due to the lack of intravenous contrast. Aortic Atherosclerosis (ICD10-I70.0). Electronically Signed   By: Marijo Conception, M.D.   On: 05/21/2018 15:38   US Carotid Duplex Bilateral  Result Date: 05/09/2018 Allyne Gee, MD     05/22/2018  3:23 PM [place], [place] [address] DATE OF SERVICE: May 09, 2018 CAROTID DOPPLER INTERPRETATION: Bilateral Carotid Ultrsasound and Color Doppler Examination was performed. The RIGHT CCA shows mild plaque in the vessel. The LEFT CCA shows mild plaque in the vessel.  There was no significant intimal thickening noted in the RIGHT carotid artery. There was no significant intimal thickening in the LEFT carotid artery. The RIGHT CCA shows peak systolic velocity of 51 cm per second. The end diastolic velocity is 15 cm per second on the RIGHT side. The RIGHT ICA shows peak systolic velocity of 57 per second. RIGHT sided ICA end diastolic velocity is 20 cm per second. The RIGHT ECA shows a peak systolic velocity of 50 cm per second. The ICA/CCA ratio is calculated to be 1.1. This suggests stenosis of less than 50%. The Vertebral Artery shows antegrade flow. The LEFT CCA shows peak systolic velocity of 33 cm per second. The end diastolic velocity is 10 cm per second on the LEFT side. The LEFT ICA shows peak systolic velocity of 52 per second. LEFT sided ICA end diastolic velocity is 25 cm per second. The LEFT ECA shows a peak systolic velocity of 38 cm per second. The ICA/CCA ratio is calculated to be 1.4. This suggests stenosis less than 50%. The Vertebral Artery shows antegrade flow. Impression:  The RIGHT CAROTID shows no significant carotid stenosis. The LEFT CAROTID shows no significant stenosis in the vessel.  There is mild plaque formation noted on the LEFT and mild on the RIGHT  side. Consider a repeat Carotid doppler if clinical situation and symptoms warrant in 6-12 months.  Patient should be encouraged to change lifestyles such as smoking cessation, regular exercise and dietary modification. Use of statins in the right clinical setting and ASA is encouraged. Allyne Gee, MD [facility]    Assessment/Plan Essential hypertension blood pressure control important in reducing the progression of atherosclerotic disease and aneurysmal degeneration. On appropriate oral medications.  Chronic kidney disease, stage 2, mildly decreased GFR CT scan was noncontrast, but if she has a definitive repair she will need a contrasted scan more than  likely.  Thoracoabdominal aneurysm [facility]) She has an approximately 5.4 cm thoracoabdominal aneurysm starting in the proximal descending thoracic aorta and extending through the celiac and superior mesenteric artery with the vessel tapering down at the level of the renal vessels.  She also has some dilatation of the ascending thoracic aorta although not as severe.  I discussed the difficult nature of the situation.  I have recommended that she be seen by Dr. Sammuel Hines at [facility] who specializes in fenestrated stent grafts.  I believe she should be evaluated for that.  I have discussed that a traditional open repair would be much more morbid.  I will make the referral to him and be happy to see her at any point going forward for follow-up if needed.    Leotis Pain, MD  05/27/2018 10:46 AM    This note was created with Dragon medical transcription system.  Any errors from dictation are purely unintentional

## 2018-06-04 DIAGNOSIS — R600 Localized edema: Secondary | ICD-10-CM | POA: Diagnosis not present

## 2018-06-04 DIAGNOSIS — I10 Essential (primary) hypertension: Secondary | ICD-10-CM | POA: Diagnosis not present

## 2018-06-04 DIAGNOSIS — N184 Chronic kidney disease, stage 4 (severe): Secondary | ICD-10-CM | POA: Diagnosis not present

## 2018-06-04 DIAGNOSIS — R809 Proteinuria, unspecified: Secondary | ICD-10-CM | POA: Diagnosis not present

## 2018-07-03 DIAGNOSIS — Z6830 Body mass index (BMI) 30.0-30.9, adult: Secondary | ICD-10-CM | POA: Diagnosis not present

## 2018-07-03 DIAGNOSIS — I10 Essential (primary) hypertension: Secondary | ICD-10-CM | POA: Diagnosis not present

## 2018-07-03 DIAGNOSIS — I716 Thoracoabdominal aortic aneurysm, without rupture: Secondary | ICD-10-CM | POA: Diagnosis not present

## 2018-07-03 DIAGNOSIS — I712 Thoracic aortic aneurysm, without rupture: Secondary | ICD-10-CM | POA: Diagnosis not present

## 2018-07-03 DIAGNOSIS — Z720 Tobacco use: Secondary | ICD-10-CM | POA: Diagnosis not present

## 2018-08-05 DIAGNOSIS — R600 Localized edema: Secondary | ICD-10-CM | POA: Diagnosis not present

## 2018-08-05 DIAGNOSIS — I129 Hypertensive chronic kidney disease with stage 1 through stage 4 chronic kidney disease, or unspecified chronic kidney disease: Secondary | ICD-10-CM | POA: Diagnosis not present

## 2018-08-05 DIAGNOSIS — N184 Chronic kidney disease, stage 4 (severe): Secondary | ICD-10-CM | POA: Diagnosis not present

## 2018-09-02 ENCOUNTER — Ambulatory Visit (INDEPENDENT_AMBULATORY_CARE_PROVIDER_SITE_OTHER): Payer: BLUE CROSS/BLUE SHIELD | Admitting: Nurse Practitioner

## 2018-09-02 ENCOUNTER — Encounter: Payer: Self-pay | Admitting: Nurse Practitioner

## 2018-09-02 VITALS — BP 131/97 | HR 74 | Resp 16 | Ht 59.0 in | Wt 153.2 lb

## 2018-09-02 DIAGNOSIS — I10 Essential (primary) hypertension: Secondary | ICD-10-CM

## 2018-09-02 DIAGNOSIS — Z124 Encounter for screening for malignant neoplasm of cervix: Secondary | ICD-10-CM | POA: Diagnosis not present

## 2018-09-02 DIAGNOSIS — I712 Thoracic aortic aneurysm, without rupture, unspecified: Secondary | ICD-10-CM

## 2018-09-02 DIAGNOSIS — R300 Dysuria: Secondary | ICD-10-CM | POA: Diagnosis not present

## 2018-09-02 DIAGNOSIS — Z1239 Encounter for other screening for malignant neoplasm of breast: Secondary | ICD-10-CM

## 2018-09-02 DIAGNOSIS — Z0001 Encounter for general adult medical examination with abnormal findings: Secondary | ICD-10-CM | POA: Diagnosis not present

## 2018-09-02 MED ORDER — OLMESARTAN MEDOXOMIL 40 MG PO TABS: 40.0000 mg | ORAL_TABLET | Freq: Every day | ORAL | 3 refills | Status: DC

## 2018-09-02 NOTE — Progress Notes (Signed)
[facility] [place], [place] [address]  Internal MEDICINE  Office Visit Note  Patient Name: Elizabeth Roy  [id number]  [id number]  Date of Service: 09/03/2018   Pt is here for routine health maintenance examination  Chief Complaint  Patient presents with  . Annual Exam  . Gynecologic Exam  . Quality Metric Gaps    mammogram     The patient is here for routine health maintenance exam. She has been evaluated per vein and vascular due to abdominal aortic aneurysm. She will be reassessed in March. Vascular surgeon unwilling to do surgical repair at this time because the patient has reduced renal functions. She is followed by nephrology.     Current Medication: Outpatient Encounter Medications as of 09/02/2018  Medication Sig  . amLODipine (NORVASC) 10 MG tablet Take 1 tablet (10 mg total) by mouth daily.  . bisoprolol-hydrochlorothiazide (ZIAC) 5-6.25 MG tablet Take 1 tablet by mouth daily.  . ergocalciferol (DRISDOL) 50000 units capsule Take 1 capsule (50,000 Units total) by mouth once a week.  . fluconazole (DIFLUCAN) 150 MG tablet Take 1 tablet po once. May repeat dose in 3 days as needed for persistent symptoms.  Marland Kitchen nystatin ointment (MYCOSTATIN) Apply 1 application topically 2 (two) times daily.  Marland Kitchen olmesartan (BENICAR) 40 MG tablet Take 1 tablet (40 mg total) by mouth at bedtime.  . torsemide (DEMADEX) 20 MG tablet Take 1 tablet by mouth daily.  . traMADol (ULTRAM) 50 MG tablet Take 50 mg by mouth daily as needed for moderate pain.  . [DISCONTINUED] olmesartan (BENICAR) 40 MG tablet Take 1 tablet (40 mg total) by mouth at bedtime.   No facility-administered encounter medications on file as of 09/02/2018.     Surgical History: Past Surgical History:  Procedure Laterality Date  . MOUTH SURGERY      Medical History: Past Medical History:  Diagnosis Date  . Arthritis   . Hypertension   . Renal disorder     Family History: Family History   Problem Relation Age of Onset  . Hypertension Mother   . Intellectual disability Sister   . Stroke Sister       Review of Systems  Constitutional: Negative for activity change, chills, fatigue and unexpected weight change.  HENT: Negative for congestion, postnasal drip, rhinorrhea, sneezing and sore throat.   Respiratory: Negative for cough, chest tightness, shortness of breath and wheezing.   Cardiovascular: Negative for chest pain and palpitations.  Gastrointestinal: Negative for abdominal pain, constipation, diarrhea, nausea and vomiting.  Endocrine: Negative for cold intolerance, heat intolerance, polydipsia, polyphagia and polyuria.  Genitourinary: Negative for dysuria, frequency, hematuria and urgency.  Musculoskeletal: Negative for arthralgias, back pain, joint swelling and neck pain.  Skin: Negative for rash.  Allergic/Immunologic: Negative for environmental allergies.  Neurological: Negative for dizziness, tremors, numbness and headaches.  Hematological: Negative for adenopathy. Does not bruise/bleed easily.  Psychiatric/Behavioral: Negative for behavioral problems (Depression), sleep disturbance and suicidal ideas. The patient is not nervous/anxious.      Today's Vitals   09/02/18 1102  BP: (!) 131/97  Pulse: 74  Resp: 16  SpO2: 92%  Weight: 153 lb 3.2 oz (69.5 kg)  Height: 4\' 11"  (1.499 m)    Physical Exam  Constitutional: She is oriented to person, place, and time. She appears well-developed and well-nourished. No distress.  HENT:  Head: Normocephalic and atraumatic.  Nose: Nose normal.  Mouth/Throat: No oropharyngeal exudate.  Eyes: Pupils are equal, round, and reactive to light. EOM are  normal.  Neck: Normal range of motion. Neck supple. No JVD present. No tracheal deviation present. No thyromegaly present.  Cardiovascular: Normal rate, regular rhythm, normal heart sounds and intact distal pulses. Exam reveals no gallop and no friction rub.  No murmur  heard. Pulmonary/Chest: Effort normal and breath sounds normal. No respiratory distress. She has no wheezes. She has no rales. She exhibits no tenderness. Right breast exhibits no inverted nipple, no mass, no nipple discharge, no skin change and no tenderness. Left breast exhibits no inverted nipple, no mass, no nipple discharge, no skin change and no tenderness.  Abdominal: Soft. Bowel sounds are normal. There is no tenderness.  Genitourinary: Vagina normal and uterus normal.  Genitourinary Comments: No tenderness, masses, or organomeglay present during bimanual exam .  Musculoskeletal: Normal range of motion.  Lymphadenopathy:    She has no cervical adenopathy.  Neurological: She is alert and oriented to person, place, and time. No cranial nerve deficit.  Skin: Skin is warm and dry. She is not diaphoretic.  Psychiatric: She has a normal mood and affect. Her behavior is normal. Judgment and thought content normal.  Nursing note and vitals reviewed.  Assessment/Plan: 1. Encounter for general adult medical examination with abnormal findings Annual wellness visit today.  2. Essential hypertension Stable. Continue BP medication as prescribed. olmesartan added back per nephrology.  - olmesartan (BENICAR) 40 MG tablet; Take 1 tablet (40 mg total) by mouth at bedtime.  Dispense: 90 tablet; Refill: 3  3. Thoracic aortic aneurysm without rupture ([place]) Continue regular visits with vein and vascular as scheduled for continued monitoring.   4. Screening for malignant neoplasm of cervix - Pap IG and HPV (high risk) DNA detection  5. Screening for breast cancer - MM DIGITAL SCREENING BILATERAL; Future  6. Dysuria - UA/M w/rflx Culture, Routine  General Counseling: Elizabeth Roy verbalizes understanding of the findings of todays visit and agrees with plan of treatment. I have discussed any further diagnostic evaluation that may be needed or ordered today. We also reviewed her medications today. she has  been encouraged to call the office with any questions or concerns that should arise related to todays visit.    Counseling:  Hypertension Counseling:   The following hypertensive lifestyle modification were recommended and discussed:  1. Limiting alcohol intake to less than 1 oz/day of ethanol:(24 oz of beer or 8 oz of wine or 2 oz of 100-proof whiskey). 2. Take baby ASA 81 mg daily. 3. Importance of regular aerobic exercise and losing weight. 4. Reduce dietary saturated fat and cholesterol intake for overall cardiovascular health. 5. Maintaining adequate dietary potassium, calcium, and magnesium intake. 6. Regular monitoring of the blood pressure. 7. Reduce sodium intake to less than 100 mmol/day (less than 2.3 gm of sodium or less than 6 gm of sodium choride)   This patient was seen by [place] with Dr Lavera Guise as a part of collaborative care agreement  Orders Placed This Encounter  Procedures  . Microscopic Examination  . MM DIGITAL SCREENING BILATERAL  . UA/M w/rflx Culture, Routine    Meds ordered this encounter  Medications  . olmesartan (BENICAR) 40 MG tablet    Sig: Take 1 tablet (40 mg total) by mouth at bedtime.    Dispense:  90 tablet    Refill:  3    Order Specific Question:   Supervising Provider    Answer:   Lavera Guise [[id number]]    Time spent: 45 Minutes  Lavera Guise, MD  Internal Medicine

## 2018-09-03 DIAGNOSIS — Z124 Encounter for screening for malignant neoplasm of cervix: Secondary | ICD-10-CM | POA: Insufficient documentation

## 2018-09-03 DIAGNOSIS — R300 Dysuria: Secondary | ICD-10-CM | POA: Insufficient documentation

## 2018-09-03 DIAGNOSIS — Z1239 Encounter for other screening for malignant neoplasm of breast: Secondary | ICD-10-CM | POA: Insufficient documentation

## 2018-09-03 LAB — UA/M W/RFLX CULTURE, ROUTINE
Bilirubin, UA: NEGATIVE
Glucose, UA: NEGATIVE
Ketones, UA: NEGATIVE
Leukocytes, UA: NEGATIVE
Nitrite, UA: NEGATIVE
RBC, UA: NEGATIVE
Specific Gravity, UA: 1.013 (ref 1.005–1.030)
Urobilinogen, Ur: 0.2 mg/dL (ref 0.2–1.0)
pH, UA: 5 (ref 5.0–7.5)

## 2018-09-03 LAB — MICROSCOPIC EXAMINATION
Bacteria, UA: NONE SEEN
Casts: NONE SEEN /lpf

## 2018-09-05 ENCOUNTER — Telehealth: Payer: Self-pay

## 2018-09-05 LAB — PAP IG AND HPV HIGH-RISK: HPV, high-risk: NEGATIVE

## 2018-09-05 NOTE — Telephone Encounter (Signed)
Left a message for pt to call back for her pap results

## 2018-09-05 NOTE — Telephone Encounter (Signed)
-----   Message from Ronnell Freshwater, NP sent at 09/05/2018  3:26 PM EST ----- Please let the patient know that her pap smear is normal. Thanks.

## 2018-09-15 ENCOUNTER — Telehealth: Payer: Self-pay

## 2018-09-15 NOTE — Telephone Encounter (Signed)
Left a message, 2nd attempt for pt to call back for pap results, mailing out a result letter.

## 2018-09-25 ENCOUNTER — Telehealth: Payer: Self-pay

## 2018-09-25 NOTE — Telephone Encounter (Signed)
Pt advised pap smear  Is normal

## 2018-11-07 DIAGNOSIS — R600 Localized edema: Secondary | ICD-10-CM | POA: Diagnosis not present

## 2018-11-07 DIAGNOSIS — I129 Hypertensive chronic kidney disease with stage 1 through stage 4 chronic kidney disease, or unspecified chronic kidney disease: Secondary | ICD-10-CM | POA: Diagnosis not present

## 2018-11-07 DIAGNOSIS — N184 Chronic kidney disease, stage 4 (severe): Secondary | ICD-10-CM | POA: Diagnosis not present

## 2019-01-08 DIAGNOSIS — M533 Sacrococcygeal disorders, not elsewhere classified: Secondary | ICD-10-CM | POA: Diagnosis not present

## 2019-01-08 DIAGNOSIS — M479 Spondylosis, unspecified: Secondary | ICD-10-CM | POA: Diagnosis not present

## 2019-01-08 DIAGNOSIS — I716 Thoracoabdominal aortic aneurysm, without rupture: Secondary | ICD-10-CM | POA: Diagnosis not present

## 2019-02-04 ENCOUNTER — Encounter: Payer: Self-pay | Admitting: Nurse Practitioner

## 2019-02-12 DIAGNOSIS — I716 Thoracoabdominal aortic aneurysm, without rupture: Secondary | ICD-10-CM | POA: Diagnosis not present

## 2019-03-02 ENCOUNTER — Encounter: Payer: Self-pay | Admitting: Nurse Practitioner

## 2019-03-02 ENCOUNTER — Ambulatory Visit: Payer: PPO | Admitting: Nurse Practitioner

## 2019-03-02 ENCOUNTER — Other Ambulatory Visit: Payer: Self-pay

## 2019-03-02 DIAGNOSIS — N764 Abscess of vulva: Secondary | ICD-10-CM

## 2019-03-02 DIAGNOSIS — I10 Essential (primary) hypertension: Secondary | ICD-10-CM | POA: Diagnosis not present

## 2019-03-02 DIAGNOSIS — N182 Chronic kidney disease, stage 2 (mild): Secondary | ICD-10-CM | POA: Diagnosis not present

## 2019-03-02 MED ORDER — SULFAMETHOXAZOLE-TRIMETHOPRIM 800-160 MG PO TABS
1.0000 | ORAL_TABLET | Freq: Two times a day (BID) | ORAL | 0 refills | Status: DC
Start: 2019-03-02 — End: 2019-05-11

## 2019-03-02 NOTE — Progress Notes (Signed)
[facility] [place], [place] [address]  Internal MEDICINE  Telephone Visit  Patient Name: Elizabeth Roy  [id number]  [id number]  Date of Service: 03/17/2019  I connected with the patient at 4:22pm by telephone and verified the patients identity using two identifiers.   I discussed the limitations, risks, security and privacy concerns of performing an evaluation and management service by telephone and the availability of in person appointments. I also discussed with the patient that there may be a patient responsible charge related to the service.  The patient expressed understanding and agrees to proceed.    Chief Complaint  Patient presents with  . Telephone Assessment  . Telephone Screen  . Hypertension  . Vaginitis    The patient has been contacted via telephone for follow up visit due to concerns for spread of novel coronavirus. The patient is c/o boil type lesion around the left vaginal area. Developed into tender lesion which did rupture. Had some drainage. Got a little better, then lesion seems to have redeveloped.       Current Medication: Outpatient Encounter Medications as of 03/02/2019  Medication Sig  . ergocalciferol (DRISDOL) 50000 units capsule Take 1 capsule (50,000 Units total) by mouth once a week.  . fluconazole (DIFLUCAN) 150 MG tablet Take 1 tablet po once. May repeat dose in 3 days as needed for persistent symptoms.  Marland Kitchen nystatin ointment (MYCOSTATIN) Apply 1 application topically 2 (two) times daily.  Marland Kitchen olmesartan (BENICAR) 40 MG tablet Take 1 tablet (40 mg total) by mouth at bedtime.  . torsemide (DEMADEX) 20 MG tablet Take 1 tablet by mouth daily.  . traMADol (ULTRAM) 50 MG tablet Take 50 mg by mouth daily as needed for moderate pain.  . [DISCONTINUED] amLODipine (NORVASC) 10 MG tablet Take 1 tablet (10 mg total) by mouth daily.  . [DISCONTINUED] bisoprolol-hydrochlorothiazide (ZIAC) 5-6.25 MG tablet Take 1 tablet by mouth daily.  Marland Kitchen  sulfamethoxazole-trimethoprim (BACTRIM DS) 800-160 MG tablet Take 1 tablet by mouth 2 (two) times daily.   No facility-administered encounter medications on file as of 03/02/2019.     Surgical History: Past Surgical History:  Procedure Laterality Date  . MOUTH SURGERY      Medical History: Past Medical History:  Diagnosis Date  . Arthritis   . Hypertension   . Renal disorder     Family History: Family History  Problem Relation Age of Onset  . Hypertension Mother   . Intellectual disability Sister   . Stroke Sister     Social History   Socioeconomic History  . Marital status: Widowed    Spouse name: Not on file  . Number of children: Not on file  . Years of education: Not on file  . Highest education level: Not on file  Occupational History  . Not on file  Social Needs  . Financial resource strain: Not on file  . Food insecurity:    Worry: Not on file    Inability: Not on file  . Transportation needs:    Medical: Not on file    Non-medical: Not on file  Tobacco Use  . Smoking status: Current Some Day Smoker    Types: Cigarettes  . Smokeless tobacco: Never Used  . Tobacco comment: sometimes  Substance and Sexual Activity  . Alcohol use: Never    Frequency: Never  . Drug use: No  . Sexual activity: Not on file  Lifestyle  . Physical activity:    Days per week: Not on  file    Minutes per session: Not on file  . Stress: Not on file  Relationships  . Social connections:    Talks on phone: Not on file    Gets together: Not on file    Attends religious service: Not on file    Active member of club or organization: Not on file    Attends meetings of clubs or organizations: Not on file    Relationship status: Not on file  . Intimate partner violence:    Fear of current or ex partner: Not on file    Emotionally abused: Not on file    Physically abused: Not on file    Forced sexual activity: Not on file  Other Topics Concern  . Not on file  Social History  Narrative  . Not on file      Review of Systems  Constitutional: Negative for activity change, chills, fatigue and unexpected weight change.  HENT: Negative for congestion, postnasal drip, rhinorrhea, sneezing and sore throat.   Respiratory: Negative for cough, chest tightness, shortness of breath and wheezing.   Cardiovascular: Negative for chest pain and palpitations.  Gastrointestinal: Negative for abdominal pain, constipation, diarrhea, nausea and vomiting.  Endocrine: Negative for cold intolerance, heat intolerance, polydipsia, polyphagia and polyuria.  Genitourinary: Positive for vaginal pain. Negative for dysuria, frequency, hematuria and urgency.       Abscess type lesion on vaginal area. Swollen and very tender. Hurts to sit for even a few minutes.   Musculoskeletal: Negative for arthralgias, back pain, joint swelling and neck pain.  Skin: Negative for rash.  Allergic/Immunologic: Negative for environmental allergies.  Neurological: Negative for dizziness, tremors, numbness and headaches.  Hematological: Negative for adenopathy. Does not bruise/bleed easily.  Psychiatric/Behavioral: Negative for behavioral problems (Depression), sleep disturbance and suicidal ideas. The patient is not nervous/anxious.     Vital Signs: There were no vitals taken for this visit.   Observation/Objective:   The patient is alert and oriented. She is pleasant and answers all questions appropriately. Breathing is non-labored. She is in no acute distress at this time. She sounds uncomfortable and in pain.    Assessment/Plan: 1. Vulvar furuncle Start bactrim DS twice daily for 10 days. Advised her to apply warm compress to affected area to help promote drainage. Advised her to contact the office if no improvement over next few days.  - sulfamethoxazole-trimethoprim (BACTRIM DS) 800-160 MG tablet; Take 1 tablet by mouth 2 (two) times daily.  Dispense: 20 tablet; Refill: 0  2. Essential  hypertension Stable. Continue BP medication as prescribed   3. Chronic kidney disease, stage 2, mildly decreased GFR Continue regular visits with nephrology as scheduled.   General Counseling: Elizabeth Roy verbalizes understanding of the findings of today's phone visit and agrees with plan of treatment. I have discussed any further diagnostic evaluation that may be needed or ordered today. We also reviewed her medications today. she has been encouraged to call the office with any questions or concerns that should arise related to todays visit.  This patient was seen by [place] with Dr Lavera Guise as a part of collaborative care agreement  Meds ordered this encounter  Medications  . sulfamethoxazole-trimethoprim (BACTRIM DS) 800-160 MG tablet    Sig: Take 1 tablet by mouth 2 (two) times daily.    Dispense:  20 tablet    Refill:  0    Order Specific Question:   Supervising Provider    Answer:   Lavera Guise [  [id number]]    Time spent: [place] Internal medicine

## 2019-03-17 ENCOUNTER — Other Ambulatory Visit: Payer: Self-pay

## 2019-03-17 DIAGNOSIS — I10 Essential (primary) hypertension: Secondary | ICD-10-CM

## 2019-03-17 DIAGNOSIS — N764 Abscess of vulva: Secondary | ICD-10-CM | POA: Insufficient documentation

## 2019-03-17 MED ORDER — BISOPROLOL-HYDROCHLOROTHIAZIDE 5-6.25 MG PO TABS: 1.0000 | ORAL_TABLET | Freq: Every day | ORAL | 1 refills | Status: DC

## 2019-03-17 MED ORDER — AMLODIPINE BESYLATE 10 MG PO TABS: 10.0000 mg | ORAL_TABLET | Freq: Every day | ORAL | 1 refills | Status: DC

## 2019-04-02 DIAGNOSIS — I129 Hypertensive chronic kidney disease with stage 1 through stage 4 chronic kidney disease, or unspecified chronic kidney disease: Secondary | ICD-10-CM | POA: Diagnosis not present

## 2019-04-02 DIAGNOSIS — R600 Localized edema: Secondary | ICD-10-CM | POA: Diagnosis not present

## 2019-04-02 DIAGNOSIS — E213 Hyperparathyroidism, unspecified: Secondary | ICD-10-CM | POA: Diagnosis not present

## 2019-04-02 DIAGNOSIS — N184 Chronic kidney disease, stage 4 (severe): Secondary | ICD-10-CM | POA: Diagnosis not present

## 2019-04-13 ENCOUNTER — Other Ambulatory Visit: Payer: Self-pay | Admitting: Nephrology

## 2019-04-13 DIAGNOSIS — E213 Hyperparathyroidism, unspecified: Secondary | ICD-10-CM

## 2019-04-13 DIAGNOSIS — N184 Chronic kidney disease, stage 4 (severe): Secondary | ICD-10-CM | POA: Diagnosis not present

## 2019-04-13 DIAGNOSIS — R600 Localized edema: Secondary | ICD-10-CM | POA: Diagnosis not present

## 2019-04-13 DIAGNOSIS — I129 Hypertensive chronic kidney disease with stage 1 through stage 4 chronic kidney disease, or unspecified chronic kidney disease: Secondary | ICD-10-CM | POA: Diagnosis not present

## 2019-04-22 ENCOUNTER — Other Ambulatory Visit: Payer: Self-pay

## 2019-04-22 ENCOUNTER — Ambulatory Visit
Admission: RE | Admit: 2019-04-22 | Discharge: 2019-04-22 | Disposition: A | Discharge status: Home / Self Care | Payer: PPO | Source: Ambulatory Visit | Attending: Nephrology | Admitting: Nephrology

## 2019-04-22 DIAGNOSIS — E213 Hyperparathyroidism, unspecified: Secondary | ICD-10-CM

## 2019-04-22 DIAGNOSIS — E042 Nontoxic multinodular goiter: Secondary | ICD-10-CM | POA: Diagnosis not present

## 2019-05-11 ENCOUNTER — Encounter: Payer: Self-pay | Admitting: Nurse Practitioner

## 2019-05-11 ENCOUNTER — Other Ambulatory Visit: Payer: Self-pay

## 2019-05-11 ENCOUNTER — Ambulatory Visit (INDEPENDENT_AMBULATORY_CARE_PROVIDER_SITE_OTHER): Payer: PPO | Admitting: Nurse Practitioner

## 2019-05-11 VITALS — BP 132/90 | HR 67 | Resp 16 | Ht 59.0 in | Wt 164.0 lb

## 2019-05-11 DIAGNOSIS — H1013 Acute atopic conjunctivitis, bilateral: Secondary | ICD-10-CM

## 2019-05-11 DIAGNOSIS — R7301 Impaired fasting glucose: Secondary | ICD-10-CM

## 2019-05-11 DIAGNOSIS — N182 Chronic kidney disease, stage 2 (mild): Secondary | ICD-10-CM

## 2019-05-11 DIAGNOSIS — I10 Essential (primary) hypertension: Secondary | ICD-10-CM

## 2019-05-11 LAB — POCT GLYCOSYLATED HEMOGLOBIN (HGB A1C): Hemoglobin A1C: 5.3 % (ref 4.0–5.6)

## 2019-05-11 MED ORDER — OLOPATADINE HCL 0.2 % OP SOLN: 1.0000 [drp] | Freq: Every day | OPHTHALMIC | 5 refills | Status: DC

## 2019-05-11 NOTE — Progress Notes (Signed)
[facility] [place], [place] [address]  Internal MEDICINE  Office Visit Note  Patient Name: Elizabeth Roy  [id number]  [id number]  Date of Service: 05/24/2019  Chief Complaint  Patient presents with  . Medical Management of Chronic Issues    a1c was 7.7 when checked by NP through [employer]. pt forgot paperwork with this information. rechecked hgba1c and results was 5.3  . Arthritis  . Hypertension  . Chronic Kidney Disease    The patient is here for routine follow up visit. She recently had NP come to her home for wellness check. HgbA1c was checked and was 7.7. she has never had elevated blood sugar in the past. This was checked again today and was 5.3. she feels good. Blood pressure is well managed. She has itchy and watery eyes. Will develop crusty drainage in the mornings.       Current Medication: Outpatient Encounter Medications as of 05/11/2019  Medication Sig  . amLODipine (NORVASC) 10 MG tablet Take 1 tablet (10 mg total) by mouth daily.  . bisoprolol-hydrochlorothiazide (ZIAC) 5-6.25 MG tablet Take 1 tablet by mouth daily.  Marland Kitchen olmesartan (BENICAR) 40 MG tablet Take 1 tablet (40 mg total) by mouth at bedtime.  . traMADol (ULTRAM) 50 MG tablet Take 50 mg by mouth daily as needed for moderate pain.  Marland Kitchen Olopatadine HCl 0.2 % SOLN Place 1 drop into both eyes daily.  . [DISCONTINUED] ergocalciferol (DRISDOL) 50000 units capsule Take 1 capsule (50,000 Units total) by mouth once a week. (Patient not taking: Reported on 05/11/2019)  . [DISCONTINUED] fluconazole (DIFLUCAN) 150 MG tablet Take 1 tablet po once. May repeat dose in 3 days as needed for persistent symptoms. (Patient not taking: Reported on 05/11/2019)  . [DISCONTINUED] nystatin ointment (MYCOSTATIN) Apply 1 application topically 2 (two) times daily. (Patient not taking: Reported on 05/11/2019)  . [DISCONTINUED] sulfamethoxazole-trimethoprim (BACTRIM DS) 800-160 MG tablet Take 1 tablet by mouth 2 (two) times  daily. (Patient not taking: Reported on 05/11/2019)  . [DISCONTINUED] torsemide (DEMADEX) 20 MG tablet Take 1 tablet by mouth daily.   No facility-administered encounter medications on file as of 05/11/2019.     Surgical History: Past Surgical History:  Procedure Laterality Date  . MOUTH SURGERY      Medical History: Past Medical History:  Diagnosis Date  . Arthritis   . Hypertension   . Renal disorder     Family History: Family History  Problem Relation Age of Onset  . Hypertension Mother   . Intellectual disability Sister   . Stroke Sister     Social History   Socioeconomic History  . Marital status: Widowed    Spouse name: Not on file  . Number of children: Not on file  . Years of education: Not on file  . Highest education level: Not on file  Occupational History  . Not on file  Social Needs  . Financial resource strain: Not on file  . Food insecurity    Worry: Not on file    Inability: Not on file  . Transportation needs    Medical: Not on file    Non-medical: Not on file  Tobacco Use  . Smoking status: Current Some Day Smoker    Types: Cigarettes  . Smokeless tobacco: Never Used  . Tobacco comment: sometimes  Substance and Sexual Activity  . Alcohol use: Never    Frequency: Never  . Drug use: No  . Sexual activity: Not on file  Lifestyle  .  Physical activity    Days per week: Not on file    Minutes per session: Not on file  . Stress: Not on file  Relationships  . Social Herbalist on phone: Not on file    Gets together: Not on file    Attends religious service: Not on file    Active member of club or organization: Not on file    Attends meetings of clubs or organizations: Not on file    Relationship status: Not on file  . Intimate partner violence    Fear of current or ex partner: Not on file    Emotionally abused: Not on file    Physically abused: Not on file    Forced sexual activity: Not on file  Other Topics Concern  . Not  on file  Social History Narrative  . Not on file      Review of Systems  Constitutional: Negative for activity change, chills, fatigue and unexpected weight change.  HENT: Negative for congestion, postnasal drip, rhinorrhea, sneezing and sore throat.   Eyes: Positive for redness and itching.       Eyes watering .  Respiratory: Negative for cough, chest tightness, shortness of breath and wheezing.   Cardiovascular: Negative for chest pain and palpitations.  Gastrointestinal: Negative for abdominal pain, constipation, diarrhea, nausea and vomiting.  Endocrine: Negative for cold intolerance, heat intolerance, polydipsia and polyuria.  Musculoskeletal: Negative for arthralgias, back pain, joint swelling and neck pain.  Skin: Negative for rash.  Allergic/Immunologic: Negative for environmental allergies.  Neurological: Negative for dizziness, tremors, numbness and headaches.  Hematological: Negative for adenopathy. Does not bruise/bleed easily.  Psychiatric/Behavioral: Negative for behavioral problems (Depression), sleep disturbance and suicidal ideas. The patient is not nervous/anxious.     Today's Vitals   05/11/19 1604  BP: 132/90  Pulse: 67  Resp: 16  SpO2: 99%  Weight: 164 lb (74.4 kg)  Height: 4\' 11"  (1.499 m)   Body mass index is 33.12 kg/m.   Physical Exam Vitals signs and nursing note reviewed.  Constitutional:      General: She is not in acute distress.    Appearance: Normal appearance. She is well-developed. She is not diaphoretic.  HENT:     Head: Normocephalic and atraumatic.     Nose: Nose normal.     Mouth/Throat:     Pharynx: No oropharyngeal exudate.  Eyes:     Pupils: Pupils are equal, round, and reactive to light.  Neck:     Musculoskeletal: Normal range of motion and neck supple.     Thyroid: No thyromegaly.     Vascular: No JVD.     Trachea: No tracheal deviation.  Cardiovascular:     Rate and Rhythm: Normal rate and regular rhythm.     Heart  sounds: Normal heart sounds. No murmur. No friction rub. No gallop.   Pulmonary:     Effort: Pulmonary effort is normal. No respiratory distress.     Breath sounds: Normal breath sounds. No wheezing or rales.  Chest:     Chest wall: No tenderness.     Breasts:        Right: No inverted nipple, mass, nipple discharge, skin change or tenderness.        Left: No inverted nipple, mass, nipple discharge, skin change or tenderness.  Abdominal:     General: Bowel sounds are normal.     Palpations: Abdomen is soft.     Tenderness: There is no abdominal tenderness.  Genitourinary:    Vagina: Normal.  Musculoskeletal: Normal range of motion.  Lymphadenopathy:     Cervical: No cervical adenopathy.  Skin:    General: Skin is warm and dry.  Neurological:     Mental Status: She is alert and oriented to person, place, and time.     Cranial Nerves: No cranial nerve deficit.  Psychiatric:        Behavior: Behavior normal.        Thought Content: Thought content normal.        Judgment: Judgment normal.   Assessment/Plan: 1. Impaired fasting glucose - POCT HgB A1C 5.3 today. No evidence of diabetes present. Will continue to monitor closely.   2. Allergic conjunctivitis of both eyes Add pataday eye drops. Use 1 drop in both eyes daily.  - Olopatadine HCl 0.2 % SOLN; Place 1 drop into both eyes daily.  Dispense: 2.5 mL; Refill: 5  3. Essential hypertension Stable. Continue bp medication as prescribed.   4. Chronic kidney disease, stage 2, mildly decreased GFR Continue regular visits with nephrology as scheduled.   General Counseling: Zion verbalizes understanding of the findings of todays visit and agrees with plan of treatment. I have discussed any further diagnostic evaluation that may be needed or ordered today. We also reviewed her medications today. she has been encouraged to call the office with any questions or concerns that should arise related to todays visit.  Hypertension  Counseling:   The following hypertensive lifestyle modification were recommended and discussed:  1. Limiting alcohol intake to less than 1 oz/day of ethanol:(24 oz of beer or 8 oz of wine or 2 oz of 100-proof whiskey). 2. Take baby ASA 81 mg daily. 3. Importance of regular aerobic exercise and losing weight. 4. Reduce dietary saturated fat and cholesterol intake for overall cardiovascular health. 5. Maintaining adequate dietary potassium, calcium, and magnesium intake. 6. Regular monitoring of the blood pressure. 7. Reduce sodium intake to less than 100 mmol/day (less than 2.3 gm of sodium or less than 6 gm of sodium choride)   This patient was seen by [place] with Dr Lavera Guise as a part of collaborative care agreement  Orders Placed This Encounter  Procedures  . POCT HgB A1C    Meds ordered this encounter  Medications  . Olopatadine HCl 0.2 % SOLN    Sig: Place 1 drop into both eyes daily.    Dispense:  2.5 mL    Refill:  5    Order Specific Question:   Supervising Provider    Answer:   Lavera Guise [[id number]]    Time spent: 32 Minutes      Dr Lavera Guise Internal medicine

## 2019-05-24 DIAGNOSIS — H1013 Acute atopic conjunctivitis, bilateral: Secondary | ICD-10-CM | POA: Insufficient documentation

## 2019-05-24 DIAGNOSIS — R7301 Impaired fasting glucose: Secondary | ICD-10-CM | POA: Insufficient documentation

## 2019-06-17 DIAGNOSIS — I714 Abdominal aortic aneurysm, without rupture: Secondary | ICD-10-CM | POA: Diagnosis not present

## 2019-06-17 DIAGNOSIS — N183 Chronic kidney disease, stage 3 (moderate): Secondary | ICD-10-CM | POA: Diagnosis not present

## 2019-06-17 DIAGNOSIS — E213 Hyperparathyroidism, unspecified: Secondary | ICD-10-CM | POA: Diagnosis not present

## 2019-06-17 DIAGNOSIS — E559 Vitamin D deficiency, unspecified: Secondary | ICD-10-CM | POA: Diagnosis not present

## 2019-07-02 ENCOUNTER — Other Ambulatory Visit: Payer: Self-pay | Admitting: "Endocrinology

## 2019-07-02 DIAGNOSIS — E0590 Thyrotoxicosis, unspecified without thyrotoxic crisis or storm: Secondary | ICD-10-CM

## 2019-07-23 DIAGNOSIS — E559 Vitamin D deficiency, unspecified: Secondary | ICD-10-CM | POA: Diagnosis not present

## 2019-07-23 DIAGNOSIS — E213 Hyperparathyroidism, unspecified: Secondary | ICD-10-CM | POA: Diagnosis not present

## 2019-07-23 DIAGNOSIS — N1831 Chronic kidney disease, stage 3a: Secondary | ICD-10-CM | POA: Diagnosis not present

## 2019-08-06 ENCOUNTER — Other Ambulatory Visit: Payer: Self-pay

## 2019-08-06 ENCOUNTER — Encounter
Admission: RE | Admit: 2019-08-06 | Discharge: 2019-08-06 | Disposition: A | Discharge status: Home / Self Care | Payer: PPO | Source: Ambulatory Visit | Attending: "Endocrinology | Admitting: "Endocrinology

## 2019-08-06 DIAGNOSIS — E213 Hyperparathyroidism, unspecified: Secondary | ICD-10-CM | POA: Insufficient documentation

## 2019-08-06 DIAGNOSIS — N184 Chronic kidney disease, stage 4 (severe): Secondary | ICD-10-CM | POA: Diagnosis not present

## 2019-08-06 DIAGNOSIS — R809 Proteinuria, unspecified: Secondary | ICD-10-CM | POA: Insufficient documentation

## 2019-08-06 DIAGNOSIS — R801 Persistent proteinuria, unspecified: Secondary | ICD-10-CM | POA: Diagnosis not present

## 2019-08-06 DIAGNOSIS — E0590 Thyrotoxicosis, unspecified without thyrotoxic crisis or storm: Secondary | ICD-10-CM | POA: Diagnosis not present

## 2019-08-06 DIAGNOSIS — E210 Primary hyperparathyroidism: Secondary | ICD-10-CM | POA: Diagnosis not present

## 2019-08-06 DIAGNOSIS — I10 Essential (primary) hypertension: Secondary | ICD-10-CM | POA: Diagnosis not present

## 2019-08-06 MED ORDER — TECHNETIUM TC 99M SESTAMIBI GENERIC - CARDIOLITE
25.4500 | Freq: Once | INTRAVENOUS | Status: AC | PRN
  Administered 2019-08-06: 25.45 via INTRAVENOUS

## 2019-08-13 DIAGNOSIS — Z6828 Body mass index (BMI) 28.0-28.9, adult: Secondary | ICD-10-CM | POA: Diagnosis not present

## 2019-08-13 DIAGNOSIS — N189 Chronic kidney disease, unspecified: Secondary | ICD-10-CM | POA: Diagnosis not present

## 2019-08-13 DIAGNOSIS — K5730 Diverticulosis of large intestine without perforation or abscess without bleeding: Secondary | ICD-10-CM | POA: Diagnosis not present

## 2019-08-13 DIAGNOSIS — I716 Thoracoabdominal aortic aneurysm, without rupture: Secondary | ICD-10-CM | POA: Diagnosis not present

## 2019-08-13 DIAGNOSIS — Z79899 Other long term (current) drug therapy: Secondary | ICD-10-CM | POA: Diagnosis not present

## 2019-08-13 DIAGNOSIS — F17200 Nicotine dependence, unspecified, uncomplicated: Secondary | ICD-10-CM | POA: Diagnosis not present

## 2019-08-13 DIAGNOSIS — I129 Hypertensive chronic kidney disease with stage 1 through stage 4 chronic kidney disease, or unspecified chronic kidney disease: Secondary | ICD-10-CM | POA: Diagnosis not present

## 2019-09-02 ENCOUNTER — Telehealth: Payer: Self-pay

## 2019-09-02 NOTE — Telephone Encounter (Signed)
LMOM TO CONFIRM AND SCREEN FOR 09-04-19 OV.

## 2019-09-04 ENCOUNTER — Encounter: Payer: Self-pay | Admitting: Nurse Practitioner

## 2019-09-04 ENCOUNTER — Other Ambulatory Visit: Payer: Self-pay

## 2019-09-04 ENCOUNTER — Ambulatory Visit (INDEPENDENT_AMBULATORY_CARE_PROVIDER_SITE_OTHER): Payer: PPO | Admitting: Nurse Practitioner

## 2019-09-04 VITALS — BP 148/100 | HR 73 | Temp 98.2°F | Resp 16 | Ht 59.0 in | Wt 142.0 lb

## 2019-09-04 DIAGNOSIS — N182 Chronic kidney disease, stage 2 (mild): Secondary | ICD-10-CM

## 2019-09-04 DIAGNOSIS — I10 Essential (primary) hypertension: Secondary | ICD-10-CM | POA: Diagnosis not present

## 2019-09-04 DIAGNOSIS — Z0001 Encounter for general adult medical examination with abnormal findings: Secondary | ICD-10-CM

## 2019-09-04 DIAGNOSIS — R300 Dysuria: Secondary | ICD-10-CM

## 2019-09-04 DIAGNOSIS — I712 Thoracic aortic aneurysm, without rupture, unspecified: Secondary | ICD-10-CM

## 2019-09-04 MED ORDER — AMLODIPINE BESYLATE 10 MG PO TABS: 10.0000 mg | ORAL_TABLET | Freq: Every day | ORAL | 1 refills | Status: DC

## 2019-09-04 MED ORDER — BISOPROLOL FUMARATE 5 MG PO TABS: 5.0000 mg | ORAL_TABLET | Freq: Every day | ORAL | 3 refills | Status: DC

## 2019-09-04 NOTE — Progress Notes (Signed)
[facility] [place], [place] [address]  Internal MEDICINE  Office Visit Note  Patient Name: Elizabeth Roy  [id number]  EC:[id number]  Date of Service: 09/13/2019  Chief Complaint  Patient presents with  . Annual Exam  . Gynecologic Exam  . Hypertension  . Arthritis  . Medication Management    stopped taking bisoprolol made her have urinary frequency  . Constipation    sometimes has constipation   . Thyroid Problem    going to specialist     The patient is here for health maintenance exam. Blood pressure is slightly elevated today. She states that she feels well and has no concerns or complaints today. She is due to have routine, fasting labs drawn. She had normal pap smear 08/2018. She does have chronic kidney disease. She sees nephrology routinely.       Current Medication: Outpatient Encounter Medications as of 09/04/2019  Medication Sig Note  . amLODipine (NORVASC) 10 MG tablet Take 1 tablet (10 mg total) by mouth daily.   Marland Kitchen olmesartan (BENICAR) 40 MG tablet Take 1 tablet (40 mg total) by mouth at bedtime.   . Olopatadine HCl 0.2 % SOLN Place 1 drop into both eyes daily.   . traMADol (ULTRAM) 50 MG tablet Take 50 mg by mouth daily as needed for moderate pain.   . [DISCONTINUED] amLODipine (NORVASC) 10 MG tablet Take 1 tablet (10 mg total) by mouth daily.   . [DISCONTINUED] VITAMIN D PO Take by mouth every 30 (thirty) days.   . bisoprolol (ZEBETA) 5 MG tablet Take 1 tablet (5 mg total) by mouth at bedtime.   . [DISCONTINUED] bisoprolol-hydrochlorothiazide (ZIAC) 5-6.25 MG tablet Take 1 tablet by mouth daily. (Patient not taking: Reported on 09/04/2019) 09/04/2019: patient states this was making her feel bad.    No facility-administered encounter medications on file as of 09/04/2019.     Surgical History: Past Surgical History:  Procedure Laterality Date  . MOUTH SURGERY      Medical History: Past Medical History:  Diagnosis Date  .  Arthritis   . Hypertension   . Renal disorder     Family History: Family History  Problem Relation Age of Onset  . Hypertension Mother   . Intellectual disability Sister   . Stroke Sister     Social History   Socioeconomic History  . Marital status: Widowed    Spouse name: Not on file  . Number of children: Not on file  . Years of education: Not on file  . Highest education level: Not on file  Occupational History  . Not on file  Social Needs  . Financial resource strain: Not on file  . Food insecurity    Worry: Not on file    Inability: Not on file  . Transportation needs    Medical: Not on file    Non-medical: Not on file  Tobacco Use  . Smoking status: Current Some Day Smoker    Types: Cigarettes  . Smokeless tobacco: Never Used  . Tobacco comment: sometimes  Substance and Sexual Activity  . Alcohol use: Never    Frequency: Never  . Drug use: No  . Sexual activity: Not on file  Lifestyle  . Physical activity    Days per week: Not on file    Minutes per session: Not on file  . Stress: Not on file  Relationships  . Social Herbalist on phone: Not on file    Gets together:  Not on file    Attends religious service: Not on file    Active member of club or organization: Not on file    Attends meetings of clubs or organizations: Not on file    Relationship status: Not on file  . Intimate partner violence    Fear of current or ex partner: Not on file    Emotionally abused: Not on file    Physically abused: Not on file    Forced sexual activity: Not on file  Other Topics Concern  . Not on file  Social History Narrative  . Not on file      Review of Systems  Constitutional: Negative for chills and fever.  HENT: Negative for hearing loss, rhinorrhea and tinnitus.   Respiratory: Positive for shortness of breath. Negative for wheezing.        Reports shortness of breath with exertion  Cardiovascular: Negative for chest pain and palpitations.   Gastrointestinal: Positive for constipation. Negative for nausea and vomiting.  Endocrine: Negative for cold intolerance, heat intolerance, polydipsia and polyuria.  Musculoskeletal: Negative for joint swelling.  Skin: Negative for rash and wound.  Allergic/Immunologic: Negative for environmental allergies.  Neurological: Negative for dizziness, weakness and headaches.  Hematological: Negative for adenopathy.  Psychiatric/Behavioral: The patient is not nervous/anxious.     Today's Vitals   09/04/19 1404  BP: (!) 148/100  Pulse: 73  Resp: 16  Temp: 98.2 F (36.8 C)  SpO2: 99%  Weight: 142 lb (64.4 kg)  Height: 4\' 11"  (1.499 m)   Body mass index is 28.68 kg/m.   Physical Exam Vitals signs and nursing note reviewed. Exam conducted with a chaperone present.  Constitutional:      Appearance: Normal appearance.  HENT:     Nose: Nose normal.  Eyes:     Extraocular Movements: Extraocular movements intact.     Pupils: Pupils are equal, round, and reactive to light.  Neck:     Musculoskeletal: Normal range of motion and neck supple.     Vascular: No carotid bruit.  Cardiovascular:     Rate and Rhythm: Normal rate and regular rhythm.     Pulses: Normal pulses.     Heart sounds: Normal heart sounds.  Pulmonary:     Effort: Pulmonary effort is normal.     Breath sounds: Normal breath sounds.  Chest:     Breasts:        Right: Normal. No swelling, bleeding, inverted nipple, mass, nipple discharge, skin change or tenderness.        Left: Normal. No swelling, bleeding, inverted nipple, mass, nipple discharge, skin change or tenderness.  Abdominal:     General: Bowel sounds are normal.     Palpations: Abdomen is soft.     Tenderness: There is no abdominal tenderness.  Musculoskeletal: Normal range of motion.  Skin:    General: Skin is warm and dry.  Neurological:     General: No focal deficit present.     Mental Status: She is alert and oriented to person, place, and time.   Psychiatric:        Mood and Affect: Mood normal.        Behavior: Behavior normal.        Thought Content: Thought content normal.        Judgment: Judgment normal.    Depression screen [facility] 2/9 09/04/2019 05/11/2019 03/02/2019 09/02/2018 05/19/2018  Decreased Interest 0 0 0 0 0  Down, Depressed, Hopeless 0 0 0 0 0  PHQ - 2 Score 0 0 0 0 0    Functional Status Survey: Is the patient deaf or have difficulty hearing?: No Does the patient have difficulty seeing, even when wearing glasses/contacts?: Yes(sometimes) Does the patient have difficulty concentrating, remembering, or making decisions?: No Does the patient have difficulty walking or climbing stairs?: No Does the patient have difficulty dressing or bathing?: No Does the patient have difficulty doing errands alone such as visiting a doctor's office or shopping?: No  MMSE - [place] Exam 09/04/2019  Orientation to time 5  Orientation to Place 5  Registration 3  Attention/ Calculation 5  Recall 3  Language- name 2 objects 2  Language- repeat 1  Language- follow 3 step command 3  Language- read & follow direction 1  Write a sentence 1  Copy design 1  Total score 30    Fall Risk  09/04/2019 05/11/2019 03/02/2019 09/02/2018 05/19/2018  Falls in the past year? 0 0 0 0 No  Number falls in past yr: - - 0 - -  Injury with Fall? - - 0 - -   Assessment/Plan: 1. Encounter for general adult medical examination with abnormal findings Annual health maintenance exam today.  2. Essential hypertension Generally stable. Continue BP medication as prescribed. Refills provided today.  - bisoprolol (ZEBETA) 5 MG tablet; Take 1 tablet (5 mg total) by mouth at bedtime.  Dispense: 30 tablet; Refill: 3 - amLODipine (NORVASC) 10 MG tablet; Take 1 tablet (10 mg total) by mouth daily.  Dispense: 90 tablet; Refill: 1  3. Chronic kidney disease, stage 2, mildly decreased GFR Continue regular visits with with nephrology as scheduled.   4.  Thoracic aortic aneurysm without rupture (HCC) Regular visits with vein and vascular as scheduled.   5. Dysuria - UA/M w/rflx Culture, Routine  General Counseling: Yarelli verbalizes understanding of the findings of todays visit and agrees with plan of treatment. I have discussed any further diagnostic evaluation that may be needed or ordered today. We also reviewed her medications today. she has been encouraged to call the office with any questions or concerns that should arise related to todays visit.  Hypertension Counseling:   The following hypertensive lifestyle modification were recommended and discussed:  1. Limiting alcohol intake to less than 1 oz/day of ethanol:(24 oz of beer or 8 oz of wine or 2 oz of 100-proof whiskey). 2. Take baby ASA 81 mg daily. 3. Importance of regular aerobic exercise and losing weight. 4. Reduce dietary saturated fat and cholesterol intake for overall cardiovascular health. 5. Maintaining adequate dietary potassium, calcium, and magnesium intake. 6. Regular monitoring of the blood pressure. 7. Reduce sodium intake to less than 100 mmol/day (less than 2.3 gm of sodium or less than 6 gm of sodium choride)   This patient was seen by [place] with Dr Lavera Guise as a part of collaborative care agreement  Orders Placed This Encounter  Procedures  . Microscopic Examination  . Urine Culture, Reflex  . UA/M w/rflx Culture, Routine    Meds ordered this encounter  Medications  . bisoprolol (ZEBETA) 5 MG tablet    Sig: Take 1 tablet (5 mg total) by mouth at bedtime.    Dispense:  30 tablet    Refill:  3    Order Specific Question:   Supervising Provider    Answer:   Lavera Guise [id number]  . amLODipine (NORVASC) 10 MG tablet    Sig: Take 1 tablet (10 mg total)  by mouth daily.    Dispense:  90 tablet    Refill:  1    Order Specific Question:   Supervising Provider    Answer:   Lavera Guise [id number]    Time spent: 53  Minutes      Dr Lavera Guise Internal medicine

## 2019-09-07 LAB — UA/M W/RFLX CULTURE, ROUTINE
Bilirubin, UA: NEGATIVE
Glucose, UA: NEGATIVE
Ketones, UA: NEGATIVE
Leukocytes,UA: NEGATIVE
Nitrite, UA: NEGATIVE
RBC, UA: NEGATIVE
Specific Gravity, UA: 1.014 (ref 1.005–1.030)
Urobilinogen, Ur: 0.2 mg/dL (ref 0.2–1.0)
pH, UA: 5 (ref 5.0–7.5)

## 2019-09-07 LAB — MICROSCOPIC EXAMINATION: Casts: NONE SEEN /lpf

## 2019-09-07 LAB — URINE CULTURE, REFLEX

## 2019-09-07 NOTE — Progress Notes (Signed)
Patient asymptomatic. Will monitor.

## 2019-09-08 ENCOUNTER — Telehealth: Payer: Self-pay

## 2019-09-08 NOTE — Telephone Encounter (Signed)
LMOM FOR PATIENT TO CONFIRM AND SCREEN FOR 09-14-19 OV.

## 2019-09-13 DIAGNOSIS — Z0001 Encounter for general adult medical examination with abnormal findings: Secondary | ICD-10-CM | POA: Insufficient documentation

## 2019-09-14 ENCOUNTER — Encounter: Payer: Self-pay | Admitting: Nurse Practitioner

## 2019-09-14 ENCOUNTER — Ambulatory Visit (INDEPENDENT_AMBULATORY_CARE_PROVIDER_SITE_OTHER): Payer: PPO | Admitting: Nurse Practitioner

## 2019-09-14 ENCOUNTER — Other Ambulatory Visit: Payer: Self-pay

## 2019-09-14 VITALS — BP 138/94 | HR 57 | Resp 16 | Ht 59.0 in | Wt 144.0 lb

## 2019-09-14 DIAGNOSIS — Z1231 Encounter for screening mammogram for malignant neoplasm of breast: Secondary | ICD-10-CM | POA: Diagnosis not present

## 2019-09-14 DIAGNOSIS — N182 Chronic kidney disease, stage 2 (mild): Secondary | ICD-10-CM

## 2019-09-14 DIAGNOSIS — I10 Essential (primary) hypertension: Secondary | ICD-10-CM

## 2019-09-14 MED ORDER — OLMESARTAN MEDOXOMIL 40 MG PO TABS: 40.0000 mg | ORAL_TABLET | Freq: Every day | ORAL | 3 refills | Status: DC

## 2019-09-14 MED ORDER — BISOPROLOL FUMARATE 5 MG PO TABS: 2.5000 mg | ORAL_TABLET | Freq: Every day | ORAL | 1 refills | Status: DC

## 2019-09-14 NOTE — Progress Notes (Signed)
[facility] [place], [place] [address]  Internal MEDICINE  Office Visit Note  Patient Name: Elizabeth Roy  [id number]  [id number]  Date of Service: 09/14/2019  Chief Complaint  Patient presents with  . Hypertension  . Arthritis  . Medication Refill    when taking bisoprolol pt feels like she has a slight headache    The patient is here for follow up for her blood pressure. Was started on bisoprolol 2.5mg  tablets daily. She states that she gets a slight headache for short period of time after she takes this. Goes away quickly on it's own. She has had no other negative side effects. Blood pressure and heart rate are both  Lower today than they were at her last visit.       Current Medication: Outpatient Encounter Medications as of 09/14/2019  Medication Sig  . amLODipine (NORVASC) 10 MG tablet Take 1 tablet (10 mg total) by mouth daily.  . bisoprolol (ZEBETA) 5 MG tablet Take 0.5 tablets (2.5 mg total) by mouth at bedtime.  Marland Kitchen olmesartan (BENICAR) 40 MG tablet Take 1 tablet (40 mg total) by mouth at bedtime.  . Olopatadine HCl 0.2 % SOLN Place 1 drop into both eyes daily.  . traMADol (ULTRAM) 50 MG tablet Take 50 mg by mouth daily as needed for moderate pain.  . [DISCONTINUED] bisoprolol (ZEBETA) 5 MG tablet Take 1 tablet (5 mg total) by mouth at bedtime.  . [DISCONTINUED] olmesartan (BENICAR) 40 MG tablet Take 1 tablet (40 mg total) by mouth at bedtime.   No facility-administered encounter medications on file as of 09/14/2019.     Surgical History: Past Surgical History:  Procedure Laterality Date  . MOUTH SURGERY      Medical History: Past Medical History:  Diagnosis Date  . Arthritis   . Hypertension   . Renal disorder     Family History: Family History  Problem Relation Age of Onset  . Hypertension Mother   . Intellectual disability Sister   . Stroke Sister     Social History   Socioeconomic History  . Marital status: Widowed     Spouse name: Not on file  . Number of children: Not on file  . Years of education: Not on file  . Highest education level: Not on file  Occupational History  . Not on file  Social Needs  . Financial resource strain: Not on file  . Food insecurity    Worry: Not on file    Inability: Not on file  . Transportation needs    Medical: Not on file    Non-medical: Not on file  Tobacco Use  . Smoking status: Current Some Day Smoker    Types: Cigarettes  . Smokeless tobacco: Never Used  . Tobacco comment: sometimes  Substance and Sexual Activity  . Alcohol use: Never    Frequency: Never  . Drug use: No  . Sexual activity: Not on file  Lifestyle  . Physical activity    Days per week: Not on file    Minutes per session: Not on file  . Stress: Not on file  Relationships  . Social Herbalist on phone: Not on file    Gets together: Not on file    Attends religious service: Not on file    Active member of club or organization: Not on file    Attends meetings of clubs or organizations: Not on file    Relationship status: Not on file  .  Intimate partner violence    Fear of current or ex partner: Not on file    Emotionally abused: Not on file    Physically abused: Not on file    Forced sexual activity: Not on file  Other Topics Concern  . Not on file  Social History Narrative  . Not on file      Review of Systems  Constitutional: Negative for chills and fever.  HENT: Negative for hearing loss, rhinorrhea and tinnitus.   Respiratory: Positive for shortness of breath. Negative for wheezing.        Reports shortness of breath with exertion  Cardiovascular: Negative for chest pain and palpitations.       Improved blood pressure since her most recent visit   Gastrointestinal: Negative for constipation, nausea and vomiting.  Endocrine: Negative for cold intolerance, heat intolerance, polydipsia and polyuria.  Musculoskeletal: Negative for joint swelling.  Skin: Negative  for rash and wound.  Allergic/Immunologic: Negative for environmental allergies.  Neurological: Negative for dizziness, weakness and headaches.  Hematological: Negative for adenopathy.  Psychiatric/Behavioral: The patient is not nervous/anxious.     Today's Vitals   09/14/19 1542  BP: (!) 138/94  Pulse: (!) 57  Resp: 16  SpO2: 99%  Weight: 144 lb (65.3 kg)  Height: 4\' 11"  (1.499 m)   Body mass index is 29.08 kg/m.  Physical Exam Vitals signs and nursing note reviewed.  Constitutional:      General: She is not in acute distress.    Appearance: Normal appearance. She is well-developed. She is not diaphoretic.  HENT:     Head: Normocephalic and atraumatic.     Mouth/Throat:     Pharynx: No oropharyngeal exudate.  Eyes:     Pupils: Pupils are equal, round, and reactive to light.  Neck:     Musculoskeletal: Normal range of motion and neck supple.     Thyroid: No thyromegaly.     Vascular: No JVD.     Trachea: No tracheal deviation.  Cardiovascular:     Rate and Rhythm: Normal rate and regular rhythm.     Heart sounds: Normal heart sounds. No murmur. No friction rub. No gallop.   Pulmonary:     Effort: Pulmonary effort is normal. No respiratory distress.     Breath sounds: Normal breath sounds. No wheezing or rales.  Chest:     Chest wall: No tenderness.  Abdominal:     General: Bowel sounds are normal.     Palpations: Abdomen is soft.  Musculoskeletal: Normal range of motion.  Lymphadenopathy:     Cervical: No cervical adenopathy.  Skin:    General: Skin is warm and dry.  Neurological:     Mental Status: She is alert and oriented to person, place, and time.     Cranial Nerves: No cranial nerve deficit.  Psychiatric:        Behavior: Behavior normal.        Thought Content: Thought content normal.        Judgment: Judgment normal.    Assessment/Plan: 1. Essential hypertension Improved. Continue meds as prescribed. Ninety day prescriptions sent to her pharmacy.   - olmesartan (BENICAR) 40 MG tablet; Take 1 tablet (40 mg total) by mouth at bedtime.  Dispense: 90 tablet; Refill: 3 - bisoprolol (ZEBETA) 5 MG tablet; Take 0.5 tablets (2.5 mg total) by mouth at bedtime.  Dispense: 45 tablet; Refill: 1  2. Chronic kidney disease, stage 2, mildly decreased GFR Continue regular visits with nephrology as scheduled.  3. Encounter for screening mammogram for malignant neoplasm of breast - screening mammo; Future  General Counseling: Elizabeth Roy verbalizes understanding of the findings of todays visit and agrees with plan of treatment. I have discussed any further diagnostic evaluation that may be needed or ordered today. We also reviewed her medications today. she has been encouraged to call the office with any questions or concerns that should arise related to todays visit.  Hypertension Counseling:   The following hypertensive lifestyle modification were recommended and discussed:  1. Limiting alcohol intake to less than 1 oz/day of ethanol:(24 oz of beer or 8 oz of wine or 2 oz of 100-proof whiskey). 2. Take baby ASA 81 mg daily. 3. Importance of regular aerobic exercise and losing weight. 4. Reduce dietary saturated fat and cholesterol intake for overall cardiovascular health. 5. Maintaining adequate dietary potassium, calcium, and magnesium intake. 6. Regular monitoring of the blood pressure. 7. Reduce sodium intake to less than 100 mmol/day (less than 2.3 gm of sodium or less than 6 gm of sodium choride)   This patient was seen by [place] with Dr Lavera Guise as a part of collaborative care agreement  Orders Placed This Encounter  Procedures  . screening mammo    Meds ordered this encounter  Medications  . olmesartan (BENICAR) 40 MG tablet    Sig: Take 1 tablet (40 mg total) by mouth at bedtime.    Dispense:  90 tablet    Refill:  3    Order Specific Question:   Supervising Provider    Answer:   Lavera Guise [id number]  .  bisoprolol (ZEBETA) 5 MG tablet    Sig: Take 0.5 tablets (2.5 mg total) by mouth at bedtime.    Dispense:  45 tablet    Refill:  1    Order Specific Question:   Supervising Provider    Answer:   Lavera Guise [id number]    Time spent: 17 Minutes      Dr Lavera Guise Internal medicine

## 2019-10-27 ENCOUNTER — Inpatient Hospital Stay
Admission: EM | Admit: 2019-10-27 | Discharge: 2019-11-01 | DRG: 300 | Disposition: A | Discharge status: Home Health | Payer: PPO | Attending: Internal Medicine | Admitting: Internal Medicine

## 2019-10-27 ENCOUNTER — Emergency Department: Payer: PPO

## 2019-10-27 ENCOUNTER — Other Ambulatory Visit: Payer: Self-pay

## 2019-10-27 DIAGNOSIS — Z810 Family history of intellectual disabilities: Secondary | ICD-10-CM | POA: Diagnosis not present

## 2019-10-27 DIAGNOSIS — F17200 Nicotine dependence, unspecified, uncomplicated: Secondary | ICD-10-CM | POA: Diagnosis present

## 2019-10-27 DIAGNOSIS — R1013 Epigastric pain: Secondary | ICD-10-CM | POA: Diagnosis not present

## 2019-10-27 DIAGNOSIS — N179 Acute kidney failure, unspecified: Secondary | ICD-10-CM | POA: Diagnosis not present

## 2019-10-27 DIAGNOSIS — Z716 Tobacco abuse counseling: Secondary | ICD-10-CM | POA: Diagnosis not present

## 2019-10-27 DIAGNOSIS — D631 Anemia in chronic kidney disease: Secondary | ICD-10-CM | POA: Diagnosis present

## 2019-10-27 DIAGNOSIS — I7103 Dissection of thoracoabdominal aorta: Secondary | ICD-10-CM | POA: Diagnosis not present

## 2019-10-27 DIAGNOSIS — I129 Hypertensive chronic kidney disease with stage 1 through stage 4 chronic kidney disease, or unspecified chronic kidney disease: Secondary | ICD-10-CM | POA: Diagnosis not present

## 2019-10-27 DIAGNOSIS — I714 Abdominal aortic aneurysm, without rupture, unspecified: Secondary | ICD-10-CM | POA: Diagnosis present

## 2019-10-27 DIAGNOSIS — N184 Chronic kidney disease, stage 4 (severe): Secondary | ICD-10-CM | POA: Diagnosis not present

## 2019-10-27 DIAGNOSIS — I7100 Dissection of unspecified site of aorta: Secondary | ICD-10-CM | POA: Diagnosis present

## 2019-10-27 DIAGNOSIS — I10 Essential (primary) hypertension: Secondary | ICD-10-CM | POA: Diagnosis not present

## 2019-10-27 DIAGNOSIS — Z79891 Long term (current) use of opiate analgesic: Secondary | ICD-10-CM | POA: Diagnosis not present

## 2019-10-27 DIAGNOSIS — M1990 Unspecified osteoarthritis, unspecified site: Secondary | ICD-10-CM | POA: Diagnosis not present

## 2019-10-27 DIAGNOSIS — E210 Primary hyperparathyroidism: Secondary | ICD-10-CM | POA: Diagnosis present

## 2019-10-27 DIAGNOSIS — I716 Thoracoabdominal aortic aneurysm, without rupture: Secondary | ICD-10-CM | POA: Diagnosis not present

## 2019-10-27 DIAGNOSIS — Z823 Family history of stroke: Secondary | ICD-10-CM

## 2019-10-27 DIAGNOSIS — Z8249 Family history of ischemic heart disease and other diseases of the circulatory system: Secondary | ICD-10-CM | POA: Diagnosis not present

## 2019-10-27 DIAGNOSIS — R0789 Other chest pain: Secondary | ICD-10-CM | POA: Diagnosis not present

## 2019-10-27 DIAGNOSIS — Z79899 Other long term (current) drug therapy: Secondary | ICD-10-CM

## 2019-10-27 DIAGNOSIS — I1310 Hypertensive heart and chronic kidney disease without heart failure, with stage 1 through stage 4 chronic kidney disease, or unspecified chronic kidney disease: Secondary | ICD-10-CM | POA: Diagnosis not present

## 2019-10-27 DIAGNOSIS — Z20822 Contact with and (suspected) exposure to covid-19: Secondary | ICD-10-CM | POA: Diagnosis not present

## 2019-10-27 DIAGNOSIS — R109 Unspecified abdominal pain: Secondary | ICD-10-CM | POA: Diagnosis not present

## 2019-10-27 DIAGNOSIS — Z8379 Family history of other diseases of the digestive system: Secondary | ICD-10-CM

## 2019-10-27 DIAGNOSIS — D509 Iron deficiency anemia, unspecified: Secondary | ICD-10-CM | POA: Diagnosis present

## 2019-10-27 DIAGNOSIS — Z03818 Encounter for observation for suspected exposure to other biological agents ruled out: Secondary | ICD-10-CM | POA: Diagnosis not present

## 2019-10-27 DIAGNOSIS — R809 Proteinuria, unspecified: Secondary | ICD-10-CM | POA: Diagnosis not present

## 2019-10-27 DIAGNOSIS — N200 Calculus of kidney: Secondary | ICD-10-CM | POA: Diagnosis not present

## 2019-10-27 DIAGNOSIS — I7101 Dissection of thoracic aorta: Principal | ICD-10-CM | POA: Diagnosis present

## 2019-10-27 DIAGNOSIS — N183 Chronic kidney disease, stage 3 unspecified: Secondary | ICD-10-CM | POA: Diagnosis present

## 2019-10-27 DIAGNOSIS — F17210 Nicotine dependence, cigarettes, uncomplicated: Secondary | ICD-10-CM | POA: Diagnosis not present

## 2019-10-27 DIAGNOSIS — D649 Anemia, unspecified: Secondary | ICD-10-CM | POA: Diagnosis present

## 2019-10-27 DIAGNOSIS — R079 Chest pain, unspecified: Secondary | ICD-10-CM | POA: Diagnosis not present

## 2019-10-27 DIAGNOSIS — R1010 Upper abdominal pain, unspecified: Secondary | ICD-10-CM | POA: Diagnosis not present

## 2019-10-27 DIAGNOSIS — I71019 Dissection of thoracic aorta, unspecified: Secondary | ICD-10-CM

## 2019-10-27 LAB — BASIC METABOLIC PANEL
Anion gap: 9 (ref 5–15)
BUN: 46 mg/dL — ABNORMAL HIGH (ref 8–23)
CO2: 20 mmol/L — ABNORMAL LOW (ref 22–32)
Calcium: 10.1 mg/dL (ref 8.9–10.3)
Chloride: 109 mmol/L (ref 98–111)
Creatinine, Ser: 3.14 mg/dL — ABNORMAL HIGH (ref 0.44–1.00)
GFR calc Af Amer: 17 mL/min — ABNORMAL LOW (ref >60)
GFR calc non Af Amer: 15 mL/min — ABNORMAL LOW (ref >60)
Glucose, Bld: 125 mg/dL — ABNORMAL HIGH (ref 70–99)
Potassium: 5.1 mmol/L (ref 3.5–5.1)
Sodium: 138 mmol/L (ref 135–145)

## 2019-10-27 LAB — TROPONIN I (HIGH SENSITIVITY)
Troponin I (High Sensitivity): 9 ng/L (ref <18)
Troponin I (High Sensitivity): 9 ng/L (ref <18)

## 2019-10-27 LAB — CBC
HCT: 30.2 % — ABNORMAL LOW (ref 36.0–46.0)
Hemoglobin: 9.2 g/dL — ABNORMAL LOW (ref 12.0–15.0)
MCH: 27.9 pg (ref 26.0–34.0)
MCHC: 30.5 g/dL (ref 30.0–36.0)
MCV: 91.5 fL (ref 80.0–100.0)
Platelets: 275 10*3/uL (ref 150–400)
RBC: 3.3 MIL/uL — ABNORMAL LOW (ref 3.87–5.11)
RDW: 13.7 % (ref 11.5–15.5)
WBC: 8.6 10*3/uL (ref 4.0–10.5)
nRBC: 0 % (ref 0.0–0.2)

## 2019-10-27 LAB — HEPATIC FUNCTION PANEL
ALT: 8 U/L (ref 0–44)
AST: 13 U/L — ABNORMAL LOW (ref 15–41)
Albumin: 3.9 g/dL (ref 3.5–5.0)
Alkaline Phosphatase: 57 U/L (ref 38–126)
Bilirubin, Direct: <0.1 mg/dL (ref 0.0–0.2)
Total Bilirubin: 0.5 mg/dL (ref 0.3–1.2)
Total Protein: 7.1 g/dL (ref 6.5–8.1)

## 2019-10-27 LAB — LIPASE, BLOOD: Lipase: 34 U/L (ref 11–51)

## 2019-10-27 MED ORDER — SODIUM CHLORIDE 0.9 % IV BOLUS
1000.0000 mL | Freq: Once | INTRAVENOUS | Status: AC
  Administered 2019-10-27: 1000 mL via INTRAVENOUS

## 2019-10-27 MED ORDER — ONDANSETRON HCL 4 MG/2ML IJ SOLN
4.0000 mg | Freq: Once | INTRAMUSCULAR | Status: AC
  Administered 2019-10-27: 4 mg via INTRAVENOUS
  Filled 2019-10-27: qty 2

## 2019-10-27 MED ORDER — MORPHINE SULFATE (PF) 4 MG/ML IV SOLN
4.0000 mg | Freq: Once | INTRAVENOUS | Status: AC
  Administered 2019-10-27: 4 mg via INTRAVENOUS
  Filled 2019-10-27: qty 1

## 2019-10-27 NOTE — ED Provider Notes (Signed)
[facility] Emergency Department Provider Note   ____________________________________________   I have reviewed the triage vital signs and the nursing notes.   HISTORY  Chief Complaint Abdominal pain  History limited by: Not Limited   HPI Elizabeth Roy is a 64 y.o. female who presents to the emergency department today with primary concern for abdominal pain.  Patient states it is located across her upper abdomen.  It started today shortly after she finished eating.  She initially thought that it might be indigestion.  The pain however persisted longer than she would have expected.  She has not had any significant nausea or vomiting.  No shortness of breath.  The pain does radiate towards her back. She has felt cold.  Denies any recent illness or fevers.  Denies similar pain in the past.  Denies history of abdominal surgery. Some family members with history of gallstones.    Records reviewed. Per medical record review patient has a history of HTN  Past Medical History:  Diagnosis Date  . Arthritis   . Hypertension   . Renal disorder     Patient Active Problem List   Diagnosis Date Noted  . Encounter for general adult medical examination with abnormal findings 09/13/2019  . Impaired fasting glucose 05/24/2019  . Allergic conjunctivitis of both eyes 05/24/2019  . Vulvar furuncle 03/17/2019  . Screening for breast cancer 09/03/2018  . Screening for malignant neoplasm of cervix 09/03/2018  . Dysuria 09/03/2018  . Iron deficiency anemia 05/19/2018  . Chronic kidney disease, stage 2, mildly decreased GFR 05/19/2018  . Tobacco use disorder 05/02/2018  . Thoracoabdominal aneurysm ([place]) 05/02/2018  . Renal disorder 05/02/2018  . Carotid artery disease ([place]) 02/21/2018  . Thoracic aortic aneurysm without rupture ([place]) 02/21/2018  . Vitamin D deficiency 02/21/2018  . Swelling of ankle joint 12/10/2017  . Other specified noninflammatory disorders of vagina  10/16/2017  . Viral wart 10/16/2017  . Abdominal aortic aneurysm without rupture ([place]) 10/16/2017  . Constipation 10/16/2017  . Pain in left shoulder 10/16/2017  . Acute upper respiratory infection 10/16/2017  . Vaginal candidiasis 10/16/2017  . Unspecified asthma, uncomplicated [id number]  . Vasomotor rhinitis 10/16/2017  . Mixed hyperlipidemia 10/16/2017  . SOB (shortness of breath) 10/16/2017  . Essential hypertension 10/16/2017    Past Surgical History:  Procedure Laterality Date  . MOUTH SURGERY      Prior to Admission medications   Medication Sig Start Date End Date Taking? Authorizing Provider  amLODipine (NORVASC) 10 MG tablet Take 1 tablet (10 mg total) by mouth daily. 09/04/19   Ronnell Freshwater, NP  bisoprolol (ZEBETA) 5 MG tablet Take 0.5 tablets (2.5 mg total) by mouth at bedtime. 09/14/19   Ronnell Freshwater, NP  olmesartan (BENICAR) 40 MG tablet Take 1 tablet (40 mg total) by mouth at bedtime. 09/14/19   Ronnell Freshwater, NP  Olopatadine HCl 0.2 % SOLN Place 1 drop into both eyes daily. 05/11/19   Ronnell Freshwater, NP  traMADol (ULTRAM) 50 MG tablet Take 50 mg by mouth daily as needed for moderate pain.    [provider]    Allergies Patient has no known allergies.  Family History  Problem Relation Age of Onset  . Hypertension Mother   . Intellectual disability Sister   . Stroke Sister     Social History Social History   Tobacco Use  . Smoking status: Current Some Day Smoker    Types: Cigarettes  . Smokeless tobacco: Never Used  .  Tobacco comment: sometimes  Substance Use Topics  . Alcohol use: Never  . Drug use: No    Review of Systems Constitutional: No fever/chills Eyes: No visual changes. ENT: No sore throat. Cardiovascular: Denies chest pain. Respiratory: Denies shortness of breath. Gastrointestinal: Positive for abdominal pain. Genitourinary: Negative for dysuria. Musculoskeletal: Negative for back pain. Skin: Negative for  rash. Neurological: Negative for headaches, focal weakness or numbness.  ____________________________________________   PHYSICAL EXAM:  VITAL SIGNS: ED Triage Vitals  Enc Vitals Group     BP 10/27/19 1939 (!) 152/104     Pulse Rate 10/27/19 1939 68     Resp 10/27/19 1939 18     Temp 10/27/19 1939 98.1 F (36.7 C)     Temp Source 10/27/19 1939 Oral     SpO2 10/27/19 1939 100 %     Weight 10/27/19 1934 150 lb (68 kg)     Height 10/27/19 1934 4\' 11"  (1.499 m)     Head Circumference --      Peak Flow --      Pain Score 10/27/19 1934 7   Constitutional: Alert and oriented.  Eyes: Conjunctivae are normal.  ENT      Head: Normocephalic and atraumatic.      Nose: No congestion/rhinnorhea.      Mouth/Throat: Mucous membranes are moist.      Neck: No stridor. Hematological/Lymphatic/Immunilogical: No cervical lymphadenopathy. Cardiovascular: Normal rate, regular rhythm.  No murmurs, rubs, or gallops.  Respiratory: Normal respiratory effort without tachypnea nor retractions. Breath sounds are clear and equal bilaterally. No wheezes/rales/rhonchi. Gastrointestinal: Soft and tender in the epigastric and upper abdomen.  Genitourinary: Deferred Musculoskeletal: Normal range of motion in all extremities. No lower extremity edema. Neurologic:  Normal speech and language. No gross focal neurologic deficits are appreciated.  Skin:  Skin is warm, dry and intact. No rash noted. Psychiatric: Mood and affect are normal. Speech and behavior are normal. Patient exhibits appropriate insight and judgment.  ____________________________________________    LABS (pertinent positives/negatives)  Trop hs 9 CBC wbc 8.6, hgb 9.2, plt 275 BMP na 138, k 5.1, glu 125, cr 3.14 Lipase 34 Hepatic function panel AST 13 ____________________________________________   EKG  I, Nance Pear, attending physician, personally viewed and interpreted this EKG  EKG Time: 1936 Rate: 66 Rhythm: normal sinus  rhythm Axis: normal Intervals: qtc 419 QRS: narrow ST changes: no st elevation Impression: normal ekg   ____________________________________________    RADIOLOGY  CXR No acute cardiopulmonary disease  ____________________________________________   PROCEDURES  Procedures  ____________________________________________   INITIAL IMPRESSION / ASSESSMENT AND PLAN / ED COURSE  Pertinent labs & imaging results that were available during my care of the patient were reviewed by me and considered in my medical decision making (see chart for details).   Patient presented to the emergency department today because of concerns for abdominal pain that started shortly after she finished eating. On exam patient had some tenderness in the epigastric region. Do have concern for gallbladder disease. Will obtain US.    ____________________________________________   FINAL CLINICAL IMPRESSION(S) / ED DIAGNOSES  Final diagnoses:  Epigastric pain  Abdominal pain, unspecified abdominal location     Note: This dictation was prepared with Dragon dictation. Any transcriptional errors that result from this process are unintentional     Nance Pear, MD 10/28/19 [phone]

## 2019-10-27 NOTE — ED Notes (Signed)
Iv started  meds given.   

## 2019-10-27 NOTE — ED Notes (Signed)
Pt reports abd pain radiating into mid back area.  Sx began today.  No n/v/d  No urinary sx.

## 2019-10-27 NOTE — ED Triage Notes (Signed)
Pt here from home via [facility]. Per EMS, pt leaned over to her right side today while watching TV when she started having back pain and chest pain. Pt has renal disease and was diagnosed with a AAA last year which is being monitored. Pt describes pain is constant and moves around when she moves.   EMS VSS- 148/90, HR 62, O2 99% RA.

## 2019-10-27 NOTE — ED Notes (Signed)
Up to bathroom with assistance.

## 2019-10-27 NOTE — ED Notes (Signed)
ED Provider at bedside. 

## 2019-10-28 ENCOUNTER — Emergency Department: Payer: PPO

## 2019-10-28 DIAGNOSIS — I716 Thoracoabdominal aortic aneurysm, without rupture: Secondary | ICD-10-CM | POA: Diagnosis present

## 2019-10-28 DIAGNOSIS — Z823 Family history of stroke: Secondary | ICD-10-CM | POA: Diagnosis not present

## 2019-10-28 DIAGNOSIS — R1013 Epigastric pain: Secondary | ICD-10-CM

## 2019-10-28 DIAGNOSIS — I7100 Dissection of unspecified site of aorta: Secondary | ICD-10-CM | POA: Diagnosis present

## 2019-10-28 DIAGNOSIS — I7101 Dissection of thoracic aorta: Secondary | ICD-10-CM | POA: Diagnosis present

## 2019-10-28 DIAGNOSIS — R1010 Upper abdominal pain, unspecified: Secondary | ICD-10-CM | POA: Diagnosis present

## 2019-10-28 DIAGNOSIS — I10 Essential (primary) hypertension: Secondary | ICD-10-CM | POA: Diagnosis not present

## 2019-10-28 DIAGNOSIS — D649 Anemia, unspecified: Secondary | ICD-10-CM | POA: Diagnosis present

## 2019-10-28 DIAGNOSIS — N183 Chronic kidney disease, stage 3 unspecified: Secondary | ICD-10-CM | POA: Diagnosis present

## 2019-10-28 DIAGNOSIS — Z810 Family history of intellectual disabilities: Secondary | ICD-10-CM | POA: Diagnosis not present

## 2019-10-28 DIAGNOSIS — R109 Unspecified abdominal pain: Secondary | ICD-10-CM | POA: Diagnosis not present

## 2019-10-28 DIAGNOSIS — I7103 Dissection of thoracoabdominal aorta: Secondary | ICD-10-CM | POA: Diagnosis not present

## 2019-10-28 DIAGNOSIS — M1990 Unspecified osteoarthritis, unspecified site: Secondary | ICD-10-CM | POA: Diagnosis present

## 2019-10-28 DIAGNOSIS — I1310 Hypertensive heart and chronic kidney disease without heart failure, with stage 1 through stage 4 chronic kidney disease, or unspecified chronic kidney disease: Secondary | ICD-10-CM | POA: Diagnosis present

## 2019-10-28 DIAGNOSIS — Z20822 Contact with and (suspected) exposure to covid-19: Secondary | ICD-10-CM | POA: Diagnosis present

## 2019-10-28 DIAGNOSIS — Z8249 Family history of ischemic heart disease and other diseases of the circulatory system: Secondary | ICD-10-CM | POA: Diagnosis not present

## 2019-10-28 DIAGNOSIS — E210 Primary hyperparathyroidism: Secondary | ICD-10-CM | POA: Diagnosis present

## 2019-10-28 DIAGNOSIS — Z79899 Other long term (current) drug therapy: Secondary | ICD-10-CM | POA: Diagnosis not present

## 2019-10-28 DIAGNOSIS — F17210 Nicotine dependence, cigarettes, uncomplicated: Secondary | ICD-10-CM | POA: Diagnosis present

## 2019-10-28 DIAGNOSIS — N179 Acute kidney failure, unspecified: Secondary | ICD-10-CM | POA: Diagnosis present

## 2019-10-28 DIAGNOSIS — N184 Chronic kidney disease, stage 4 (severe): Secondary | ICD-10-CM | POA: Diagnosis present

## 2019-10-28 DIAGNOSIS — Z79891 Long term (current) use of opiate analgesic: Secondary | ICD-10-CM | POA: Diagnosis not present

## 2019-10-28 DIAGNOSIS — D631 Anemia in chronic kidney disease: Secondary | ICD-10-CM | POA: Diagnosis present

## 2019-10-28 DIAGNOSIS — Z716 Tobacco abuse counseling: Secondary | ICD-10-CM | POA: Diagnosis not present

## 2019-10-28 DIAGNOSIS — Z8379 Family history of other diseases of the digestive system: Secondary | ICD-10-CM | POA: Diagnosis not present

## 2019-10-28 LAB — URINALYSIS, ROUTINE W REFLEX MICROSCOPIC
Bacteria, UA: NONE SEEN
Bilirubin Urine: NEGATIVE
Glucose, UA: NEGATIVE mg/dL
Hgb urine dipstick: NEGATIVE
Ketones, ur: NEGATIVE mg/dL
Leukocytes,Ua: NEGATIVE
Nitrite: NEGATIVE
Protein, ur: 100 mg/dL — AB
Specific Gravity, Urine: 1.01 (ref 1.005–1.030)
pH: 6 (ref 5.0–8.0)

## 2019-10-28 LAB — BASIC METABOLIC PANEL
Anion gap: 6 (ref 5–15)
BUN: 43 mg/dL — ABNORMAL HIGH (ref 8–23)
CO2: 20 mmol/L — ABNORMAL LOW (ref 22–32)
Calcium: 9.5 mg/dL (ref 8.9–10.3)
Chloride: 113 mmol/L — ABNORMAL HIGH (ref 98–111)
Creatinine, Ser: 2.73 mg/dL — ABNORMAL HIGH (ref 0.44–1.00)
GFR calc Af Amer: 20 mL/min — ABNORMAL LOW (ref >60)
GFR calc non Af Amer: 18 mL/min — ABNORMAL LOW (ref >60)
Glucose, Bld: 106 mg/dL — ABNORMAL HIGH (ref 70–99)
Potassium: 5.1 mmol/L (ref 3.5–5.1)
Sodium: 139 mmol/L (ref 135–145)

## 2019-10-28 LAB — TYPE AND SCREEN
ABO/RH(D): B POS
Antibody Screen: NEGATIVE

## 2019-10-28 LAB — CBC
HCT: 28.8 % — ABNORMAL LOW (ref 36.0–46.0)
Hemoglobin: 8.8 g/dL — ABNORMAL LOW (ref 12.0–15.0)
MCH: 28.3 pg (ref 26.0–34.0)
MCHC: 30.6 g/dL (ref 30.0–36.0)
MCV: 92.6 fL (ref 80.0–100.0)
Platelets: 271 10*3/uL (ref 150–400)
RBC: 3.11 MIL/uL — ABNORMAL LOW (ref 3.87–5.11)
RDW: 13.7 % (ref 11.5–15.5)
WBC: 8.9 10*3/uL (ref 4.0–10.5)
nRBC: 0 % (ref 0.0–0.2)

## 2019-10-28 LAB — RESPIRATORY PANEL BY RT PCR (FLU A&B, COVID)
Influenza A by PCR: NEGATIVE
Influenza B by PCR: NEGATIVE
SARS Coronavirus 2 by RT PCR: NEGATIVE

## 2019-10-28 LAB — MRSA PCR SCREENING: MRSA by PCR: NEGATIVE

## 2019-10-28 LAB — HIV ANTIBODY (ROUTINE TESTING W REFLEX): HIV Screen 4th Generation wRfx: NONREACTIVE

## 2019-10-28 LAB — GLUCOSE, CAPILLARY: Glucose-Capillary: 95 mg/dL (ref 70–99)

## 2019-10-28 LAB — ABO/RH: ABO/RH(D): B POS

## 2019-10-28 MED ORDER — ACETAMINOPHEN 325 MG PO TABS
650.0000 mg | ORAL_TABLET | ORAL | Status: DC | PRN
  Administered 2019-10-29 – 2019-10-31 (×3): 650 mg via ORAL
  Filled 2019-10-28 (×3): qty 2

## 2019-10-28 MED ORDER — HYDROMORPHONE HCL 1 MG/ML IJ SOLN: 1.0000 mg | INTRAMUSCULAR | Status: DC | PRN

## 2019-10-28 MED ORDER — HEPARIN SODIUM (PORCINE) 5000 UNIT/ML IJ SOLN
5000.0000 [IU] | Freq: Three times a day (TID) | INTRAMUSCULAR | Status: DC
  Administered 2019-10-28 – 2019-11-01 (×12): 5000 [IU] via SUBCUTANEOUS
  Filled 2019-10-28 (×12): qty 1

## 2019-10-28 MED ORDER — IOHEXOL 350 MG/ML SOLN
100.0000 mL | Freq: Once | INTRAVENOUS | Status: AC | PRN
  Administered 2019-10-28: 100 mL via INTRAVENOUS

## 2019-10-28 MED ORDER — OXYCODONE-ACETAMINOPHEN 5-325 MG PO TABS
1.0000 | ORAL_TABLET | Freq: Four times a day (QID) | ORAL | Status: DC | PRN
  Administered 2019-10-28 – 2019-10-31 (×4): 1 via ORAL
  Administered 2019-11-01: 2 via ORAL
  Filled 2019-10-28 (×2): qty 1
  Filled 2019-10-28: qty 2
  Filled 2019-10-28 (×2): qty 1

## 2019-10-28 MED ORDER — SODIUM CHLORIDE 0.9 % IV BOLUS
500.0000 mL | Freq: Once | INTRAVENOUS | Status: AC
  Administered 2019-10-28: 500 mL via INTRAVENOUS

## 2019-10-28 MED ORDER — AMLODIPINE BESYLATE 10 MG PO TABS
10.0000 mg | ORAL_TABLET | Freq: Every day | ORAL | Status: DC
  Administered 2019-10-28 – 2019-11-01 (×5): 10 mg via ORAL
  Filled 2019-10-28 (×5): qty 1

## 2019-10-28 MED ORDER — CHLORHEXIDINE GLUCONATE CLOTH 2 % EX PADS
6.0000 | MEDICATED_PAD | Freq: Every day | CUTANEOUS | Status: DC
  Administered 2019-10-28 – 2019-10-31 (×2): 6 via TOPICAL

## 2019-10-28 MED ORDER — FENTANYL CITRATE (PF) 100 MCG/2ML IJ SOLN
50.0000 ug | Freq: Once | INTRAMUSCULAR | Status: AC
  Administered 2019-10-28: 50 ug via INTRAVENOUS
  Filled 2019-10-28: qty 2

## 2019-10-28 MED ORDER — NICARDIPINE HCL IN NACL 20-0.86 MG/200ML-% IV SOLN
3.0000 mg/h | INTRAVENOUS | Status: DC
  Administered 2019-10-28: 14 mg/h via INTRAVENOUS
  Administered 2019-10-28: 5 mg/h via INTRAVENOUS
  Filled 2019-10-28 (×5): qty 200

## 2019-10-28 MED ORDER — LACTATED RINGERS IV SOLN: INTRAVENOUS | Status: DC

## 2019-10-28 MED ORDER — ONDANSETRON HCL 4 MG/2ML IJ SOLN: 4.0000 mg | Freq: Four times a day (QID) | INTRAMUSCULAR | Status: DC | PRN

## 2019-10-28 MED ORDER — IRBESARTAN 150 MG PO TABS
300.0000 mg | ORAL_TABLET | Freq: Every day | ORAL | Status: DC
  Administered 2019-10-28 – 2019-11-01 (×5): 300 mg via ORAL
  Filled 2019-10-28 (×7): qty 2

## 2019-10-28 MED ORDER — ESMOLOL HCL-SODIUM CHLORIDE 2000 MG/100ML IV SOLN
25.0000 ug/kg/min | INTRAVENOUS | Status: DC
  Administered 2019-10-28: 25 ug/kg/min via INTRAVENOUS
  Filled 2019-10-28 (×2): qty 100

## 2019-10-28 NOTE — Care Management (Addendum)
This is a no charge note  PCCM pick per Dr. Mortimer Fries  64 year old lady with past medical history of AAA, hypertension, hyperlipidemia, asthma, CKD stage 3-4, tobacco abuse, iron deficiency anemia, who presents with chest pain and back pain, was found to have type B aortic dissection.  Patient was started with Nicardipin drip and esmolol drip. Admitted to ICU. [facility] was consulted. Per Dr. Mortimer Fries, will do medical treatment for now.  We need to pick up this pt tomorrow morning (1/14).   Ivor Costa, MD  [facility]   If 7PM-7AM, please contact night-coverage [email or web address] Password [facility] 10/28/2019, 2:30 PM

## 2019-10-28 NOTE — ED Notes (Signed)
Pt provided breakfast tray; able to feed self. 

## 2019-10-28 NOTE — H&P (Signed)
Name: Elizabeth Roy MRN: [id number] DOB: 1956/06/16    ADMISSION DATE:  10/27/2019 CONSULTATION DATE:  10/28/2019  REFERRING MD :  Dr. Alfred Levins  CHIEF COMPLAINT:  Abdominal pain  BRIEF PATIENT DESCRIPTION:  64 y.o. with past medical history of thoracic AAA, CKD stage IV, hypertension admitted 10/28/2019 with type B thoracic aortic dissection requiring esmolol and nicardipine infusions for strict heart rate and blood pressure control.  Vascular surgery consulted who recommends medical management for now.  SIGNIFICANT EVENTS  1/12>> presents to ED  STUDIES:  Chest x-ray 1/12>>Heart is enlarged. Tortuous, diffusely aneurysmal thoracic aorta. Lungs clear. No effusions. No acute bony abnormality. CT chest, abdomen and pelvis without contrast 1/13>>1. This study is positive for Thoracic Aortic Dissection despite the absence of IV contrast. A new dissection flap since 2019 is evident in the arch and the descending thoracic aorta. No flap is evident in the ascending aorta, favoring a Stanford type B dissection. The distal extent of the flap is unclear, but the abdominal aorta might be spared. 2. Subsequent increased fusiform aneurysmal enlargement of the arch since 2019 from 34 mm then to 40 mm now. But the larger descending and thoracoabdominal aortic aneurysmal segments appear stable since 2019 approximately 52-54 mm diameter. 3. No evidence of aortic rupture on this non-contrast exam. 4. Recommend Vascular Surgery Consultation to determine the next best diagnostic and treatment steps. CTA chest/abdomen and pelvis 1/13>>1. Confirmed Stanford type B Aortic Dissection which begins just distal to the left subclavian artery origin, continues into the proximal abdominal aorta, and terminates at the origin of the left renal artery. 2. Celiac, SMA, and main renal arteries arise from the true lumen. No periaortic hematoma. Underlying thoracoabdominal aortic aneurysm as detailed earlier  tonight. 3. Otherwise stable CT appearance of the chest, abdomen, and pelvis from the 0159 hour study today; there is a trace left pleural effusion.  CULTURES: SARS-CoV-2 PCR 1/13>> negative Influenza PCR 1/13>> negative  ANTIBIOTICS: N/A  HISTORY OF PRESENT ILLNESS:   Elizabeth Roy is a 64 year old female with a past medical history notable for thoracic AAA, smoking, hypertension who presents to [facility] ED on 10/27/2019 with complaints of abdominal pain.  She reports the abdominal pain started shortly today after she finished eating when she initially thought it might be indigestion.  However the pain was more intense and persisted longer than she expected.  The pain was located across her upper abdomen with radiation toward the back.  She denies any chest pain, shortness of breath, pulsatile abdominal mass, nausea, vomiting, diarrhea, fever, chills, sick contacts.  Initial work-up in the ED revealed hemoglobin 9.2, bicarb 20, creatinine 3.14, BUN 46, glucose 125, and normal LFTs.  Abdominal ultrasound negative for gall stones.  Given concern for possible dissection, CT chest, abdomen, and pelvis without contrast were obtained which revealed type II aortic dissection, but was unable to see the ascending aorta as well without contrast.  Vascular surgery was consulted to assess whether contrast could be given due to AKI.  Vascular surgery recommended giving contrast to rule out type A dissection, in case need for transfer for for surgical repair.  CTA chest, abdomen and pelvis confirmed type B aortic dissection.  Vascular surgery recommended admission to ICU with medical management including esmolol and nicardipine drips for strict heart rate and blood pressure control.  PCCM is consulted for admission.  PAST MEDICAL HISTORY :   has a past medical history of Arthritis, Hypertension, and Renal disorder.  has a past surgical history that  includes Mouth surgery. Prior to Admission medications   Medication  Sig Start Date End Date Taking? Authorizing Provider  amLODipine (NORVASC) 10 MG tablet Take 1 tablet (10 mg total) by mouth daily. 09/04/19   Ronnell Freshwater, NP  bisoprolol (ZEBETA) 5 MG tablet Take 0.5 tablets (2.5 mg total) by mouth at bedtime. 09/14/19   Ronnell Freshwater, NP  olmesartan (BENICAR) 40 MG tablet Take 1 tablet (40 mg total) by mouth at bedtime. 09/14/19   Ronnell Freshwater, NP  Olopatadine HCl 0.2 % SOLN Place 1 drop into both eyes daily. 05/11/19   Ronnell Freshwater, NP  traMADol (ULTRAM) 50 MG tablet Take 50 mg by mouth daily as needed for moderate pain.    [provider]   No Known Allergies  FAMILY HISTORY:  family history includes Hypertension in her mother; Intellectual disability in her sister; Stroke in her sister. SOCIAL HISTORY:  reports that she has been smoking cigarettes. She has never used smokeless tobacco. She reports that she does not drink alcohol or use drugs.   COVID-19 DISASTER DECLARATION:  FULL CONTACT PHYSICAL EXAMINATION WAS NOT POSSIBLE DUE TO TREATMENT OF COVID-19 AND  CONSERVATION OF PERSONAL PROTECTIVE EQUIPMENT, LIMITED EXAM FINDINGS INCLUDE-  Patient assessed or the symptoms described in the history of present illness.  In the context of the Global COVID-19 pandemic, which necessitated consideration that the patient might be at risk for infection with the SARS-CoV-2 virus that causes COVID-19, Institutional protocols and algorithms that pertain to the evaluation of patients at risk for COVID-19 are in a state of rapid change based on information released by regulatory bodies including the CDC and federal and state organizations. These policies and algorithms were followed during the patient's care while in hospital.  REVIEW OF SYSTEMS: Positives in bold Constitutional: Negative for fever, chills, weight loss, malaise/fatigue and diaphoresis.  HENT: Negative for hearing loss, ear pain, nosebleeds, congestion, sore throat, neck  pain, tinnitus and ear discharge.   Eyes: Negative for blurred vision, double vision, photophobia, pain, discharge and redness.  Respiratory: Negative for cough, hemoptysis, sputum production, shortness of breath, wheezing and stridor.   Cardiovascular: Negative for chest pain, palpitations, orthopnea, claudication, leg swelling and PND.  Gastrointestinal: Negative for heartburn, nausea, vomiting, +abdominal pain, diarrhea, constipation, blood in stool and melena.  Genitourinary: Negative for dysuria, urgency, frequency, hematuria and flank pain.  Musculoskeletal: Negative for myalgias, +back pain, joint pain and falls.  Skin: Negative for itching and rash.  Neurological: Negative for dizziness, tingling, tremors, sensory change, speech change, focal weakness, seizures, loss of consciousness, weakness and headaches.  Endo/Heme/Allergies: Negative for environmental allergies and polydipsia. Does not bruise/bleed easily.  SUBJECTIVE:  Reports 5 out of 5 epigastric pain with radiation to the back Denies chest pain, shortness of breath, nausea, vomiting, diarrhea, fever, chills or sick contacts On room air Reports she is anxious about the situation  VITAL SIGNS: Temp:  [98.1 F (36.7 C)] 98.1 F (36.7 C) (01/12 1939) Pulse Rate:  [63-82] 80 (01/13 0420) Resp:  [17-29] 21 (01/13 0420) BP: (123-154)/(68-104) 125/70 (01/13 0420) SpO2:  [94 %-100 %] 97 % (01/13 0420) Weight:  [68 kg] 68 kg (01/12 1934)  PHYSICAL EXAMINATION: General: Acutely ill-appearing female, laying in bed, on room air, no acute distress Neuro: Awake, alert and oriented x4, no focal deficits, speech clear, pupils PERRLA HEENT: Atraumatic, normocephalic, neck supple, no JVD Cardiovascular: Regular rate and rhythm, S1-S2, no murmurs/rubs/gallops, 2+ pulses throughout Lungs: Clear to auscultation bilaterally, no wheezing,  even, nonlabored, normal effort Abdomen: Soft, nontender, nondistended, no guarding or rebound  tenderness, no pulsatile abdominal masses, bowel sounds positive x4 Musculoskeletal: Normal bulk and tone, no deformities, no edema Skin: Warm and dry, no obvious rashes, lesions, or ulcerations  Recent Labs  Lab 10/27/19 1939  NA 138  K 5.1  CL 109  CO2 20*  BUN 46*  CREATININE 3.14*  GLUCOSE 125*   Recent Labs  Lab 10/27/19 2010  HGB 9.2*  HCT 30.2*  WBC 8.6  PLT 275   CT ABDOMEN PELVIS WO CONTRAST  Addendum Date: 10/28/2019   ADDENDUM REPORT: 10/28/2019 02:43 ADDENDUM: Critical Value/emergent results were called by telephone at the time of interpretation on 10/28/2019 at 0230 hours to Dr. Rudene Re , who verbally acknowledged these results. Electronically Signed   By: Genevie Ann M.D.   On: 10/28/2019 02:43   Result Date: 10/28/2019 CLINICAL DATA:  64 year old female with known thoracoabdominal aortic aneurysm and chest pain radiating to the back. Creatinine greater than 3 such that IV contrast is deferred at this time. EXAM: CT CHEST, ABDOMEN AND PELVIS WITHOUT CONTRAST TECHNIQUE: Multidetector CT imaging of the chest, abdomen and pelvis was performed following the standard protocol without IV contrast. COMPARISON:  Prior noncontrast chest and abdominal/pelvic CTs 05/21/2018 and earlier. No prior contrast enhanced CTs in our system since 1997. FINDINGS: CT CHEST FINDINGS Cardiovascular: Chronic fusiform enlargement of the thoracic aorta, caliber in the arch appears increased since 2019 from approximately 34 millimeters at that time to 40 millimeters now. Furthermore, with narrow CT windows a dissection flap is visible in the arch and descending aorta (series 2, image 23). Even with narrow windows no flap is evident in the ascending aorta or proximal to the great vessel origins. Aneurysmal caliber of the descending thoracic aorta appears not significantly changed from 2019 up to 53 millimeters (series 2, image 35). Stable mild cardiomegaly with no pericardial effusion. Calcified  coronary artery atherosclerosis. No periaortic hematoma is evident. Mediastinum/Nodes: Negative.  No lymphadenopathy. Lungs/Pleura: Major airways are patent. There is increased left lower lobe pulmonary opacity since 2018 that most resembles atelectasis. No definite pleural effusion. No other acute pulmonary opacity. Musculoskeletal: No acute osseous abnormality identified. CT ABDOMEN PELVIS FINDINGS Hepatobiliary: Negative noncontrast liver and gallbladder. Pancreas: Negative. Spleen: Negative. Adrenals/Urinary Tract: Normal adrenal glands. Chronic renal cysts appear stable since 2019. No hydronephrosis. Mildly distended but otherwise unremarkable urinary bladder. Stomach/Bowel: No dilated large or small bowel in the abdomen. Mild motion artifact in the lower abdomen. No free air or free fluid. Vascular/Lymphatic: The dissection flap visible in the descending thoracic aorta on these noncontrast images cannot be delineated in the abdomen. Fusiform aneurysmal enlargement of the aorta at the level of the diaphragm has mildly increased since 2019 from 52-54 millimeters. Ectasia of the remaining abdominal aorta appears stable. Superimposed Aortoiliac calcified atherosclerosis. No lymphadenopathy. Reproductive: Negative noncontrast appearance. Other: No pelvic free fluid. Musculoskeletal: No acute osseous abnormality identified. IMPRESSION: 1. This study is positive for Thoracic Aortic Dissection despite the absence of IV contrast. A new dissection flap since 2019 is evident in the arch and the descending thoracic aorta. No flap is evident in the ascending aorta, favoring a Stanford type B dissection. The distal extent of the flap is unclear, but the abdominal aorta might be spared. 2. Subsequent increased fusiform aneurysmal enlargement of the arch since 2019 from 34 mm then to 40 mm now. But the larger descending and thoracoabdominal aortic aneurysmal segments appear stable since 2019 approximately 52-54  mm diameter.  3. No evidence of aortic rupture on this non-contrast exam. 4. Recommend Vascular Surgery Consultation to determine the next best diagnostic and treatment steps. Electronically Signed: By: Genevie Ann M.D. On: 10/28/2019 02:25   DG Chest 2 View  Result Date: 10/27/2019 CLINICAL DATA:  Chest pain EXAM: CHEST - 2 VIEW COMPARISON:  CT 11/18/2014 FINDINGS: Heart is enlarged. Tortuous, diffusely aneurysmal thoracic aorta. Lungs clear. No effusions. No acute bony abnormality. IMPRESSION: No acute cardiopulmonary disease. Cardiomegaly, diffusely aneurysmal thoracic aorta. This is similar to prior CT. Electronically Signed   By: Rolm Baptise M.D.   On: 10/27/2019 20:04   CT Chest Wo Contrast  Addendum Date: 10/28/2019   ADDENDUM REPORT: 10/28/2019 02:43 ADDENDUM: Critical Value/emergent results were called by telephone at the time of interpretation on 10/28/2019 at 0230 hours to Dr. Rudene Re , who verbally acknowledged these results. Electronically Signed   By: Genevie Ann M.D.   On: 10/28/2019 02:43   Result Date: 10/28/2019 CLINICAL DATA:  64 year old female with known thoracoabdominal aortic aneurysm and chest pain radiating to the back. Creatinine greater than 3 such that IV contrast is deferred at this time. EXAM: CT CHEST, ABDOMEN AND PELVIS WITHOUT CONTRAST TECHNIQUE: Multidetector CT imaging of the chest, abdomen and pelvis was performed following the standard protocol without IV contrast. COMPARISON:  Prior noncontrast chest and abdominal/pelvic CTs 05/21/2018 and earlier. No prior contrast enhanced CTs in our system since 1997. FINDINGS: CT CHEST FINDINGS Cardiovascular: Chronic fusiform enlargement of the thoracic aorta, caliber in the arch appears increased since 2019 from approximately 34 millimeters at that time to 40 millimeters now. Furthermore, with narrow CT windows a dissection flap is visible in the arch and descending aorta (series 2, image 23). Even with narrow windows no flap is evident in  the ascending aorta or proximal to the great vessel origins. Aneurysmal caliber of the descending thoracic aorta appears not significantly changed from 2019 up to 53 millimeters (series 2, image 35). Stable mild cardiomegaly with no pericardial effusion. Calcified coronary artery atherosclerosis. No periaortic hematoma is evident. Mediastinum/Nodes: Negative.  No lymphadenopathy. Lungs/Pleura: Major airways are patent. There is increased left lower lobe pulmonary opacity since 2018 that most resembles atelectasis. No definite pleural effusion. No other acute pulmonary opacity. Musculoskeletal: No acute osseous abnormality identified. CT ABDOMEN PELVIS FINDINGS Hepatobiliary: Negative noncontrast liver and gallbladder. Pancreas: Negative. Spleen: Negative. Adrenals/Urinary Tract: Normal adrenal glands. Chronic renal cysts appear stable since 2019. No hydronephrosis. Mildly distended but otherwise unremarkable urinary bladder. Stomach/Bowel: No dilated large or small bowel in the abdomen. Mild motion artifact in the lower abdomen. No free air or free fluid. Vascular/Lymphatic: The dissection flap visible in the descending thoracic aorta on these noncontrast images cannot be delineated in the abdomen. Fusiform aneurysmal enlargement of the aorta at the level of the diaphragm has mildly increased since 2019 from 52-54 millimeters. Ectasia of the remaining abdominal aorta appears stable. Superimposed Aortoiliac calcified atherosclerosis. No lymphadenopathy. Reproductive: Negative noncontrast appearance. Other: No pelvic free fluid. Musculoskeletal: No acute osseous abnormality identified. IMPRESSION: 1. This study is positive for Thoracic Aortic Dissection despite the absence of IV contrast. A new dissection flap since 2019 is evident in the arch and the descending thoracic aorta. No flap is evident in the ascending aorta, favoring a Stanford type B dissection. The distal extent of the flap is unclear, but the abdominal  aorta might be spared. 2. Subsequent increased fusiform aneurysmal enlargement of the arch since 2019 from 34 mm then  to 40 mm now. But the larger descending and thoracoabdominal aortic aneurysmal segments appear stable since 2019 approximately 52-54 mm diameter. 3. No evidence of aortic rupture on this non-contrast exam. 4. Recommend Vascular Surgery Consultation to determine the next best diagnostic and treatment steps. Electronically Signed: By: Genevie Ann M.D. On: 10/28/2019 02:25   CT Angio Chest/Abd/Pel for Dissection W and/or Wo Contrast  Addendum Date: 10/28/2019   ADDENDUM REPORT: 10/28/2019 03:50 ADDENDUM: Study discussed by telephone with Dr. Rudene Re on 10/28/2019 at [place]. Electronically Signed   By: Genevie Ann M.D.   On: 10/28/2019 03:50   Result Date: 10/28/2019 CLINICAL DATA:  64 year old female with history of chronic kidney disease and chronic thoracoabdominal aneurysm. Acute pain radiating to the back, and evidence of new dissection since 2019 in the thoracic aorta on noncontrast exam tonight. EXAM: CT ANGIOGRAPHY CHEST, ABDOMEN AND PELVIS TECHNIQUE: Multidetector CT imaging through the chest, abdomen and pelvis was performed using the standard protocol during bolus administration of intravenous contrast. Multiplanar reconstructed images and MIPs were obtained and reviewed to evaluate the vascular anatomy. CONTRAST:  143mL OMNIPAQUE IOHEXOL 350 MG/ML SOLN COMPARISON:  Noncontrast CT Chest, Abdomen, and Pelvis earlier tonight. FINDINGS: CTA CHEST FINDINGS Cardiovascular: Good contrast timing in the aorta. These images confirm a Stanford type B aortic dissection which begins immediately posterior to the left subclavian artery origin on series 4, image 21. The dissection flap continues through the descending aorta and thoracoabdominal aortic aneurysm into the proximal abdominal aorta as detailed below. No periaortic hematoma. Stable cardiac size. No pericardial effusion. There is a trace  left pleural effusion evident with the addition of IV contrast, but stable. Proximal great vessels appear patent without dissection. Central pulmonary arteries also appear patent. Otherwise stable CT appearance of the chest from 0159 hours today. Review of the MIP images confirms the above findings. CTA ABDOMEN AND PELVIS FINDINGS VASCULAR The dissection continues into the upper abdominal aorta and the true lumen is in the midline with late or nonenhancing false lumen along both lateral walls (series 4, image 88). The celiac arises from the true lumen. The SMA arises from the true lumen. The dissection then terminates near the left main renal artery origin (series 4, images 96-98). Both main renal arteries are enhancing. The infrarenal and iliofemoral arteries are patent and stable since 2019 with calcified atherosclerosis. No periaortic hematoma. Review of the MIP images confirms the above findings. NON-VASCULAR Satisfactory appearance of liver, spleen, pancreas and adrenal gland enhancement. Symmetric appearing renal enhancement with superimposed chronic renal cysts. Otherwise stable CT appearance of the abdomen and pelvis from 0159 hours today. Review of the MIP images confirms the above findings. IMPRESSION: 1. Confirmed Stanford type B Aortic Dissection which begins just distal to the left subclavian artery origin, continues into the proximal abdominal aorta, and terminates at the origin of the left renal artery. 2. Celiac, SMA, and main renal arteries arise from the true lumen. No periaortic hematoma. Underlying thoracoabdominal aortic aneurysm as detailed earlier tonight. 3. Otherwise stable CT appearance of the chest, abdomen, and pelvis from the 0159 hour study today; there is a trace left pleural effusion. Electronically Signed: By: Genevie Ann M.D. On: 10/28/2019 03:28   US Abdomen Limited RUQ  Result Date: 10/28/2019 CLINICAL DATA:  Epigastric pain EXAM: ULTRASOUND ABDOMEN LIMITED RIGHT UPPER QUADRANT  COMPARISON:  CT May 21, 2018 FINDINGS: Gallbladder: No gallstones or wall thickening visualized. No sonographic Murphy sign noted by sonographer. Common bile duct: Diameter: 2.8 mm  Liver: No focal lesion identified. Within normal limits in parenchymal echogenicity. Portal vein is patent on color Doppler imaging with normal direction of blood flow towards the liver. Other: There is incidental note made of a proximal aortic aneurysm measuring up to 4.8 cm, with internal echogenicity which could be calcified or noncalcified atherosclerosis. IMPRESSION: 1. Normal liver and gallbladder. 2. Proximal aortic aneurysm measuring 4.8 cm, grossly unchanged given differences in technique. Recommend followup by abdomen and pelvis CTA in 6 months. This recommendation follows ACR consensus guidelines: White Paper of the ACR Incidental Findings Committee II on Vascular Findings. J Am Coll Radiol 2013; 10:789-794. Aortic aneurysm NOS (ICD10-I71.9) Electronically Signed   By: Prudencio Pair M.D.   On: 10/28/2019 01:03    ASSESSMENT / PLAN:  Type B Aortic Dissection Hx: Thoracic AAA, hypertension -ICU monitoring -Esmolol & Nicardipine drips to maintain HR <60 & SBP 100-120 -Vascular surgery consulted, appreciate input ~ ED provider spoke with Vascular Surgery who recommends medical management for now -Pain control  AKI superimposed on CKD stage IV (baseline creatinine 2.88/GFR 19) -Monitor I&O's / urinary output -Follow BMP -Ensure adequate renal perfusion -Avoid nephrotoxic agents as able -Replace electrolytes as indicated -IV fluids  Anemia -Monitor for S/Sx of bleeding -Trend CBC -SCD's for VTE Prophylaxis  -Transfuse for Hgb <7               Disposition: ICU Goals of care: Full code VTE prophylaxis: SCDs Updates: Updated patient at bedside 10/28/2019  Darel Hong, [facility] Pager: [phone]   10/28/2019, 4:40 AM

## 2019-10-28 NOTE — ED Notes (Signed)
Patient transported to CT 

## 2019-10-28 NOTE — Consult Note (Signed)
[facility] Consult Note  MRN : [id number]  Elizabeth Roy is a 64 y.o. (1956-06-21) female who presents with chief complaint of  Chief Complaint  Patient presents with  . Back Pain  . Chest Pain   History of Present Illness:  The patient is a 64 year old female with multiple medical issues (see below) who presented to the [facility] emergency department via EMS complaining of "abdominal pain".  Patient endorses a history of initially believing her discomfort was "indigestion" however her pain persisted which prompted her to seek medical attention.  She denies any associated nausea vomiting.  No changes in bowel habits.  She notes that the abdominal discomfort radiates towards her back.  She denies any chest pain or shortness of breath.  Denies any fever.  05/27/18: Patient seen in outpatient setting by Dr. 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  She also has some dilatation of the ascending thoracic aorta although not as severe". Recommended that she be seen by Dr. Sammuel Hines at [facility] who specializes in fenestrated stent grafts.  Initially, seen by Dr. Sammuel Hines on July 03, 2018.  The patient has been compliant with 83-month follow-ups. Last visit with Dr. Sammuel Hines on 08/13/19.   Abdominal Ultrasound (10/28/19): Proximal aortic aneurysm measuring 4.8 cm, grossly unchanged.  CTA chest / Abdomen / Pelvis (10/28/19): 1. A new dissection flap since 2019 is evident in the arch and the descending thoracic aorta. No flap is evident in the ascending aorta, favoring a Stanford type B dissection. The distal extent of the flap is unclear, but the abdominal aorta might be spared. 2. Subsequent increased fusiform aneurysmal enlargement of the arch since 2019 from 34 mm then to 40  mm now. But the larger descending and thoracoabdominal aortic aneurysmal segments appear stable since 2019 approximately 52-54 mm diameter. 3. No evidence of aortic rupture on this non-contrast exam.  Vascular Surgery was consulted by NP Dewaine Conger for further recommendations.  Current Facility-Administered Medications  Medication Dose Route Frequency Provider Last Rate Last Admin  . acetaminophen (TYLENOL) tablet 650 mg  650 mg Oral Q4H PRN Darel Hong D, NP      . amLODipine (NORVASC) tablet 10 mg  10 mg Oral Daily Flora Lipps, MD      . esmolol (BREVIBLOC) 2000 mg / 100 mL (20 mg/mL) infusion  25-300 mcg/kg/min Intravenous Continuous Alfred Levins, [place], MD 20.4 mL/hr at 10/28/19 0800 100 mcg/kg/min at 10/28/19 0800  . heparin injection 5,000 Units  5,000 Units Subcutaneous Q8H Kasa, Kurian, MD      . HYDROmorphone (DILAUDID) injection 1 mg  1 mg Intravenous Q4H PRN Darel Hong D, NP      . irbesartan (AVAPRO) tablet 300 mg  300 mg Oral Daily Flora Lipps, MD      . lactated ringers infusion   Intravenous Continuous Bradly Bienenstock, NP 50 mL/hr at 10/28/19 0457 New Bag at 10/28/19 0457  . nicardipine (CARDENE) 20mg  in 0.86% saline 22ml IV infusion (0.1 mg/ml)  3-15 mg/hr Intravenous Continuous Alfred Levins, [place], MD 100 mL/hr at 10/28/19 0912 10 mg/hr at 10/28/19 0912  . ondansetron (ZOFRAN) injection 4 mg  4 mg Intravenous Q6H PRN Darel Hong D, NP      . oxyCODONE-acetaminophen (PERCOCET/ROXICET) 5-325 MG per tablet 1-2 tablet  1-2 tablet Oral Q6H PRN Bradly Bienenstock, NP  1 tablet at 10/28/19 1124   Current Outpatient Medications  Medication Sig Dispense Refill  . amLODipine (NORVASC) 10 MG tablet Take 1 tablet (10 mg total) by mouth daily. 90 tablet 1  . bisoprolol (ZEBETA) 5 MG tablet Take 0.5 tablets (2.5 mg total) by mouth at bedtime. (Patient taking differently: Take 5 mg by mouth at bedtime. ) 45 tablet 1  . olmesartan (BENICAR) 40 MG tablet Take 1 tablet (40 mg total)  by mouth at bedtime. 90 tablet 3  . Olopatadine HCl 0.2 % SOLN Place 1 drop into both eyes daily. 2.5 mL 5   Past Medical History:  Diagnosis Date  . Arthritis   . Hypertension   . Renal disorder    Past Surgical History:  Procedure Laterality Date  . MOUTH SURGERY     Social History Social History   Tobacco Use  . Smoking status: Current Some Day Smoker    Types: Cigarettes  . Smokeless tobacco: Never Used  . Tobacco comment: sometimes  Substance Use Topics  . Alcohol use: Never  . Drug use: No   Family History Family History  Problem Relation Age of Onset  . Hypertension Mother   . Intellectual disability Sister   . Stroke Sister   Denies family history of peripheral artery disease, renal disease or aneurysmal disease.  No Known Allergies  REVIEW OF SYSTEMS (Negative unless checked)  Constitutional: [] Weight loss  [] Fever  [] Chills Cardiac: [] Chest pain   [] Chest pressure   [] Palpitations   [] Shortness of breath when laying flat   [] Shortness of breath at rest   [] Shortness of breath with exertion. Vascular:  [] Pain in legs with walking   [] Pain in legs at rest   [] Pain in legs when laying flat   [] Claudication   [] Pain in feet when walking  [] Pain in feet at rest  [] Pain in feet when laying flat   [] History of DVT   [] Phlebitis   [] Swelling in legs   [] Varicose veins   [] Non-healing ulcers Pulmonary:   [] Uses home oxygen   [] Productive cough   [] Hemoptysis   [] Wheeze  [] COPD   [] Asthma Neurologic:  [] Dizziness  [] Blackouts   [] Seizures   [] History of stroke   [] History of TIA  [] Aphasia   [] Temporary blindness   [] Dysphagia   [] Weakness or numbness in arms   [] Weakness or numbness in legs Musculoskeletal:  [] Arthritis   [] Joint swelling   [] Joint pain   [] Low back pain Hematologic:  [] Easy bruising  [] Easy bleeding   [] Hypercoagulable state   [] Anemic  [] Hepatitis Gastrointestinal:  [] Blood in stool   [] Vomiting blood  [] Gastroesophageal reflux/heartburn   [] Difficulty  swallowing. Genitourinary:  [x] Chronic kidney disease   [] Difficult urination  [] Frequent urination  [] Burning with urination   [] Blood in urine Skin:  [] Rashes   [] Ulcers   [] Wounds Psychological:  [] History of anxiety   []  History of major depression.  Known thoracoabdominal aneurysm  Physical Examination  Vitals:   10/28/19 0930 10/28/19 1010 10/28/19 1030 10/28/19 1100  BP: 124/81 111/76 120/78 107/71  Pulse: 65 72 66 63  Resp: (!) 21 (!) 22 19 14   Temp:      TempSrc:      SpO2: 98% 99% 100% 94%  Weight:      Height:       Body mass index is 30.3 kg/m. Gen:  WD/WN, NAD Head: [place]/AT, No temporalis wasting. Prominent temp pulse not noted. Ear/Nose/Throat: Hearing grossly intact, nares w/o erythema or drainage, oropharynx w/o  Erythema/Exudate Eyes: Sclera non-icteric, conjunctiva clear Neck: Trachea midline.  No JVD.  Pulmonary:  Good air movement, respirations not labored, equal bilaterally.  Cardiac: RRR, normal S1, S2. Vascular:  Vessel Right Left  Radial Palpable Palpable  Ulnar Palpable Palpable  Brachial Palpable Palpable                           Gastrointestinal: soft, non-tender/non-distended. No guarding/reflex.  Musculoskeletal: M/S 5/5 throughout.  Extremities without ischemic changes.  No deformity or atrophy. No edema. Neurologic: Sensation grossly intact in extremities.  Symmetrical.  Speech is fluent. Motor exam as listed above. Psychiatric: Judgment intact, Mood & affect appropriate for pt's clinical situation. Dermatologic: No rashes or ulcers noted.  No cellulitis or open wounds. Lymph : No Cervical, Axillary, or Inguinal lymphadenopathy.  CBC Lab Results  Component Value Date   WBC 8.9 10/28/2019   HGB 8.8 (L) 10/28/2019   HCT 28.8 (L) 10/28/2019   MCV 92.6 10/28/2019   PLT 271 10/28/2019   BMET    Component Value Date/Time   NA 139 10/28/2019 0455   K 5.1 10/28/2019 0455   CL 113 (H) 10/28/2019 0455   CO2 20 (L) 10/28/2019 0455    GLUCOSE 106 (H) 10/28/2019 0455   BUN 43 (H) 10/28/2019 0455   CREATININE 2.73 (H) 10/28/2019 0455   CALCIUM 9.5 10/28/2019 0455   GFRNONAA 18 (L) 10/28/2019 0455   GFRAA 20 (L) 10/28/2019 0455   Estimated Creatinine Clearance: 17.2 mL/min (A) (by C-G formula based on SCr of 2.73 mg/dL (H)).  COAG No results found for: INR, PROTIME  Radiology CT ABDOMEN PELVIS WO CONTRAST  Addendum Date: 10/28/2019   ADDENDUM REPORT: 10/28/2019 02:43 ADDENDUM: Critical Value/emergent results were called by telephone at the time of interpretation on 10/28/2019 at 0230 hours to Dr. Rudene Re , who verbally acknowledged these results. Electronically Signed   By: Genevie Ann M.D.   On: 10/28/2019 02:43   Result Date: 10/28/2019 CLINICAL DATA:  64 year old female with known thoracoabdominal aortic aneurysm and chest pain radiating to the back. Creatinine greater than 3 such that IV contrast is deferred at this time. EXAM: CT CHEST, ABDOMEN AND PELVIS WITHOUT CONTRAST TECHNIQUE: Multidetector CT imaging of the chest, abdomen and pelvis was performed following the standard protocol without IV contrast. COMPARISON:  Prior noncontrast chest and abdominal/pelvic CTs 05/21/2018 and earlier. No prior contrast enhanced CTs in our system since 1997. FINDINGS: CT CHEST FINDINGS Cardiovascular: Chronic fusiform enlargement of the thoracic aorta, caliber in the arch appears increased since 2019 from approximately 34 millimeters at that time to 40 millimeters now. Furthermore, with narrow CT windows a dissection flap is visible in the arch and descending aorta (series 2, image 23). Even with narrow windows no flap is evident in the ascending aorta or proximal to the great vessel origins. Aneurysmal caliber of the descending thoracic aorta appears not significantly changed from 2019 up to 53 millimeters (series 2, image 35). Stable mild cardiomegaly with no pericardial effusion. Calcified coronary artery atherosclerosis. No  periaortic hematoma is evident. Mediastinum/Nodes: Negative.  No lymphadenopathy. Lungs/Pleura: Major airways are patent. There is increased left lower lobe pulmonary opacity since 2018 that most resembles atelectasis. No definite pleural effusion. No other acute pulmonary opacity. Musculoskeletal: No acute osseous abnormality identified. CT ABDOMEN PELVIS FINDINGS Hepatobiliary: Negative noncontrast liver and gallbladder. Pancreas: Negative. Spleen: Negative. Adrenals/Urinary Tract: Normal adrenal glands. Chronic renal cysts appear stable since 2019. No hydronephrosis. Mildly distended but  otherwise unremarkable urinary bladder. Stomach/Bowel: No dilated large or small bowel in the abdomen. Mild motion artifact in the lower abdomen. No free air or free fluid. Vascular/Lymphatic: The dissection flap visible in the descending thoracic aorta on these noncontrast images cannot be delineated in the abdomen. Fusiform aneurysmal enlargement of the aorta at the level of the diaphragm has mildly increased since 2019 from 52-54 millimeters. Ectasia of the remaining abdominal aorta appears stable. Superimposed Aortoiliac calcified atherosclerosis. No lymphadenopathy. Reproductive: Negative noncontrast appearance. Other: No pelvic free fluid. Musculoskeletal: No acute osseous abnormality identified. IMPRESSION: 1. This study is positive for Thoracic Aortic Dissection despite the absence of IV contrast. A new dissection flap since 2019 is evident in the arch and the descending thoracic aorta. No flap is evident in the ascending aorta, favoring a Stanford type B dissection. The distal extent of the flap is unclear, but the abdominal aorta might be spared. 2. Subsequent increased fusiform aneurysmal enlargement of the arch since 2019 from 34 mm then to 40 mm now. But the larger descending and thoracoabdominal aortic aneurysmal segments appear stable since 2019 approximately 52-54 mm diameter. 3. No evidence of aortic rupture on  this non-contrast exam. 4. Recommend Vascular Surgery Consultation to determine the next best diagnostic and treatment steps. Electronically Signed: By: Genevie Ann M.D. On: 10/28/2019 02:25   DG Chest 2 View  Result Date: 10/27/2019 CLINICAL DATA:  Chest pain EXAM: CHEST - 2 VIEW COMPARISON:  CT 11/18/2014 FINDINGS: Heart is enlarged. Tortuous, diffusely aneurysmal thoracic aorta. Lungs clear. No effusions. No acute bony abnormality. IMPRESSION: No acute cardiopulmonary disease. Cardiomegaly, diffusely aneurysmal thoracic aorta. This is similar to prior CT. Electronically Signed   By: Rolm Baptise M.D.   On: 10/27/2019 20:04   CT Chest Wo Contrast  Addendum Date: 10/28/2019   ADDENDUM REPORT: 10/28/2019 02:43 ADDENDUM: Critical Value/emergent results were called by telephone at the time of interpretation on 10/28/2019 at 0230 hours to Dr. Rudene Re , who verbally acknowledged these results. Electronically Signed   By: Genevie Ann M.D.   On: 10/28/2019 02:43   Result Date: 10/28/2019 CLINICAL DATA:  64 year old female with known thoracoabdominal aortic aneurysm and chest pain radiating to the back. Creatinine greater than 3 such that IV contrast is deferred at this time. EXAM: CT CHEST, ABDOMEN AND PELVIS WITHOUT CONTRAST TECHNIQUE: Multidetector CT imaging of the chest, abdomen and pelvis was performed following the standard protocol without IV contrast. COMPARISON:  Prior noncontrast chest and abdominal/pelvic CTs 05/21/2018 and earlier. No prior contrast enhanced CTs in our system since 1997. FINDINGS: CT CHEST FINDINGS Cardiovascular: Chronic fusiform enlargement of the thoracic aorta, caliber in the arch appears increased since 2019 from approximately 34 millimeters at that time to 40 millimeters now. Furthermore, with narrow CT windows a dissection flap is visible in the arch and descending aorta (series 2, image 23). Even with narrow windows no flap is evident in the ascending aorta or proximal to  the great vessel origins. Aneurysmal caliber of the descending thoracic aorta appears not significantly changed from 2019 up to 53 millimeters (series 2, image 35). Stable mild cardiomegaly with no pericardial effusion. Calcified coronary artery atherosclerosis. No periaortic hematoma is evident. Mediastinum/Nodes: Negative.  No lymphadenopathy. Lungs/Pleura: Major airways are patent. There is increased left lower lobe pulmonary opacity since 2018 that most resembles atelectasis. No definite pleural effusion. No other acute pulmonary opacity. Musculoskeletal: No acute osseous abnormality identified. CT ABDOMEN PELVIS FINDINGS Hepatobiliary: Negative noncontrast liver and gallbladder. Pancreas: Negative. Spleen:  Negative. Adrenals/Urinary Tract: Normal adrenal glands. Chronic renal cysts appear stable since 2019. No hydronephrosis. Mildly distended but otherwise unremarkable urinary bladder. Stomach/Bowel: No dilated large or small bowel in the abdomen. Mild motion artifact in the lower abdomen. No free air or free fluid. Vascular/Lymphatic: The dissection flap visible in the descending thoracic aorta on these noncontrast images cannot be delineated in the abdomen. Fusiform aneurysmal enlargement of the aorta at the level of the diaphragm has mildly increased since 2019 from 52-54 millimeters. Ectasia of the remaining abdominal aorta appears stable. Superimposed Aortoiliac calcified atherosclerosis. No lymphadenopathy. Reproductive: Negative noncontrast appearance. Other: No pelvic free fluid. Musculoskeletal: No acute osseous abnormality identified. IMPRESSION: 1. This study is positive for Thoracic Aortic Dissection despite the absence of IV contrast. A new dissection flap since 2019 is evident in the arch and the descending thoracic aorta. No flap is evident in the ascending aorta, favoring a Stanford type B dissection. The distal extent of the flap is unclear, but the abdominal aorta might be spared. 2.  Subsequent increased fusiform aneurysmal enlargement of the arch since 2019 from 34 mm then to 40 mm now. But the larger descending and thoracoabdominal aortic aneurysmal segments appear stable since 2019 approximately 52-54 mm diameter. 3. No evidence of aortic rupture on this non-contrast exam. 4. Recommend Vascular Surgery Consultation to determine the next best diagnostic and treatment steps. Electronically Signed: By: Genevie Ann M.D. On: 10/28/2019 02:25   CT Angio Chest/Abd/Pel for Dissection W and/or Wo Contrast  Addendum Date: 10/28/2019   ADDENDUM REPORT: 10/28/2019 03:50 ADDENDUM: Study discussed by telephone with Dr. Rudene Re on 10/28/2019 at [place]. Electronically Signed   By: Genevie Ann M.D.   On: 10/28/2019 03:50   Result Date: 10/28/2019 CLINICAL DATA:  65 year old female with history of chronic kidney disease and chronic thoracoabdominal aneurysm. Acute pain radiating to the back, and evidence of new dissection since 2019 in the thoracic aorta on noncontrast exam tonight. EXAM: CT ANGIOGRAPHY CHEST, ABDOMEN AND PELVIS TECHNIQUE: Multidetector CT imaging through the chest, abdomen and pelvis was performed using the standard protocol during bolus administration of intravenous contrast. Multiplanar reconstructed images and MIPs were obtained and reviewed to evaluate the vascular anatomy. CONTRAST:  122mL OMNIPAQUE IOHEXOL 350 MG/ML SOLN COMPARISON:  Noncontrast CT Chest, Abdomen, and Pelvis earlier tonight. FINDINGS: CTA CHEST FINDINGS Cardiovascular: Good contrast timing in the aorta. These images confirm a Stanford type B aortic dissection which begins immediately posterior to the left subclavian artery origin on series 4, image 21. The dissection flap continues through the descending aorta and thoracoabdominal aortic aneurysm into the proximal abdominal aorta as detailed below. No periaortic hematoma. Stable cardiac size. No pericardial effusion. There is a trace left pleural effusion  evident with the addition of IV contrast, but stable. Proximal great vessels appear patent without dissection. Central pulmonary arteries also appear patent. Otherwise stable CT appearance of the chest from 0159 hours today. Review of the MIP images confirms the above findings. CTA ABDOMEN AND PELVIS FINDINGS VASCULAR The dissection continues into the upper abdominal aorta and the true lumen is in the midline with late or nonenhancing false lumen along both lateral walls (series 4, image 88). The celiac arises from the true lumen. The SMA arises from the true lumen. The dissection then terminates near the left main renal artery origin (series 4, images 96-98). Both main renal arteries are enhancing. The infrarenal and iliofemoral arteries are patent and stable since 2019 with calcified atherosclerosis. No periaortic hematoma. Review  of the MIP images confirms the above findings. NON-VASCULAR Satisfactory appearance of liver, spleen, pancreas and adrenal gland enhancement. Symmetric appearing renal enhancement with superimposed chronic renal cysts. Otherwise stable CT appearance of the abdomen and pelvis from 0159 hours today. Review of the MIP images confirms the above findings. IMPRESSION: 1. Confirmed Stanford type B Aortic Dissection which begins just distal to the left subclavian artery origin, continues into the proximal abdominal aorta, and terminates at the origin of the left renal artery. 2. Celiac, SMA, and main renal arteries arise from the true lumen. No periaortic hematoma. Underlying thoracoabdominal aortic aneurysm as detailed earlier tonight. 3. Otherwise stable CT appearance of the chest, abdomen, and pelvis from the 0159 hour study today; there is a trace left pleural effusion. Electronically Signed: By: Genevie Ann M.D. On: 10/28/2019 03:28   US Abdomen Limited RUQ  Result Date: 10/28/2019 CLINICAL DATA:  Epigastric pain EXAM: ULTRASOUND ABDOMEN LIMITED RIGHT UPPER QUADRANT COMPARISON:  CT May 21, 2018 FINDINGS: Gallbladder: No gallstones or wall thickening visualized. No sonographic Murphy sign noted by sonographer. Common bile duct: Diameter: 2.8 mm Liver: No focal lesion identified. Within normal limits in parenchymal echogenicity. Portal vein is patent on color Doppler imaging with normal direction of blood flow towards the liver. Other: There is incidental note made of a proximal aortic aneurysm measuring up to 4.8 cm, with internal echogenicity which could be calcified or noncalcified atherosclerosis. IMPRESSION: 1. Normal liver and gallbladder. 2. Proximal aortic aneurysm measuring 4.8 cm, grossly unchanged given differences in technique. Recommend followup by abdomen and pelvis CTA in 6 months. This recommendation follows ACR consensus guidelines: White Paper of the ACR Incidental Findings Committee II on Vascular Findings. J Am Coll Radiol 2013; 10:789-794. Aortic aneurysm NOS (ICD10-I71.9) Electronically Signed   By: Prudencio Pair M.D.   On: 10/28/2019 01:03   Assessment/Plan The patient is a 64 year old female with multiple medical issues (see below) who presented to the [facility] emergency department via EMS complaining of "abdominal pain".   1. Thoracoabdominal aneurysm: Known thoracoabdominal aneurysm. CTA chest / abd / pelvis (10/28/19): "A new dissection flap since 2019 is evident in the arch and the descending thoracic aorta. No flap is evident in the ascending aorta, favoring a Stanford type B dissection". Initially seen by Dr. Lucky Cowboy in 05/2018. Patient was referred to Dr. Sammuel Hines and initially seen on 09/02/18 and has been followed at six month increments, last f/u 07/2019. No indication for emergent intervention at this time. Recommend medical management with emphasis on HTN control. Recommend 120 SBP or less. Would recommend referral back to Dr. Sammuel Hines - with new dissection will most likely need repair.   2. Hypertension:  Recommend strict control. Directly  effects aneurysmal disease and growth.   3. Renal Disease: Recommend routine follow up with nephrology. Also recommend strict HTN control as this directly effects kidney function.  4. Tobacco Abuse: We had a discussion for approximately three minutes regarding the absolute need for smoking cessation due to the deleterious nature of tobacco on the vascular system. We discussed the tobacco use would diminish patency of any intervention, and likely significantly worsen progression of disease. We discussed multiple agents for quitting including replacement therapy or medications to reduce cravings such as Chantix. The patient voices their understanding of the importance of smoking cessation.  Discussed with Dr. Mayme Genta, PA-C  10/28/2019 1:07 PM   This note was created with Dragon medical transcription system.  Any error is  purely unintentional.

## 2019-10-28 NOTE — ED Notes (Signed)
Per MD Kasa, begin titrating pt off of cardene and esmolol drip at this time and maintain systolic pressure of [id number].

## 2019-10-28 NOTE — ED Notes (Signed)
U/s done in room  meds given for pain again.

## 2019-10-28 NOTE — ED Notes (Signed)
Report off to gracie  rn  

## 2019-10-28 NOTE — ED Notes (Signed)
Pt ambulatory to toilet independently. 

## 2019-10-28 NOTE — ED Provider Notes (Signed)
_________________________ 11:57 PM on 10/27/2019 -----------------------------------------  Assumed care of this patient from Dr. Archie Balboa.  Patient presented with abdominal pain.  Pending ultrasound to rule out gallbladder etiology.   _________________________ 1:28 AM on 10/28/2019 -----------------------------------------  [place] negative.  Patient reevaluated continues to complain of upper back pain, chest pain and abdominal pain.  With a history of known aortic aneurysm I am concerned for possible dissection.  Will get CT.   _________________________ 1:44 AM on 10/28/2019 ----------------------------------------- Due to patient's elevated creatinine, I was requested by the radiologist tech to get approval from a radiologist before contrast can be given.  I spoke with Dr. [place] Crane from radiology who recommended getting a CT without contrast first.  CT has been ordered.   _________________________ 2:30 AM on 10/28/2019 ----------------------------------------- Just heard back from Dr. Nevada Crane from radiology and the CT even though is without contrast is positive for at least a type II aortic dissection.  Unfortunately we cannot see the ascending aorta as well without contrast.  He recommended consulting vascular before giving patient any contrast.  _________________________ 2:46 AM on 10/28/2019 ----------------------------------------- Discussed with Dr. Lucky Cowboy from vascular surgery.  Dr. Lucky Cowboy is aware of patient's creatinine and recommended a CTA with contrast to rule out a type a dissection as this is a life-threatening pathology and patient will need emergent transfer for surgical repair.  Spoke again with Dr. Nevada Crane from radiology who authorized a CT with contrast.  Patient was very hesitant to allow contrast however explained to her the life-threatening condition that she may have and she consented.  Dr. Lucky Cowboy also recommended heart rate and blood pressure control.  Will order esmolol and  nicardipine.  _________________________ 3:41 AM on 10/28/2019 -----------------------------------------  Repeat CT with no evidence of type a dissection.  Confirms type B dissection. Per Dr. Lucky Cowboy recommendation, patient will be admitted here for medical management. Discussed with ICU NP for admission     CRITICAL CARE Performed by: Rudene Re  ?  Total critical care time: 60 min  Critical care time was exclusive of separately billable procedures and treating other patients.  Critical care was necessary to treat or prevent imminent or life-threatening deterioration.  Critical care was time spent personally by me on the following activities: development of treatment plan with patient and/or surrogate as well as nursing, discussions with consultants, evaluation of patient's response to treatment, examination of patient, obtaining history from patient or surrogate, ordering and performing treatments and interventions, ordering and review of laboratory studies, ordering and review of radiographic studies, pulse oximetry and re-evaluation of patient's condition.         Alfred Levins, [place], MD 10/28/19 0430

## 2019-10-28 NOTE — ED Notes (Signed)
ED Provider at bedside. 

## 2019-10-28 NOTE — Consult Note (Addendum)
Patient seen and examined with physician assistant Student. Agree with above plan.   Lavonia Dana, MD [facility]  1/13/20214:47 PM    [facility]  CONSULT NOTE    Date: 10/28/2019                  Patient Name:  Elizabeth Roy  MRN: [id number]  DOB: Oct 07, 1956  Age / Sex: 64 y.o., female         PCP: Ronnell Freshwater, NP                 Service Requesting Consult: ICU                  Reason for Consult: history of kidney disease in the setting of elevated creatinine             History of Present Illness: Elizabeth Roy is a 64 y.o.  female with , who was admitted to [facility] on 10/27/2019 for type B thoracic aortic dissection requiring esmolol and nicardipine infusions for strict heart rate and blood pressure control. The patient is alert orientated and did not appear to be in any type of distress. She is breathing comfortably on room air and is tolerating food without issue. Patient is producing 500 cc of urine without any complaints. She denies fever, chills, SOB, cough chest pain, abdominal pain, N,V,D or any swelling in her extremities.    Medications: Outpatient medications: Medications Prior to Admission  Medication Sig Dispense Refill Last Dose  . amLODipine (NORVASC) 10 MG tablet Take 1 tablet (10 mg total) by mouth daily. 90 tablet 1 Past Week at Unknown time  . bisoprolol (ZEBETA) 5 MG tablet Take 0.5 tablets (2.5 mg total) by mouth at bedtime. (Patient taking differently: Take 5 mg by mouth at bedtime. ) 45 tablet 1 Past Week at Unknown time  . olmesartan (BENICAR) 40 MG tablet Take 1 tablet (40 mg total) by mouth at bedtime. 90 tablet 3 Past Week at Unknown time  . Olopatadine HCl 0.2 % SOLN Place 1 drop into both eyes daily. 2.5 mL 5 Past Week at Unknown time    Current medications: Current Facility-Administered Medications  Medication Dose Route Frequency Provider Last Rate Last Admin  . acetaminophen (TYLENOL) tablet 650 mg   650 mg Oral Q4H PRN Darel Hong D, NP      . amLODipine (NORVASC) tablet 10 mg  10 mg Oral Daily Flora Lipps, MD   10 mg at 10/28/19 1345  . Chlorhexidine Gluconate Cloth 2 % PADS 6 each  6 each Topical Daily Kasa, Kurian, MD   6 each at 10/28/19 1400  . esmolol (BREVIBLOC) 2000 mg / 100 mL (20 mg/mL) infusion  25-300 mcg/kg/min Intravenous Continuous Alfred Levins, [place], MD 10.2 mL/hr at 10/28/19 1311 50 mcg/kg/min at 10/28/19 1311  . heparin injection 5,000 Units  5,000 Units Subcutaneous Q8H Flora Lipps, MD   5,000 Units at 10/28/19 1345  . HYDROmorphone (DILAUDID) injection 1 mg  1 mg Intravenous Q4H PRN Darel Hong D, NP      . irbesartan (AVAPRO) tablet 300 mg  300 mg Oral Daily Flora Lipps, MD   300 mg at 10/28/19 1411  . lactated ringers infusion   Intravenous Continuous Bradly Bienenstock, NP 50 mL/hr at 10/28/19 0457 New Bag at 10/28/19 0457  . nicardipine (CARDENE) 20mg  in 0.86% saline 21ml IV infusion (0.1 mg/ml)  3-15 mg/hr Intravenous Continuous Rudene Re, MD 25 mL/hr at 10/28/19  1311 2.5 mg/hr at 10/28/19 1311  . ondansetron (ZOFRAN) injection 4 mg  4 mg Intravenous Q6H PRN Darel Hong D, NP      . oxyCODONE-acetaminophen (PERCOCET/ROXICET) 5-325 MG per tablet 1-2 tablet  1-2 tablet Oral Q6H PRN Bradly Bienenstock, NP   1 tablet at 10/28/19 1124      Allergies: No Known Allergies    Past Medical History: Past Medical History:  Diagnosis Date  . Arthritis   . Hypertension   . Renal disorder      Past Surgical History: Past Surgical History:  Procedure Laterality Date  . MOUTH SURGERY       Family History: Family History  Problem Relation Age of Onset  . Hypertension Mother   . Intellectual disability Sister   . Stroke Sister      Social History: Social History   Socioeconomic History  . Marital status: Widowed    Spouse name: Not on file  . Number of children: Not on file  . Years of education: Not on file  . Highest education  level: Not on file  Occupational History  . Not on file  Tobacco Use  . Smoking status: Current Some Day Smoker    Types: Cigarettes  . Smokeless tobacco: Never Used  . Tobacco comment: sometimes  Substance and Sexual Activity  . Alcohol use: Never  . Drug use: No  . Sexual activity: Not on file  Other Topics Concern  . Not on file  Social History Narrative  . Not on file   Social Determinants of Health   Financial Resource Strain:   . Difficulty of Paying Living Expenses: Not on file  Food Insecurity:   . Worried About Charity fundraiser in the Last Year: Not on file  . Ran Out of Food in the Last Year: Not on file  Transportation Needs:   . Lack of Transportation (Medical): Not on file  . Lack of Transportation (Non-Medical): Not on file  Physical Activity:   . Days of Exercise per Week: Not on file  . Minutes of Exercise per Session: Not on file  Stress:   . Feeling of Stress : Not on file  Social Connections:   . Frequency of Communication with Friends and Family: Not on file  . Frequency of Social Gatherings with Friends and Family: Not on file  . Attends Religious Services: Not on file  . Active Member of Clubs or Organizations: Not on file  . Attends Archivist Meetings: Not on file  . Marital Status: Not on file  Intimate Partner Violence:   . Fear of Current or Ex-Partner: Not on file  . Emotionally Abused: Not on file  . Physically Abused: Not on file  . Sexually Abused: Not on file     Review of Systems: Review of Systems  All other systems reviewed and are negative.    Vital Signs: Blood pressure 124/74, pulse 76, temperature 98.5 F (36.9 C), resp. rate (!) 26, height 4\' 11"  (1.499 m), weight 68 kg, SpO2 100 %.  Weight trends: Filed Weights   10/27/19 1934  Weight: 68 kg    Physical Exam: General: Alert and orientated x4 showing no signs of distress   Head: Normocephalic, atraumatic. Moist oral mucosal membranes  Eyes:  Anicteric, PERRL  Neck: Supple, trachea midline  Lungs:  Clear to auscultation  Heart: Regular rate and rhythm  Abdomen:  Soft, nontender,   Extremities:  no peripheral edema noted.  Neurologic: Nonfocal, moving  all four extremities  Skin: No lesions  Access:      Lab results: Basic Metabolic Panel: Recent Labs  Lab 10/27/19 1939 10/28/19 0455  NA 138 139  K 5.1 5.1  CL 109 113*  CO2 20* 20*  GLUCOSE 125* 106*  BUN 46* 43*  CREATININE 3.14* 2.73*  CALCIUM 10.1 9.5    Liver Function Tests: Recent Labs  Lab 10/27/19 2218  AST 13*  ALT 8  ALKPHOS 57  BILITOT 0.5  PROT 7.1  ALBUMIN 3.9   Recent Labs  Lab 10/27/19 2218  LIPASE 34   No results for input(s): AMMONIA in the last 168 hours.  CBC: Recent Labs  Lab 10/27/19 2010 10/28/19 0455  WBC 8.6 8.9  HGB 9.2* 8.8*  HCT 30.2* 28.8*  MCV 91.5 92.6  PLT 275 271    Cardiac Enzymes: No results for input(s): CKTOTAL, CKMB, CKMBINDEX, TROPONINI in the last 168 hours.  BNP: Invalid input(s): POCBNP  CBG: No results for input(s): GLUCAP in the last 168 hours.  Microbiology: Results for orders placed or performed during the hospital encounter of 10/27/19  Respiratory Panel by RT PCR (Flu A&B, Covid) - Nasopharyngeal Swab     Status: None   Collection Time: 10/28/19  2:41 AM   Specimen: Nasopharyngeal Swab  Result Value Ref Range Status   SARS Coronavirus 2 by RT PCR NEGATIVE NEGATIVE Final    Comment: (NOTE) SARS-CoV-2 target nucleic acids are NOT DETECTED. The SARS-CoV-2 RNA is generally detectable in upper respiratoy specimens during the acute phase of infection. The lowest concentration of SARS-CoV-2 viral copies this assay can detect is 131 copies/mL. A negative result does not preclude SARS-Cov-2 infection and should not be used as the sole basis for treatment or other patient management decisions. A negative result may occur with  improper specimen collection/handling, submission of specimen  other than nasopharyngeal swab, presence of viral mutation(s) within the areas targeted by this assay, and inadequate number of viral copies (<131 copies/mL). A negative result must be combined with clinical observations, patient history, and epidemiological information. The expected result is Negative. Fact Sheet for Patients:  PinkCheek.be Fact Sheet for Healthcare Providers:  GravelBags.it This test is not yet ap proved or cleared by the [place] FDA and  has been authorized for detection and/or diagnosis of SARS-CoV-2 by FDA under an Emergency Use Authorization (EUA). This EUA will remain  in effect (meaning this test can be used) for the duration of the COVID-19 declaration under Section 564(b)(1) of the Act, 21 U.S.C. section 360bbb-3(b)(1), unless the authorization is terminated or revoked sooner.    Influenza A by PCR NEGATIVE NEGATIVE Final   Influenza B by PCR NEGATIVE NEGATIVE Final    Comment: (NOTE) The Xpert Xpress SARS-CoV-2/FLU/RSV assay is intended as an aid in  the diagnosis of influenza from Nasopharyngeal swab specimens and  should not be used as a sole basis for treatment. Nasal washings and  aspirates are unacceptable for Xpert Xpress SARS-CoV-2/FLU/RSV  testing. Fact Sheet for Patients: PinkCheek.be Fact Sheet for Healthcare Providers: GravelBags.it This test is not yet approved or cleared by the [place] FDA and  has been authorized for detection and/or diagnosis of SARS-CoV-2 by  FDA under an Emergency Use Authorization (EUA). This EUA will remain  in effect (meaning this test can be used) for the duration of the  Covid-19 declaration under Section 564(b)(1) of the Act, 21  U.S.C. section 360bbb-3(b)(1), unless the authorization is  terminated or revoked. Performed at  [address] [place], [place]  [id number]     Coagulation Studies: No results for input(s): LABPROT, INR in the last 72 hours.  Urinalysis: Recent Labs    10/27/19 2347  COLORURINE STRAW*  LABSPEC 1.010  PHURINE 6.0  GLUCOSEU NEGATIVE  HGBUR NEGATIVE  BILIRUBINUR NEGATIVE  KETONESUR NEGATIVE  PROTEINUR 100*  NITRITE NEGATIVE  LEUKOCYTESUR NEGATIVE      Imaging: CT ABDOMEN PELVIS WO CONTRAST  Addendum Date: 10/28/2019   ADDENDUM REPORT: 10/28/2019 02:43 ADDENDUM: Critical Value/emergent results were called by telephone at the time of interpretation on 10/28/2019 at 0230 hours to Dr. Rudene Re , who verbally acknowledged these results. Electronically Signed   By: Genevie Ann M.D.   On: 10/28/2019 02:43   Result Date: 10/28/2019 CLINICAL DATA:  64 year old female with known thoracoabdominal aortic aneurysm and chest pain radiating to the back. Creatinine greater than 3 such that IV contrast is deferred at this time. EXAM: CT CHEST, ABDOMEN AND PELVIS WITHOUT CONTRAST TECHNIQUE: Multidetector CT imaging of the chest, abdomen and pelvis was performed following the standard protocol without IV contrast. COMPARISON:  Prior noncontrast chest and abdominal/pelvic CTs 05/21/2018 and earlier. No prior contrast enhanced CTs in our system since 1997. FINDINGS: CT CHEST FINDINGS Cardiovascular: Chronic fusiform enlargement of the thoracic aorta, caliber in the arch appears increased since 2019 from approximately 34 millimeters at that time to 40 millimeters now. Furthermore, with narrow CT windows a dissection flap is visible in the arch and descending aorta (series 2, image 23). Even with narrow windows no flap is evident in the ascending aorta or proximal to the great vessel origins. Aneurysmal caliber of the descending thoracic aorta appears not significantly changed from 2019 up to 53 millimeters (series 2, image 35). Stable mild cardiomegaly with no pericardial effusion. Calcified coronary artery atherosclerosis. No periaortic  hematoma is evident. Mediastinum/Nodes: Negative.  No lymphadenopathy. Lungs/Pleura: Major airways are patent. There is increased left lower lobe pulmonary opacity since 2018 that most resembles atelectasis. No definite pleural effusion. No other acute pulmonary opacity. Musculoskeletal: No acute osseous abnormality identified. CT ABDOMEN PELVIS FINDINGS Hepatobiliary: Negative noncontrast liver and gallbladder. Pancreas: Negative. Spleen: Negative. Adrenals/Urinary Tract: Normal adrenal glands. Chronic renal cysts appear stable since 2019. No hydronephrosis. Mildly distended but otherwise unremarkable urinary bladder. Stomach/Bowel: No dilated large or small bowel in the abdomen. Mild motion artifact in the lower abdomen. No free air or free fluid. Vascular/Lymphatic: The dissection flap visible in the descending thoracic aorta on these noncontrast images cannot be delineated in the abdomen. Fusiform aneurysmal enlargement of the aorta at the level of the diaphragm has mildly increased since 2019 from 52-54 millimeters. Ectasia of the remaining abdominal aorta appears stable. Superimposed Aortoiliac calcified atherosclerosis. No lymphadenopathy. Reproductive: Negative noncontrast appearance. Other: No pelvic free fluid. Musculoskeletal: No acute osseous abnormality identified. IMPRESSION: 1. This study is positive for Thoracic Aortic Dissection despite the absence of IV contrast. A new dissection flap since 2019 is evident in the arch and the descending thoracic aorta. No flap is evident in the ascending aorta, favoring a Stanford type B dissection. The distal extent of the flap is unclear, but the abdominal aorta might be spared. 2. Subsequent increased fusiform aneurysmal enlargement of the arch since 2019 from 34 mm then to 40 mm now. But the larger descending and thoracoabdominal aortic aneurysmal segments appear stable since 2019 approximately 52-54 mm diameter. 3. No evidence of aortic rupture on this  non-contrast exam. 4. Recommend Vascular Surgery Consultation  to determine the next best diagnostic and treatment steps. Electronically Signed: By: Genevie Ann M.D. On: 10/28/2019 02:25   DG Chest 2 View  Result Date: 10/27/2019 CLINICAL DATA:  Chest pain EXAM: CHEST - 2 VIEW COMPARISON:  CT 11/18/2014 FINDINGS: Heart is enlarged. Tortuous, diffusely aneurysmal thoracic aorta. Lungs clear. No effusions. No acute bony abnormality. IMPRESSION: No acute cardiopulmonary disease. Cardiomegaly, diffusely aneurysmal thoracic aorta. This is similar to prior CT. Electronically Signed   By: Rolm Baptise M.D.   On: 10/27/2019 20:04   CT Chest Wo Contrast  Addendum Date: 10/28/2019   ADDENDUM REPORT: 10/28/2019 02:43 ADDENDUM: Critical Value/emergent results were called by telephone at the time of interpretation on 10/28/2019 at 0230 hours to Dr. Rudene Re , who verbally acknowledged these results. Electronically Signed   By: Genevie Ann M.D.   On: 10/28/2019 02:43   Result Date: 10/28/2019 CLINICAL DATA:  64 year old female with known thoracoabdominal aortic aneurysm and chest pain radiating to the back. Creatinine greater than 3 such that IV contrast is deferred at this time. EXAM: CT CHEST, ABDOMEN AND PELVIS WITHOUT CONTRAST TECHNIQUE: Multidetector CT imaging of the chest, abdomen and pelvis was performed following the standard protocol without IV contrast. COMPARISON:  Prior noncontrast chest and abdominal/pelvic CTs 05/21/2018 and earlier. No prior contrast enhanced CTs in our system since 1997. FINDINGS: CT CHEST FINDINGS Cardiovascular: Chronic fusiform enlargement of the thoracic aorta, caliber in the arch appears increased since 2019 from approximately 34 millimeters at that time to 40 millimeters now. Furthermore, with narrow CT windows a dissection flap is visible in the arch and descending aorta (series 2, image 23). Even with narrow windows no flap is evident in the ascending aorta or proximal to the  great vessel origins. Aneurysmal caliber of the descending thoracic aorta appears not significantly changed from 2019 up to 53 millimeters (series 2, image 35). Stable mild cardiomegaly with no pericardial effusion. Calcified coronary artery atherosclerosis. No periaortic hematoma is evident. Mediastinum/Nodes: Negative.  No lymphadenopathy. Lungs/Pleura: Major airways are patent. There is increased left lower lobe pulmonary opacity since 2018 that most resembles atelectasis. No definite pleural effusion. No other acute pulmonary opacity. Musculoskeletal: No acute osseous abnormality identified. CT ABDOMEN PELVIS FINDINGS Hepatobiliary: Negative noncontrast liver and gallbladder. Pancreas: Negative. Spleen: Negative. Adrenals/Urinary Tract: Normal adrenal glands. Chronic renal cysts appear stable since 2019. No hydronephrosis. Mildly distended but otherwise unremarkable urinary bladder. Stomach/Bowel: No dilated large or small bowel in the abdomen. Mild motion artifact in the lower abdomen. No free air or free fluid. Vascular/Lymphatic: The dissection flap visible in the descending thoracic aorta on these noncontrast images cannot be delineated in the abdomen. Fusiform aneurysmal enlargement of the aorta at the level of the diaphragm has mildly increased since 2019 from 52-54 millimeters. Ectasia of the remaining abdominal aorta appears stable. Superimposed Aortoiliac calcified atherosclerosis. No lymphadenopathy. Reproductive: Negative noncontrast appearance. Other: No pelvic free fluid. Musculoskeletal: No acute osseous abnormality identified. IMPRESSION: 1. This study is positive for Thoracic Aortic Dissection despite the absence of IV contrast. A new dissection flap since 2019 is evident in the arch and the descending thoracic aorta. No flap is evident in the ascending aorta, favoring a Stanford type B dissection. The distal extent of the flap is unclear, but the abdominal aorta might be spared. 2. Subsequent  increased fusiform aneurysmal enlargement of the arch since 2019 from 34 mm then to 40 mm now. But the larger descending and thoracoabdominal aortic aneurysmal segments appear stable since 2019 approximately  52-54 mm diameter. 3. No evidence of aortic rupture on this non-contrast exam. 4. Recommend Vascular Surgery Consultation to determine the next best diagnostic and treatment steps. Electronically Signed: By: Genevie Ann M.D. On: 10/28/2019 02:25   CT Angio Chest/Abd/Pel for Dissection W and/or Wo Contrast  Addendum Date: 10/28/2019   ADDENDUM REPORT: 10/28/2019 03:50 ADDENDUM: Study discussed by telephone with Dr. Rudene Re on 10/28/2019 at [place]. Electronically Signed   By: Genevie Ann M.D.   On: 10/28/2019 03:50   Result Date: 10/28/2019 CLINICAL DATA:  64 year old female with history of chronic kidney disease and chronic thoracoabdominal aneurysm. Acute pain radiating to the back, and evidence of new dissection since 2019 in the thoracic aorta on noncontrast exam tonight. EXAM: CT ANGIOGRAPHY CHEST, ABDOMEN AND PELVIS TECHNIQUE: Multidetector CT imaging through the chest, abdomen and pelvis was performed using the standard protocol during bolus administration of intravenous contrast. Multiplanar reconstructed images and MIPs were obtained and reviewed to evaluate the vascular anatomy. CONTRAST:  117mL OMNIPAQUE IOHEXOL 350 MG/ML SOLN COMPARISON:  Noncontrast CT Chest, Abdomen, and Pelvis earlier tonight. FINDINGS: CTA CHEST FINDINGS Cardiovascular: Good contrast timing in the aorta. These images confirm a Stanford type B aortic dissection which begins immediately posterior to the left subclavian artery origin on series 4, image 21. The dissection flap continues through the descending aorta and thoracoabdominal aortic aneurysm into the proximal abdominal aorta as detailed below. No periaortic hematoma. Stable cardiac size. No pericardial effusion. There is a trace left pleural effusion evident with  the addition of IV contrast, but stable. Proximal great vessels appear patent without dissection. Central pulmonary arteries also appear patent. Otherwise stable CT appearance of the chest from 0159 hours today. Review of the MIP images confirms the above findings. CTA ABDOMEN AND PELVIS FINDINGS VASCULAR The dissection continues into the upper abdominal aorta and the true lumen is in the midline with late or nonenhancing false lumen along both lateral walls (series 4, image 88). The celiac arises from the true lumen. The SMA arises from the true lumen. The dissection then terminates near the left main renal artery origin (series 4, images 96-98). Both main renal arteries are enhancing. The infrarenal and iliofemoral arteries are patent and stable since 2019 with calcified atherosclerosis. No periaortic hematoma. Review of the MIP images confirms the above findings. NON-VASCULAR Satisfactory appearance of liver, spleen, pancreas and adrenal gland enhancement. Symmetric appearing renal enhancement with superimposed chronic renal cysts. Otherwise stable CT appearance of the abdomen and pelvis from 0159 hours today. Review of the MIP images confirms the above findings. IMPRESSION: 1. Confirmed Stanford type B Aortic Dissection which begins just distal to the left subclavian artery origin, continues into the proximal abdominal aorta, and terminates at the origin of the left renal artery. 2. Celiac, SMA, and main renal arteries arise from the true lumen. No periaortic hematoma. Underlying thoracoabdominal aortic aneurysm as detailed earlier tonight. 3. Otherwise stable CT appearance of the chest, abdomen, and pelvis from the 0159 hour study today; there is a trace left pleural effusion. Electronically Signed: By: Genevie Ann M.D. On: 10/28/2019 03:28   US Abdomen Limited RUQ  Result Date: 10/28/2019 CLINICAL DATA:  Epigastric pain EXAM: ULTRASOUND ABDOMEN LIMITED RIGHT UPPER QUADRANT COMPARISON:  CT May 21, 2018  FINDINGS: Gallbladder: No gallstones or wall thickening visualized. No sonographic Murphy sign noted by sonographer. Common bile duct: Diameter: 2.8 mm Liver: No focal lesion identified. Within normal limits in parenchymal echogenicity. Portal vein is patent on color Doppler  imaging with normal direction of blood flow towards the liver. Other: There is incidental note made of a proximal aortic aneurysm measuring up to 4.8 cm, with internal echogenicity which could be calcified or noncalcified atherosclerosis. IMPRESSION: 1. Normal liver and gallbladder. 2. Proximal aortic aneurysm measuring 4.8 cm, grossly unchanged given differences in technique. Recommend followup by abdomen and pelvis CTA in 6 months. This recommendation follows ACR consensus guidelines: White Paper of the ACR Incidental Findings Committee II on Vascular Findings. J Am Coll Radiol 2013; 10:789-794. Aortic aneurysm NOS (ICD10-I71.9) Electronically Signed   By: Prudencio Pair M.D.   On: 10/28/2019 01:03      Assessment & Plan: Elizabeth Roy is a 64 y.o.  female with significant pass medical history of chronic kidney disease stage 3, HTN, AAA, iron deficiency anaemia,  arthritis  who was admitted to [facility] on 10/27/2019 for type B thoracic aortic dissection requiring esmolol and nicardipine infusions for strict heart rate and blood pressure control.   1. HTN-  Maintain blood pressure below [id number] systolic  -Continue Nicardpine and esmolol  Infusions   2. AKI - continue to monitored creatine 2.73 (baseline 2.52 11/2017) -continue to monitor repeat BMP tomorrow trending creatine  -continue LR   -ordered cardio echo to determine cardiac out   3. thoracoabdominal aneurysm- Stanford type B dissection  Vascular recommend medical management with emphasis on HTN control. 120 SBP or less and referral back to Dr. Sammuel Hines - with new dissection will most likely need repair  4. Anemia- stable non systematic, normocytic anemia -order a iron study   -start patient on supplemental iron        LOS: 0 Marcello Fennel 1/13/20213:45 PM

## 2019-10-29 DIAGNOSIS — I7103 Dissection of thoracoabdominal aorta: Secondary | ICD-10-CM

## 2019-10-29 DIAGNOSIS — N184 Chronic kidney disease, stage 4 (severe): Secondary | ICD-10-CM

## 2019-10-29 LAB — BASIC METABOLIC PANEL
Anion gap: 7 (ref 5–15)
BUN: 34 mg/dL — ABNORMAL HIGH (ref 8–23)
CO2: 19 mmol/L — ABNORMAL LOW (ref 22–32)
Calcium: 9.8 mg/dL (ref 8.9–10.3)
Chloride: 116 mmol/L — ABNORMAL HIGH (ref 98–111)
Creatinine, Ser: 2.88 mg/dL — ABNORMAL HIGH (ref 0.44–1.00)
GFR calc Af Amer: 19 mL/min — ABNORMAL LOW (ref >60)
GFR calc non Af Amer: 16 mL/min — ABNORMAL LOW (ref >60)
Glucose, Bld: 77 mg/dL (ref 70–99)
Potassium: 4.8 mmol/L (ref 3.5–5.1)
Sodium: 142 mmol/L (ref 135–145)

## 2019-10-29 LAB — CBC
HCT: 30.4 % — ABNORMAL LOW (ref 36.0–46.0)
Hemoglobin: 9.2 g/dL — ABNORMAL LOW (ref 12.0–15.0)
MCH: 27.7 pg (ref 26.0–34.0)
MCHC: 30.3 g/dL (ref 30.0–36.0)
MCV: 91.6 fL (ref 80.0–100.0)
Platelets: 268 10*3/uL (ref 150–400)
RBC: 3.32 MIL/uL — ABNORMAL LOW (ref 3.87–5.11)
RDW: 13.7 % (ref 11.5–15.5)
WBC: 6.9 10*3/uL (ref 4.0–10.5)
nRBC: 0 % (ref 0.0–0.2)

## 2019-10-29 MED ORDER — BISOPROLOL FUMARATE 5 MG PO TABS
10.0000 mg | ORAL_TABLET | Freq: Every day | ORAL | Status: DC
  Administered 2019-10-29 – 2019-11-01 (×4): 10 mg via ORAL
  Filled 2019-10-29 (×4): qty 2

## 2019-10-29 MED ORDER — HYDRALAZINE HCL 20 MG/ML IJ SOLN
10.0000 mg | INTRAMUSCULAR | Status: DC | PRN
  Administered 2019-10-29 – 2019-10-30 (×2): 20 mg via INTRAVENOUS
  Filled 2019-10-29 (×2): qty 1

## 2019-10-29 NOTE — Progress Notes (Signed)
[facility]  ROUNDING NOTE   Subjective:   Weaned off nicardipine and esmolol.   Objective:  Vital signs in last 24 hours:  Temp:  [98.4 F (36.9 C)-99.2 F (37.3 C)] 99.2 F (37.3 C) (01/14 0400) Pulse Rate:  [63-97] 71 (01/14 1100) Resp:  [16-26] 20 (01/14 1100) BP: (108-143)/(63-90) 141/90 (01/14 1100) SpO2:  [93 %-100 %] 98 % (01/14 1100) Weight:  [66.9 kg] 66.9 kg (01/14 0500)  Weight change: -1.14 kg Filed Weights   10/27/19 1934 10/29/19 0500  Weight: 68 kg 66.9 kg    Intake/Output: I/O last 3 completed shifts: In: 2727.3 [I.V.:2227.3; IV Piggyback:500] Out: 550 [Urine:550]   Intake/Output this shift:  No intake/output data recorded.  Physical Exam: General: NAD,   Head: Normocephalic, atraumatic. Moist oral mucosal membranes  Eyes: Anicteric, PERRL  Neck: Supple, trachea midline  Lungs:  Clear to auscultation  Heart: Regular rate and rhythm  Abdomen:  Soft, nontender,   Extremities:  no peripheral edema.  Neurologic: Nonfocal, moving all four extremities  Skin: No lesions        Basic Metabolic Panel: Recent Labs  Lab 10/27/19 1939 10/28/19 0455 10/29/19 0551  NA 138 139 142  K 5.1 5.1 4.8  CL 109 113* 116*  CO2 20* 20* 19*  GLUCOSE 125* 106* 77  BUN 46* 43* 34*  CREATININE 3.14* 2.73* 2.88*  CALCIUM 10.1 9.5 9.8    Liver Function Tests: Recent Labs  Lab 10/27/19 2218  AST 13*  ALT 8  ALKPHOS 57  BILITOT 0.5  PROT 7.1  ALBUMIN 3.9   Recent Labs  Lab 10/27/19 2218  LIPASE 34   No results for input(s): AMMONIA in the last 168 hours.  CBC: Recent Labs  Lab 10/27/19 2010 10/28/19 0455 10/29/19 0551  WBC 8.6 8.9 6.9  HGB 9.2* 8.8* 9.2*  HCT 30.2* 28.8* 30.4*  MCV 91.5 92.6 91.6  PLT 275 271 268    Cardiac Enzymes: No results for input(s): CKTOTAL, CKMB, CKMBINDEX, TROPONINI in the last 168 hours.  BNP: Invalid input(s): POCBNP  CBG: Recent Labs  Lab 10/28/19 1939  GLUCAP 95     Microbiology: Results for orders placed or performed during the hospital encounter of 10/27/19  Respiratory Panel by RT PCR (Flu A&B, Covid) - Nasopharyngeal Swab     Status: None   Collection Time: 10/28/19  2:41 AM   Specimen: Nasopharyngeal Swab  Result Value Ref Range Status   SARS Coronavirus 2 by RT PCR NEGATIVE NEGATIVE Final    Comment: (NOTE) SARS-CoV-2 target nucleic acids are NOT DETECTED. The SARS-CoV-2 RNA is generally detectable in upper respiratoy specimens during the acute phase of infection. The lowest concentration of SARS-CoV-2 viral copies this assay can detect is 131 copies/mL. A negative result does not preclude SARS-Cov-2 infection and should not be used as the sole basis for treatment or other patient management decisions. A negative result may occur with  improper specimen collection/handling, submission of specimen other than nasopharyngeal swab, presence of viral mutation(s) within the areas targeted by this assay, and inadequate number of viral copies (<131 copies/mL). A negative result must be combined with clinical observations, patient history, and epidemiological information. The expected result is Negative. Fact Sheet for Patients:  PinkCheek.be Fact Sheet for Healthcare Providers:  GravelBags.it This test is not yet ap proved or cleared by the [place] FDA and  has been authorized for detection and/or diagnosis of SARS-CoV-2 by FDA under an Emergency Use Authorization (EUA). This EUA will  remain  in effect (meaning this test can be used) for the duration of the COVID-19 declaration under Section 564(b)(1) of the Act, 21 U.S.C. section 360bbb-3(b)(1), unless the authorization is terminated or revoked sooner.    Influenza A by PCR NEGATIVE NEGATIVE Final   Influenza B by PCR NEGATIVE NEGATIVE Final    Comment: (NOTE) The Xpert Xpress SARS-CoV-2/FLU/RSV assay is intended as an aid in   the diagnosis of influenza from Nasopharyngeal swab specimens and  should not be used as a sole basis for treatment. Nasal washings and  aspirates are unacceptable for Xpert Xpress SARS-CoV-2/FLU/RSV  testing. Fact Sheet for Patients: PinkCheek.be Fact Sheet for Healthcare Providers: GravelBags.it This test is not yet approved or cleared by the [place] FDA and  has been authorized for detection and/or diagnosis of SARS-CoV-2 by  FDA under an Emergency Use Authorization (EUA). This EUA will remain  in effect (meaning this test can be used) for the duration of the  Covid-19 declaration under Section 564(b)(1) of the Act, 21  U.S.C. section 360bbb-3(b)(1), unless the authorization is  terminated or revoked. Performed at [facility], [address] [place], [place] [address]   MRSA PCR Screening     Status: None   Collection Time: 10/28/19  1:37 PM   Specimen: Nasal Mucosa; Nasopharyngeal  Result Value Ref Range Status   MRSA by PCR NEGATIVE NEGATIVE Final    Comment:        The GeneXpert MRSA Assay (FDA approved for NASAL specimens only), is one component of a comprehensive MRSA colonization surveillance program. It is not intended to diagnose MRSA infection nor to guide or monitor treatment for MRSA infections. Performed at [facility], [address] [place], [place] [address]     Coagulation Studies: No results for input(s): LABPROT, INR in the last 72 hours.  Urinalysis: Recent Labs    10/27/19 2347  COLORURINE STRAW*  LABSPEC 1.010  PHURINE 6.0  GLUCOSEU NEGATIVE  HGBUR NEGATIVE  BILIRUBINUR NEGATIVE  KETONESUR NEGATIVE  PROTEINUR 100*  NITRITE NEGATIVE  LEUKOCYTESUR NEGATIVE      Imaging: CT ABDOMEN PELVIS WO CONTRAST  Addendum Date: 10/28/2019   ADDENDUM REPORT: 10/28/2019 02:43 ADDENDUM: Critical Value/emergent results were called by telephone at the time of  interpretation on 10/28/2019 at 0230 hours to Dr. Rudene Re , who verbally acknowledged these results. Electronically Signed   By: Genevie Ann M.D.   On: 10/28/2019 02:43   Result Date: 10/28/2019 CLINICAL DATA:  64 year old female with known thoracoabdominal aortic aneurysm and chest pain radiating to the back. Creatinine greater than 3 such that IV contrast is deferred at this time. EXAM: CT CHEST, ABDOMEN AND PELVIS WITHOUT CONTRAST TECHNIQUE: Multidetector CT imaging of the chest, abdomen and pelvis was performed following the standard protocol without IV contrast. COMPARISON:  Prior noncontrast chest and abdominal/pelvic CTs 05/21/2018 and earlier. No prior contrast enhanced CTs in our system since 1997. FINDINGS: CT CHEST FINDINGS Cardiovascular: Chronic fusiform enlargement of the thoracic aorta, caliber in the arch appears increased since 2019 from approximately 34 millimeters at that time to 40 millimeters now. Furthermore, with narrow CT windows a dissection flap is visible in the arch and descending aorta (series 2, image 23). Even with narrow windows no flap is evident in the ascending aorta or proximal to the great vessel origins. Aneurysmal caliber of the descending thoracic aorta appears not significantly changed from 2019 up to 53 millimeters (series 2, image 35). Stable mild cardiomegaly with no pericardial  effusion. Calcified coronary artery atherosclerosis. No periaortic hematoma is evident. Mediastinum/Nodes: Negative.  No lymphadenopathy. Lungs/Pleura: Major airways are patent. There is increased left lower lobe pulmonary opacity since 2018 that most resembles atelectasis. No definite pleural effusion. No other acute pulmonary opacity. Musculoskeletal: No acute osseous abnormality identified. CT ABDOMEN PELVIS FINDINGS Hepatobiliary: Negative noncontrast liver and gallbladder. Pancreas: Negative. Spleen: Negative. Adrenals/Urinary Tract: Normal adrenal glands. Chronic renal cysts appear  stable since 2019. No hydronephrosis. Mildly distended but otherwise unremarkable urinary bladder. Stomach/Bowel: No dilated large or small bowel in the abdomen. Mild motion artifact in the lower abdomen. No free air or free fluid. Vascular/Lymphatic: The dissection flap visible in the descending thoracic aorta on these noncontrast images cannot be delineated in the abdomen. Fusiform aneurysmal enlargement of the aorta at the level of the diaphragm has mildly increased since 2019 from 52-54 millimeters. Ectasia of the remaining abdominal aorta appears stable. Superimposed Aortoiliac calcified atherosclerosis. No lymphadenopathy. Reproductive: Negative noncontrast appearance. Other: No pelvic free fluid. Musculoskeletal: No acute osseous abnormality identified. IMPRESSION: 1. This study is positive for Thoracic Aortic Dissection despite the absence of IV contrast. A new dissection flap since 2019 is evident in the arch and the descending thoracic aorta. No flap is evident in the ascending aorta, favoring a Stanford type B dissection. The distal extent of the flap is unclear, but the abdominal aorta might be spared. 2. Subsequent increased fusiform aneurysmal enlargement of the arch since 2019 from 34 mm then to 40 mm now. But the larger descending and thoracoabdominal aortic aneurysmal segments appear stable since 2019 approximately 52-54 mm diameter. 3. No evidence of aortic rupture on this non-contrast exam. 4. Recommend Vascular Surgery Consultation to determine the next best diagnostic and treatment steps. Electronically Signed: By: Genevie Ann M.D. On: 10/28/2019 02:25   DG Chest 2 View  Result Date: 10/27/2019 CLINICAL DATA:  Chest pain EXAM: CHEST - 2 VIEW COMPARISON:  CT 11/18/2014 FINDINGS: Heart is enlarged. Tortuous, diffusely aneurysmal thoracic aorta. Lungs clear. No effusions. No acute bony abnormality. IMPRESSION: No acute cardiopulmonary disease. Cardiomegaly, diffusely aneurysmal thoracic aorta. This  is similar to prior CT. Electronically Signed   By: Rolm Baptise M.D.   On: 10/27/2019 20:04   CT Chest Wo Contrast  Addendum Date: 10/28/2019   ADDENDUM REPORT: 10/28/2019 02:43 ADDENDUM: Critical Value/emergent results were called by telephone at the time of interpretation on 10/28/2019 at 0230 hours to Dr. Rudene Re , who verbally acknowledged these results. Electronically Signed   By: Genevie Ann M.D.   On: 10/28/2019 02:43   Result Date: 10/28/2019 CLINICAL DATA:  64 year old female with known thoracoabdominal aortic aneurysm and chest pain radiating to the back. Creatinine greater than 3 such that IV contrast is deferred at this time. EXAM: CT CHEST, ABDOMEN AND PELVIS WITHOUT CONTRAST TECHNIQUE: Multidetector CT imaging of the chest, abdomen and pelvis was performed following the standard protocol without IV contrast. COMPARISON:  Prior noncontrast chest and abdominal/pelvic CTs 05/21/2018 and earlier. No prior contrast enhanced CTs in our system since 1997. FINDINGS: CT CHEST FINDINGS Cardiovascular: Chronic fusiform enlargement of the thoracic aorta, caliber in the arch appears increased since 2019 from approximately 34 millimeters at that time to 40 millimeters now. Furthermore, with narrow CT windows a dissection flap is visible in the arch and descending aorta (series 2, image 23). Even with narrow windows no flap is evident in the ascending aorta or proximal to the great vessel origins. Aneurysmal caliber of the descending thoracic aorta appears not  significantly changed from 2019 up to 53 millimeters (series 2, image 35). Stable mild cardiomegaly with no pericardial effusion. Calcified coronary artery atherosclerosis. No periaortic hematoma is evident. Mediastinum/Nodes: Negative.  No lymphadenopathy. Lungs/Pleura: Major airways are patent. There is increased left lower lobe pulmonary opacity since 2018 that most resembles atelectasis. No definite pleural effusion. No other acute pulmonary  opacity. Musculoskeletal: No acute osseous abnormality identified. CT ABDOMEN PELVIS FINDINGS Hepatobiliary: Negative noncontrast liver and gallbladder. Pancreas: Negative. Spleen: Negative. Adrenals/Urinary Tract: Normal adrenal glands. Chronic renal cysts appear stable since 2019. No hydronephrosis. Mildly distended but otherwise unremarkable urinary bladder. Stomach/Bowel: No dilated large or small bowel in the abdomen. Mild motion artifact in the lower abdomen. No free air or free fluid. Vascular/Lymphatic: The dissection flap visible in the descending thoracic aorta on these noncontrast images cannot be delineated in the abdomen. Fusiform aneurysmal enlargement of the aorta at the level of the diaphragm has mildly increased since 2019 from 52-54 millimeters. Ectasia of the remaining abdominal aorta appears stable. Superimposed Aortoiliac calcified atherosclerosis. No lymphadenopathy. Reproductive: Negative noncontrast appearance. Other: No pelvic free fluid. Musculoskeletal: No acute osseous abnormality identified. IMPRESSION: 1. This study is positive for Thoracic Aortic Dissection despite the absence of IV contrast. A new dissection flap since 2019 is evident in the arch and the descending thoracic aorta. No flap is evident in the ascending aorta, favoring a Stanford type B dissection. The distal extent of the flap is unclear, but the abdominal aorta might be spared. 2. Subsequent increased fusiform aneurysmal enlargement of the arch since 2019 from 34 mm then to 40 mm now. But the larger descending and thoracoabdominal aortic aneurysmal segments appear stable since 2019 approximately 52-54 mm diameter. 3. No evidence of aortic rupture on this non-contrast exam. 4. Recommend Vascular Surgery Consultation to determine the next best diagnostic and treatment steps. Electronically Signed: By: Genevie Ann M.D. On: 10/28/2019 02:25   CT Angio Chest/Abd/Pel for Dissection W and/or Wo Contrast  Addendum Date:  10/28/2019   ADDENDUM REPORT: 10/28/2019 03:50 ADDENDUM: Study discussed by telephone with Dr. Rudene Re on 10/28/2019 at [place]. Electronically Signed   By: Genevie Ann M.D.   On: 10/28/2019 03:50   Result Date: 10/28/2019 CLINICAL DATA:  64 year old female with history of chronic kidney disease and chronic thoracoabdominal aneurysm. Acute pain radiating to the back, and evidence of new dissection since 2019 in the thoracic aorta on noncontrast exam tonight. EXAM: CT ANGIOGRAPHY CHEST, ABDOMEN AND PELVIS TECHNIQUE: Multidetector CT imaging through the chest, abdomen and pelvis was performed using the standard protocol during bolus administration of intravenous contrast. Multiplanar reconstructed images and MIPs were obtained and reviewed to evaluate the vascular anatomy. CONTRAST:  130mL OMNIPAQUE IOHEXOL 350 MG/ML SOLN COMPARISON:  Noncontrast CT Chest, Abdomen, and Pelvis earlier tonight. FINDINGS: CTA CHEST FINDINGS Cardiovascular: Good contrast timing in the aorta. These images confirm a Stanford type B aortic dissection which begins immediately posterior to the left subclavian artery origin on series 4, image 21. The dissection flap continues through the descending aorta and thoracoabdominal aortic aneurysm into the proximal abdominal aorta as detailed below. No periaortic hematoma. Stable cardiac size. No pericardial effusion. There is a trace left pleural effusion evident with the addition of IV contrast, but stable. Proximal great vessels appear patent without dissection. Central pulmonary arteries also appear patent. Otherwise stable CT appearance of the chest from 0159 hours today. Review of the MIP images confirms the above findings. CTA ABDOMEN AND PELVIS FINDINGS VASCULAR The dissection continues  into the upper abdominal aorta and the true lumen is in the midline with late or nonenhancing false lumen along both lateral walls (series 4, image 88). The celiac arises from the true lumen. The SMA  arises from the true lumen. The dissection then terminates near the left main renal artery origin (series 4, images 96-98). Both main renal arteries are enhancing. The infrarenal and iliofemoral arteries are patent and stable since 2019 with calcified atherosclerosis. No periaortic hematoma. Review of the MIP images confirms the above findings. NON-VASCULAR Satisfactory appearance of liver, spleen, pancreas and adrenal gland enhancement. Symmetric appearing renal enhancement with superimposed chronic renal cysts. Otherwise stable CT appearance of the abdomen and pelvis from 0159 hours today. Review of the MIP images confirms the above findings. IMPRESSION: 1. Confirmed Stanford type B Aortic Dissection which begins just distal to the left subclavian artery origin, continues into the proximal abdominal aorta, and terminates at the origin of the left renal artery. 2. Celiac, SMA, and main renal arteries arise from the true lumen. No periaortic hematoma. Underlying thoracoabdominal aortic aneurysm as detailed earlier tonight. 3. Otherwise stable CT appearance of the chest, abdomen, and pelvis from the 0159 hour study today; there is a trace left pleural effusion. Electronically Signed: By: Genevie Ann M.D. On: 10/28/2019 03:28   US Abdomen Limited RUQ  Result Date: 10/28/2019 CLINICAL DATA:  Epigastric pain EXAM: ULTRASOUND ABDOMEN LIMITED RIGHT UPPER QUADRANT COMPARISON:  CT May 21, 2018 FINDINGS: Gallbladder: No gallstones or wall thickening visualized. No sonographic Murphy sign noted by sonographer. Common bile duct: Diameter: 2.8 mm Liver: No focal lesion identified. Within normal limits in parenchymal echogenicity. Portal vein is patent on color Doppler imaging with normal direction of blood flow towards the liver. Other: There is incidental note made of a proximal aortic aneurysm measuring up to 4.8 cm, with internal echogenicity which could be calcified or noncalcified atherosclerosis. IMPRESSION: 1. Normal  liver and gallbladder. 2. Proximal aortic aneurysm measuring 4.8 cm, grossly unchanged given differences in technique. Recommend followup by abdomen and pelvis CTA in 6 months. This recommendation follows ACR consensus guidelines: White Paper of the ACR Incidental Findings Committee II on Vascular Findings. J Am Coll Radiol 2013; 10:789-794. Aortic aneurysm NOS (ICD10-I71.9) Electronically Signed   By: Prudencio Pair M.D.   On: 10/28/2019 01:03     Medications:   . esmolol Stopped (10/28/19 1501)  . lactated ringers 50 mL/hr at 10/29/19 0600  . niCARDipine Stopped (10/28/19 1338)   . amLODipine  10 mg Oral Daily  . bisoprolol  10 mg Oral Daily  . Chlorhexidine Gluconate Cloth  6 each Topical Daily  . heparin injection (subcutaneous)  5,000 Units Subcutaneous Q8H  . irbesartan  300 mg Oral Daily   acetaminophen, HYDROmorphone (DILAUDID) injection, ondansetron (ZOFRAN) IV, oxyCODONE-acetaminophen  Assessment/ Plan:  Ms. Elizabeth Roy is a 64 y.o. black female hypertension osteoarthritis who is admitted to [facility] on 10/27/2019 for Aortic dissection ([place]) [I71.00] Epigastric pain [R10.13] Dissection of thoracic aorta ([place]) [I71.01]  1. Acute renal failure on chronic kidney disease stage IV with proteinuria: baseline creatinine of 2.88, GFR of 19 on 04/02/2019.  Chronic kidney disease secondary to hypertension.   2. Hypertension: Keep below 120/80. Current regimen of irbesartan and amlodipine Weaned off esmolol and nicardipine gtt - start bisoprolol 10mg  daily  3. Thoracoabdominal aneurysm- Stanford type B dissection   - appreciate vascular input.   4. Anemia with chronic kidney disease: hemoglobin 9.2 stable     LOS: 1  Rosario Duey 1/14/20211:38 PM

## 2019-10-29 NOTE — Progress Notes (Addendum)
Good day. B/P  Mostly in parameters. Dr. Mortimer Fries asked for prn for increased B/P. 1855  Transferred to room 201 via wheelchair.

## 2019-10-29 NOTE — Progress Notes (Signed)
PROGRESS NOTE    Elizabeth Roy  [id number] DOB: 05-09-1956 DOA: 10/27/2019 PCP: Ronnell Freshwater, NP       Assessment & Plan:   Principal Problem:   Aortic dissection-type B Active Problems:   Abdominal aortic aneurysm without rupture (HCC)   Essential hypertension   Tobacco use disorder   Iron deficiency anemia   CKD (chronic kidney disease), stage IIIb   Normocytic anemia   Type B Aortic Dissection: hx of thoracic AAA, hypertension. D/c esmolol & nicardipine drip yesterday. Continue on amlodipine, irbesartan & start bisoprolol as per nephro. Nephro recs apprec. Vascular surg recommends medical management and referral back to Dr. Sammuel Hines w/ new dissection will most likely need repair. Pt verbalized her understanding. Vasc surg recs apprec. Dilaudid & oxycodone prn for pain   AKI on CKD IV: baseline creatinine 2.88/GFR 19. Continue on IVFs. Will continue to monitor   Normocytic anemia: no need for a transfusion at this time. Will continue to monitor     DVT prophylaxis: heparin Code Status: full  Family Communication: Disposition Plan:    Consultants:   Vascular surgery    nephro   Procedures:   Antimicrobials: n/a   Subjective: Pt c/o intermittent chest pain  Objective: Vitals:   10/29/19 0300 10/29/19 0400 10/29/19 0500 10/29/19 0600  BP: 119/72 124/65 120/75 135/80  Pulse: 75 75 97 71  Resp: (!) 24 (!) 21 19 18   Temp:  99.2 F (37.3 C)    TempSrc:  Oral    SpO2: 94% 93% 97% 95%  Weight:   66.9 kg   Height:        Intake/Output Summary (Last 24 hours) at 10/29/2019 0847 Last data filed at 10/29/2019 0600 Gross per 24 hour  Intake 2227.27 ml  Output 550 ml  Net 1677.27 ml   Filed Weights   10/27/19 1934 10/29/19 0500  Weight: 68 kg 66.9 kg    Examination:  General exam: Appears calm and comfortable  Respiratory system: Clear to auscultation. Respiratory effort normal. Cardiovascular system: S1 & S2 +. No rubs, gallops or  clicks. Gastrointestinal system: Abdomen is nondistended, soft and nontender.Normal bowel sounds heard. Central nervous system: Alert and oriented. Moves all 4 extremities Psychiatry: Judgement and insight appear normal. Flat mood and affect     Data Reviewed: I have personally reviewed following labs and imaging studies  CBC: Recent Labs  Lab 10/27/19 2010 10/28/19 0455 10/29/19 0551  WBC 8.6 8.9 6.9  HGB 9.2* 8.8* 9.2*  HCT 30.2* 28.8* 30.4*  MCV 91.5 92.6 91.6  PLT 275 271 [id number]   Basic Metabolic Panel: Recent Labs  Lab 10/27/19 1939 10/28/19 0455 10/29/19 0551  NA 138 139 142  K 5.1 5.1 4.8  CL 109 113* 116*  CO2 20* 20* 19*  GLUCOSE 125* 106* 77  BUN 46* 43* 34*  CREATININE 3.14* 2.73* 2.88*  CALCIUM 10.1 9.5 9.8   GFR: Estimated Creatinine Clearance: 16.2 mL/min (A) (by C-G formula based on SCr of 2.88 mg/dL (H)). Liver Function Tests: Recent Labs  Lab 10/27/19 2218  AST 13*  ALT 8  ALKPHOS 57  BILITOT 0.5  PROT 7.1  ALBUMIN 3.9   Recent Labs  Lab 10/27/19 2218  LIPASE 34   No results for input(s): AMMONIA in the last 168 hours. Coagulation Profile: No results for input(s): INR, PROTIME in the last 168 hours. Cardiac Enzymes: No results for input(s): CKTOTAL, CKMB, CKMBINDEX, TROPONINI in the last 168 hours. BNP (last 3 results) No results  for input(s): PROBNP in the last 8760 hours. HbA1C: No results for input(s): HGBA1C in the last 72 hours. CBG: Recent Labs  Lab 10/28/19 1939  GLUCAP 95   Lipid Profile: No results for input(s): CHOL, HDL, LDLCALC, TRIG, CHOLHDL, LDLDIRECT in the last 72 hours. Thyroid Function Tests: No results for input(s): TSH, T4TOTAL, FREET4, T3FREE, THYROIDAB in the last 72 hours. Anemia Panel: No results for input(s): VITAMINB12, FOLATE, FERRITIN, TIBC, IRON, RETICCTPCT in the last 72 hours. Sepsis Labs: No results for input(s): PROCALCITON, LATICACIDVEN in the last 168 hours.  Recent Results (from the past  240 hour(s))  Respiratory Panel by RT PCR (Flu A&B, Covid) - Nasopharyngeal Swab     Status: None   Collection Time: 10/28/19  2:41 AM   Specimen: Nasopharyngeal Swab  Result Value Ref Range Status   SARS Coronavirus 2 by RT PCR NEGATIVE NEGATIVE Final    Comment: (NOTE) SARS-CoV-2 target nucleic acids are NOT DETECTED. The SARS-CoV-2 RNA is generally detectable in upper respiratoy specimens during the acute phase of infection. The lowest concentration of SARS-CoV-2 viral copies this assay can detect is 131 copies/mL. A negative result does not preclude SARS-Cov-2 infection and should not be used as the sole basis for treatment or other patient management decisions. A negative result may occur with  improper specimen collection/handling, submission of specimen other than nasopharyngeal swab, presence of viral mutation(s) within the areas targeted by this assay, and inadequate number of viral copies (<131 copies/mL). A negative result must be combined with clinical observations, patient history, and epidemiological information. The expected result is Negative. Fact Sheet for Patients:  PinkCheek.be Fact Sheet for Healthcare Providers:  GravelBags.it This test is not yet ap proved or cleared by the [place] FDA and  has been authorized for detection and/or diagnosis of SARS-CoV-2 by FDA under an Emergency Use Authorization (EUA). This EUA will remain  in effect (meaning this test can be used) for the duration of the COVID-19 declaration under Section 564(b)(1) of the Act, 21 U.S.C. section 360bbb-3(b)(1), unless the authorization is terminated or revoked sooner.    Influenza A by PCR NEGATIVE NEGATIVE Final   Influenza B by PCR NEGATIVE NEGATIVE Final    Comment: (NOTE) The Xpert Xpress SARS-CoV-2/FLU/RSV assay is intended as an aid in  the diagnosis of influenza from Nasopharyngeal swab specimens and  should not be used  as a sole basis for treatment. Nasal washings and  aspirates are unacceptable for Xpert Xpress SARS-CoV-2/FLU/RSV  testing. Fact Sheet for Patients: PinkCheek.be Fact Sheet for Healthcare Providers: GravelBags.it This test is not yet approved or cleared by the [place] FDA and  has been authorized for detection and/or diagnosis of SARS-CoV-2 by  FDA under an Emergency Use Authorization (EUA). This EUA will remain  in effect (meaning this test can be used) for the duration of the  Covid-19 declaration under Section 564(b)(1) of the Act, 21  U.S.C. section 360bbb-3(b)(1), unless the authorization is  terminated or revoked. Performed at [facility], [address] [place], [place] [address]   MRSA PCR Screening     Status: None   Collection Time: 10/28/19  1:37 PM   Specimen: Nasal Mucosa; Nasopharyngeal  Result Value Ref Range Status   MRSA by PCR NEGATIVE NEGATIVE Final    Comment:        The GeneXpert MRSA Assay (FDA approved for NASAL specimens only), is one component of a comprehensive MRSA colonization surveillance program. It is not intended to diagnose MRSA  infection nor to guide or monitor treatment for MRSA infections. Performed at [facility], [address] [place], [place] [address]          Radiology Studies: CT ABDOMEN PELVIS WO CONTRAST  Addendum Date: 10/28/2019   ADDENDUM REPORT: 10/28/2019 02:43 ADDENDUM: Critical Value/emergent results were called by telephone at the time of interpretation on 10/28/2019 at 0230 hours to Dr. Rudene Re , who verbally acknowledged these results. Electronically Signed   By: Genevie Ann M.D.   On: 10/28/2019 02:43   Result Date: 10/28/2019 CLINICAL DATA:  64 year old female with known thoracoabdominal aortic aneurysm and chest pain radiating to the back. Creatinine greater than 3 such that IV contrast is deferred at this time. EXAM: CT  CHEST, ABDOMEN AND PELVIS WITHOUT CONTRAST TECHNIQUE: Multidetector CT imaging of the chest, abdomen and pelvis was performed following the standard protocol without IV contrast. COMPARISON:  Prior noncontrast chest and abdominal/pelvic CTs 05/21/2018 and earlier. No prior contrast enhanced CTs in our system since 1997. FINDINGS: CT CHEST FINDINGS Cardiovascular: Chronic fusiform enlargement of the thoracic aorta, caliber in the arch appears increased since 2019 from approximately 34 millimeters at that time to 40 millimeters now. Furthermore, with narrow CT windows a dissection flap is visible in the arch and descending aorta (series 2, image 23). Even with narrow windows no flap is evident in the ascending aorta or proximal to the great vessel origins. Aneurysmal caliber of the descending thoracic aorta appears not significantly changed from 2019 up to 53 millimeters (series 2, image 35). Stable mild cardiomegaly with no pericardial effusion. Calcified coronary artery atherosclerosis. No periaortic hematoma is evident. Mediastinum/Nodes: Negative.  No lymphadenopathy. Lungs/Pleura: Major airways are patent. There is increased left lower lobe pulmonary opacity since 2018 that most resembles atelectasis. No definite pleural effusion. No other acute pulmonary opacity. Musculoskeletal: No acute osseous abnormality identified. CT ABDOMEN PELVIS FINDINGS Hepatobiliary: Negative noncontrast liver and gallbladder. Pancreas: Negative. Spleen: Negative. Adrenals/Urinary Tract: Normal adrenal glands. Chronic renal cysts appear stable since 2019. No hydronephrosis. Mildly distended but otherwise unremarkable urinary bladder. Stomach/Bowel: No dilated large or small bowel in the abdomen. Mild motion artifact in the lower abdomen. No free air or free fluid. Vascular/Lymphatic: The dissection flap visible in the descending thoracic aorta on these noncontrast images cannot be delineated in the abdomen. Fusiform aneurysmal  enlargement of the aorta at the level of the diaphragm has mildly increased since 2019 from 52-54 millimeters. Ectasia of the remaining abdominal aorta appears stable. Superimposed Aortoiliac calcified atherosclerosis. No lymphadenopathy. Reproductive: Negative noncontrast appearance. Other: No pelvic free fluid. Musculoskeletal: No acute osseous abnormality identified. IMPRESSION: 1. This study is positive for Thoracic Aortic Dissection despite the absence of IV contrast. A new dissection flap since 2019 is evident in the arch and the descending thoracic aorta. No flap is evident in the ascending aorta, favoring a Stanford type B dissection. The distal extent of the flap is unclear, but the abdominal aorta might be spared. 2. Subsequent increased fusiform aneurysmal enlargement of the arch since 2019 from 34 mm then to 40 mm now. But the larger descending and thoracoabdominal aortic aneurysmal segments appear stable since 2019 approximately 52-54 mm diameter. 3. No evidence of aortic rupture on this non-contrast exam. 4. Recommend Vascular Surgery Consultation to determine the next best diagnostic and treatment steps. Electronically Signed: By: Genevie Ann M.D. On: 10/28/2019 02:25   DG Chest 2 View  Result Date: 10/27/2019 CLINICAL DATA:  Chest pain EXAM: CHEST - 2 VIEW COMPARISON:  CT 11/18/2014 FINDINGS: Heart is enlarged. Tortuous, diffusely aneurysmal thoracic aorta. Lungs clear. No effusions. No acute bony abnormality. IMPRESSION: No acute cardiopulmonary disease. Cardiomegaly, diffusely aneurysmal thoracic aorta. This is similar to prior CT. Electronically Signed   By: Rolm Baptise M.D.   On: 10/27/2019 20:04   CT Chest Wo Contrast  Addendum Date: 10/28/2019   ADDENDUM REPORT: 10/28/2019 02:43 ADDENDUM: Critical Value/emergent results were called by telephone at the time of interpretation on 10/28/2019 at 0230 hours to Dr. Rudene Re , who verbally acknowledged these results. Electronically Signed    By: Genevie Ann M.D.   On: 10/28/2019 02:43   Result Date: 10/28/2019 CLINICAL DATA:  64 year old female with known thoracoabdominal aortic aneurysm and chest pain radiating to the back. Creatinine greater than 3 such that IV contrast is deferred at this time. EXAM: CT CHEST, ABDOMEN AND PELVIS WITHOUT CONTRAST TECHNIQUE: Multidetector CT imaging of the chest, abdomen and pelvis was performed following the standard protocol without IV contrast. COMPARISON:  Prior noncontrast chest and abdominal/pelvic CTs 05/21/2018 and earlier. No prior contrast enhanced CTs in our system since 1997. FINDINGS: CT CHEST FINDINGS Cardiovascular: Chronic fusiform enlargement of the thoracic aorta, caliber in the arch appears increased since 2019 from approximately 34 millimeters at that time to 40 millimeters now. Furthermore, with narrow CT windows a dissection flap is visible in the arch and descending aorta (series 2, image 23). Even with narrow windows no flap is evident in the ascending aorta or proximal to the great vessel origins. Aneurysmal caliber of the descending thoracic aorta appears not significantly changed from 2019 up to 53 millimeters (series 2, image 35). Stable mild cardiomegaly with no pericardial effusion. Calcified coronary artery atherosclerosis. No periaortic hematoma is evident. Mediastinum/Nodes: Negative.  No lymphadenopathy. Lungs/Pleura: Major airways are patent. There is increased left lower lobe pulmonary opacity since 2018 that most resembles atelectasis. No definite pleural effusion. No other acute pulmonary opacity. Musculoskeletal: No acute osseous abnormality identified. CT ABDOMEN PELVIS FINDINGS Hepatobiliary: Negative noncontrast liver and gallbladder. Pancreas: Negative. Spleen: Negative. Adrenals/Urinary Tract: Normal adrenal glands. Chronic renal cysts appear stable since 2019. No hydronephrosis. Mildly distended but otherwise unremarkable urinary bladder. Stomach/Bowel: No dilated large or  small bowel in the abdomen. Mild motion artifact in the lower abdomen. No free air or free fluid. Vascular/Lymphatic: The dissection flap visible in the descending thoracic aorta on these noncontrast images cannot be delineated in the abdomen. Fusiform aneurysmal enlargement of the aorta at the level of the diaphragm has mildly increased since 2019 from 52-54 millimeters. Ectasia of the remaining abdominal aorta appears stable. Superimposed Aortoiliac calcified atherosclerosis. No lymphadenopathy. Reproductive: Negative noncontrast appearance. Other: No pelvic free fluid. Musculoskeletal: No acute osseous abnormality identified. IMPRESSION: 1. This study is positive for Thoracic Aortic Dissection despite the absence of IV contrast. A new dissection flap since 2019 is evident in the arch and the descending thoracic aorta. No flap is evident in the ascending aorta, favoring a Stanford type B dissection. The distal extent of the flap is unclear, but the abdominal aorta might be spared. 2. Subsequent increased fusiform aneurysmal enlargement of the arch since 2019 from 34 mm then to 40 mm now. But the larger descending and thoracoabdominal aortic aneurysmal segments appear stable since 2019 approximately 52-54 mm diameter. 3. No evidence of aortic rupture on this non-contrast exam. 4. Recommend Vascular Surgery Consultation to determine the next best diagnostic and treatment steps. Electronically Signed: By: Genevie Ann M.D. On: 10/28/2019 02:25   CT Angio  Chest/Abd/Pel for Dissection W and/or Wo Contrast  Addendum Date: 10/28/2019   ADDENDUM REPORT: 10/28/2019 03:50 ADDENDUM: Study discussed by telephone with Dr. Rudene Re on 10/28/2019 at [place]. Electronically Signed   By: Genevie Ann M.D.   On: 10/28/2019 03:50   Result Date: 10/28/2019 CLINICAL DATA:  64 year old female with history of chronic kidney disease and chronic thoracoabdominal aneurysm. Acute pain radiating to the back, and evidence of new  dissection since 2019 in the thoracic aorta on noncontrast exam tonight. EXAM: CT ANGIOGRAPHY CHEST, ABDOMEN AND PELVIS TECHNIQUE: Multidetector CT imaging through the chest, abdomen and pelvis was performed using the standard protocol during bolus administration of intravenous contrast. Multiplanar reconstructed images and MIPs were obtained and reviewed to evaluate the vascular anatomy. CONTRAST:  124mL OMNIPAQUE IOHEXOL 350 MG/ML SOLN COMPARISON:  Noncontrast CT Chest, Abdomen, and Pelvis earlier tonight. FINDINGS: CTA CHEST FINDINGS Cardiovascular: Good contrast timing in the aorta. These images confirm a Stanford type B aortic dissection which begins immediately posterior to the left subclavian artery origin on series 4, image 21. The dissection flap continues through the descending aorta and thoracoabdominal aortic aneurysm into the proximal abdominal aorta as detailed below. No periaortic hematoma. Stable cardiac size. No pericardial effusion. There is a trace left pleural effusion evident with the addition of IV contrast, but stable. Proximal great vessels appear patent without dissection. Central pulmonary arteries also appear patent. Otherwise stable CT appearance of the chest from 0159 hours today. Review of the MIP images confirms the above findings. CTA ABDOMEN AND PELVIS FINDINGS VASCULAR The dissection continues into the upper abdominal aorta and the true lumen is in the midline with late or nonenhancing false lumen along both lateral walls (series 4, image 88). The celiac arises from the true lumen. The SMA arises from the true lumen. The dissection then terminates near the left main renal artery origin (series 4, images 96-98). Both main renal arteries are enhancing. The infrarenal and iliofemoral arteries are patent and stable since 2019 with calcified atherosclerosis. No periaortic hematoma. Review of the MIP images confirms the above findings. NON-VASCULAR Satisfactory appearance of liver,  spleen, pancreas and adrenal gland enhancement. Symmetric appearing renal enhancement with superimposed chronic renal cysts. Otherwise stable CT appearance of the abdomen and pelvis from 0159 hours today. Review of the MIP images confirms the above findings. IMPRESSION: 1. Confirmed Stanford type B Aortic Dissection which begins just distal to the left subclavian artery origin, continues into the proximal abdominal aorta, and terminates at the origin of the left renal artery. 2. Celiac, SMA, and main renal arteries arise from the true lumen. No periaortic hematoma. Underlying thoracoabdominal aortic aneurysm as detailed earlier tonight. 3. Otherwise stable CT appearance of the chest, abdomen, and pelvis from the 0159 hour study today; there is a trace left pleural effusion. Electronically Signed: By: Genevie Ann M.D. On: 10/28/2019 03:28   US Abdomen Limited RUQ  Result Date: 10/28/2019 CLINICAL DATA:  Epigastric pain EXAM: ULTRASOUND ABDOMEN LIMITED RIGHT UPPER QUADRANT COMPARISON:  CT May 21, 2018 FINDINGS: Gallbladder: No gallstones or wall thickening visualized. No sonographic Murphy sign noted by sonographer. Common bile duct: Diameter: 2.8 mm Liver: No focal lesion identified. Within normal limits in parenchymal echogenicity. Portal vein is patent on color Doppler imaging with normal direction of blood flow towards the liver. Other: There is incidental note made of a proximal aortic aneurysm measuring up to 4.8 cm, with internal echogenicity which could be calcified or noncalcified atherosclerosis. IMPRESSION: 1. Normal liver and  gallbladder. 2. Proximal aortic aneurysm measuring 4.8 cm, grossly unchanged given differences in technique. Recommend followup by abdomen and pelvis CTA in 6 months. This recommendation follows ACR consensus guidelines: White Paper of the ACR Incidental Findings Committee II on Vascular Findings. J Am Coll Radiol 2013; 10:789-794. Aortic aneurysm NOS (ICD10-I71.9) Electronically  Signed   By: Prudencio Pair M.D.   On: 10/28/2019 01:03        Scheduled Meds: . amLODipine  10 mg Oral Daily  . Chlorhexidine Gluconate Cloth  6 each Topical Daily  . heparin injection (subcutaneous)  5,000 Units Subcutaneous Q8H  . irbesartan  300 mg Oral Daily   Continuous Infusions: . esmolol Stopped (10/28/19 1501)  . lactated ringers 50 mL/hr at 10/29/19 0600  . niCARDipine Stopped (10/28/19 1338)     LOS: 1 day    Time spent: 32 mins    Wyvonnia Dusky, MD [facility] Pager [phone]  If 7PM-7AM, please contact night-coverage [email or web address] Password [facility] 10/29/2019, 8:47 AM

## 2019-10-29 NOTE — Progress Notes (Signed)
[facility] Daily Progress Note   Subjective: Patient without complaint this AM.  No issues overnight.  Objective: Vitals:   10/29/19 0400 10/29/19 0500 10/29/19 0600 10/29/19 0925  BP: 124/65 120/75 135/80 (!) 143/87  Pulse: 75 97 71   Resp: (!) 21 19 18    Temp: 99.2 F (37.3 C)     TempSrc: Oral     SpO2: 93% 97% 95%   Weight:  66.9 kg    Height:        Intake/Output Summary (Last 24 hours) at 10/29/2019 1101 Last data filed at 10/29/2019 0600 Gross per 24 hour  Intake 2227.27 ml  Output 550 ml  Net 1677.27 ml   Physical Exam: A&Ox3, NAD CV: RRR Pulmonary: CTA Bilaterally Abdomen: Soft, Nontender, Nondistended Vascular: Warm distally to toes, good capillary refill   Laboratory: CBC    Component Value Date/Time   WBC 6.9 10/29/2019 0551   HGB 9.2 (L) 10/29/2019 0551   HCT 30.4 (L) 10/29/2019 0551   PLT 268 10/29/2019 0551   BMET    Component Value Date/Time   NA 142 10/29/2019 0551   K 4.8 10/29/2019 0551   CL 116 (H) 10/29/2019 0551   CO2 19 (L) 10/29/2019 0551   GLUCOSE 77 10/29/2019 0551   BUN 34 (H) 10/29/2019 0551   CREATININE 2.88 (H) 10/29/2019 0551   CALCIUM 9.8 10/29/2019 0551   GFRNONAA 16 (L) 10/29/2019 0551   GFRAA 19 (L) 10/29/2019 0551   Assessment/Planning: The patient is a 64 year old female with multiple medical issues (see below) who presented to the [facility] emergency department via EMS complaining of "abdominal pain". Found to have "a new dissection flap since 2019 is evident in the arch and the descending thoracic aorta. No flap is evident in the ascending aorta, favoring a Stanford type B dissection"  1. Thoracoabdominal aneurysm: Patient remains stable. No issues overnight. Recommend systolic pressure less than 120 and follow-up with Dr. Sammuel Hines as she will most likely need repair with new diagnosis of dissection. Discussed the importance of follow up with Dr. Sammuel Hines with patient again  today. Patient expresses her understanding.   2. Renal Disease: Patient is being followed by nephrology  3. Tobacco Abuse: We had a discussion for approximately three minutes regarding the absolute need for smoking cessation due to the deleterious nature of tobacco on the vascular system. We discussed the tobacco use would diminish patency of any intervention, and likely significantly worsen progression of disease.   Discussed with Dr. Ellis Parents Aristotle Lieb PA-C 10/29/2019 11:01 AM

## 2019-10-30 DIAGNOSIS — I10 Essential (primary) hypertension: Secondary | ICD-10-CM

## 2019-10-30 LAB — BASIC METABOLIC PANEL
Anion gap: 9 (ref 5–15)
BUN: 32 mg/dL — ABNORMAL HIGH (ref 8–23)
CO2: 18 mmol/L — ABNORMAL LOW (ref 22–32)
Calcium: 10.2 mg/dL (ref 8.9–10.3)
Chloride: 112 mmol/L — ABNORMAL HIGH (ref 98–111)
Creatinine, Ser: 3.15 mg/dL — ABNORMAL HIGH (ref 0.44–1.00)
GFR calc Af Amer: 17 mL/min — ABNORMAL LOW (ref >60)
GFR calc non Af Amer: 15 mL/min — ABNORMAL LOW (ref >60)
Glucose, Bld: 82 mg/dL (ref 70–99)
Potassium: 4.6 mmol/L (ref 3.5–5.1)
Sodium: 139 mmol/L (ref 135–145)

## 2019-10-30 LAB — CBC
HCT: 32.6 % — ABNORMAL LOW (ref 36.0–46.0)
Hemoglobin: 10.2 g/dL — ABNORMAL LOW (ref 12.0–15.0)
MCH: 28 pg (ref 26.0–34.0)
MCHC: 31.3 g/dL (ref 30.0–36.0)
MCV: 89.6 fL (ref 80.0–100.0)
Platelets: 325 10*3/uL (ref 150–400)
RBC: 3.64 MIL/uL — ABNORMAL LOW (ref 3.87–5.11)
RDW: 13.6 % (ref 11.5–15.5)
WBC: 11.2 10*3/uL — ABNORMAL HIGH (ref 4.0–10.5)
nRBC: 0 % (ref 0.0–0.2)

## 2019-10-30 MED ORDER — FUROSEMIDE 20 MG PO TABS
10.0000 mg | ORAL_TABLET | Freq: Every day | ORAL | Status: DC
  Administered 2019-10-30 – 2019-11-01 (×3): 10 mg via ORAL
  Filled 2019-10-30 (×3): qty 1

## 2019-10-30 NOTE — Progress Notes (Signed)
PROGRESS NOTE    Elizabeth Roy  [id number] DOB: December 09, 1955 DOA: 10/27/2019 PCP: Ronnell Freshwater, NP       Assessment & Plan:   Principal Problem:   Aortic dissection-type B Active Problems:   Abdominal aortic aneurysm without rupture (HCC)   Essential hypertension   Tobacco use disorder   Iron deficiency anemia   CKD (chronic kidney disease), stage IIIb   Normocytic anemia   CKD (chronic kidney disease), stage IV (HCC)   Type B Aortic Dissection: hx of thoracic AAA, hypertension. D/c esmolol & nicardipine drip. Continue on amlodipine, irbesartan, bisoprolol & start lasix as per nephro. Keep BP <120/80. Nephro recs apprec. Vascular surg recommends medical management and referral back to Dr. Sammuel Hines w/ new dissection will most likely need a repair. Pt verbalized her understanding. Vasc surg recs apprec. Dilaudid & oxycodone prn for pain.   HTN: uncontrolled. Keep BP <120/80. Continue on amlodipine, irbesartan, bisoprolol & start lasix as per nephro.  AKI on CKD IV: baseline creatinine 2.88/GFR 19. Cr is trending up today. Will continue to monitor   Normocytic anemia: no need for a transfusion at this time. Will continue to monitor   Generalized weakness: PT consulted   DVT prophylaxis: heparin Code Status: full  Family Communication: Disposition Plan:    Consultants:   Vascular surgery    nephro   Procedures:   Antimicrobials: n/a   Subjective: Pt c/o intermittent shoulder pain   Objective: Vitals:   10/30/19 0704 10/30/19 0934 10/30/19 1129 10/30/19 1255  BP: 129/82 126/79 (!) 141/84 111/65  Pulse: (!) 58 62  70  Resp:  18  (!) 22  Temp:  98.2 F (36.8 C)  98.9 F (37.2 C)  TempSrc:  Oral  Oral  SpO2:  100%  100%  Weight:      Height:        Intake/Output Summary (Last 24 hours) at 10/30/2019 1309 Last data filed at 10/30/2019 0955 Gross per 24 hour  Intake 480 ml  Output 1800 ml  Net -1320 ml   Filed Weights   10/27/19 1934 10/29/19  0500 10/29/19 1949  Weight: 68 kg 66.9 kg 67.6 kg    Examination:  General exam: Appears calm and comfortable  Respiratory system: diminished breath sounds b/l Cardiovascular system: S1 & S2 +. No rubs, gallops or clicks. Gastrointestinal system: Abdomen is nondistended, soft and nontender.Normal bowel sounds heard. Central nervous system: Alert and oriented. Moves all 4 extremities Psychiatry: Judgement and insight appear normal. Flat mood and affect     Data Reviewed: I have personally reviewed following labs and imaging studies  CBC: Recent Labs  Lab 10/27/19 2010 10/28/19 0455 10/29/19 0551 10/30/19 0839  WBC 8.6 8.9 6.9 11.2*  HGB 9.2* 8.8* 9.2* 10.2*  HCT 30.2* 28.8* 30.4* 32.6*  MCV 91.5 92.6 91.6 89.6  PLT 275 271 268 [id number]   Basic Metabolic Panel: Recent Labs  Lab 10/27/19 1939 10/28/19 0455 10/29/19 0551 10/30/19 0839  NA 138 139 142 139  K 5.1 5.1 4.8 4.6  CL 109 113* 116* 112*  CO2 20* 20* 19* 18*  GLUCOSE 125* 106* 77 82  BUN 46* 43* 34* 32*  CREATININE 3.14* 2.73* 2.88* 3.15*  CALCIUM 10.1 9.5 9.8 10.2   GFR: Estimated Creatinine Clearance: 14.9 mL/min (A) (by C-G formula based on SCr of 3.15 mg/dL (H)). Liver Function Tests: Recent Labs  Lab 10/27/19 2218  AST 13*  ALT 8  ALKPHOS 57  BILITOT 0.5  PROT 7.1  ALBUMIN 3.9   Recent Labs  Lab 10/27/19 2218  LIPASE 34   No results for input(s): AMMONIA in the last 168 hours. Coagulation Profile: No results for input(s): INR, PROTIME in the last 168 hours. Cardiac Enzymes: No results for input(s): CKTOTAL, CKMB, CKMBINDEX, TROPONINI in the last 168 hours. BNP (last 3 results) No results for input(s): PROBNP in the last 8760 hours. HbA1C: No results for input(s): HGBA1C in the last 72 hours. CBG: Recent Labs  Lab 10/28/19 1939  GLUCAP 95   Lipid Profile: No results for input(s): CHOL, HDL, LDLCALC, TRIG, CHOLHDL, LDLDIRECT in the last 72 hours. Thyroid Function Tests: No results  for input(s): TSH, T4TOTAL, FREET4, T3FREE, THYROIDAB in the last 72 hours. Anemia Panel: No results for input(s): VITAMINB12, FOLATE, FERRITIN, TIBC, IRON, RETICCTPCT in the last 72 hours. Sepsis Labs: No results for input(s): PROCALCITON, LATICACIDVEN in the last 168 hours.  Recent Results (from the past 240 hour(s))  Respiratory Panel by RT PCR (Flu A&B, Covid) - Nasopharyngeal Swab     Status: None   Collection Time: 10/28/19  2:41 AM   Specimen: Nasopharyngeal Swab  Result Value Ref Range Status   SARS Coronavirus 2 by RT PCR NEGATIVE NEGATIVE Final    Comment: (NOTE) SARS-CoV-2 target nucleic acids are NOT DETECTED. The SARS-CoV-2 RNA is generally detectable in upper respiratoy specimens during the acute phase of infection. The lowest concentration of SARS-CoV-2 viral copies this assay can detect is 131 copies/mL. A negative result does not preclude SARS-Cov-2 infection and should not be used as the sole basis for treatment or other patient management decisions. A negative result may occur with  improper specimen collection/handling, submission of specimen other than nasopharyngeal swab, presence of viral mutation(s) within the areas targeted by this assay, and inadequate number of viral copies (<131 copies/mL). A negative result must be combined with clinical observations, patient history, and epidemiological information. The expected result is Negative. Fact Sheet for Patients:  PinkCheek.be Fact Sheet for Healthcare Providers:  GravelBags.it This test is not yet ap proved or cleared by the [place] FDA and  has been authorized for detection and/or diagnosis of SARS-CoV-2 by FDA under an Emergency Use Authorization (EUA). This EUA will remain  in effect (meaning this test can be used) for the duration of the COVID-19 declaration under Section 564(b)(1) of the Act, 21 U.S.C. section 360bbb-3(b)(1), unless the  authorization is terminated or revoked sooner.    Influenza A by PCR NEGATIVE NEGATIVE Final   Influenza B by PCR NEGATIVE NEGATIVE Final    Comment: (NOTE) The Xpert Xpress SARS-CoV-2/FLU/RSV assay is intended as an aid in  the diagnosis of influenza from Nasopharyngeal swab specimens and  should not be used as a sole basis for treatment. Nasal washings and  aspirates are unacceptable for Xpert Xpress SARS-CoV-2/FLU/RSV  testing. Fact Sheet for Patients: PinkCheek.be Fact Sheet for Healthcare Providers: GravelBags.it This test is not yet approved or cleared by the [place] FDA and  has been authorized for detection and/or diagnosis of SARS-CoV-2 by  FDA under an Emergency Use Authorization (EUA). This EUA will remain  in effect (meaning this test can be used) for the duration of the  Covid-19 declaration under Section 564(b)(1) of the Act, 21  U.S.C. section 360bbb-3(b)(1), unless the authorization is  terminated or revoked. Performed at [facility], [address] [place], [place] [address]   MRSA PCR Screening     Status: None   Collection Time: 10/28/19  1:37  PM   Specimen: Nasal Mucosa; Nasopharyngeal  Result Value Ref Range Status   MRSA by PCR NEGATIVE NEGATIVE Final    Comment:        The GeneXpert MRSA Assay (FDA approved for NASAL specimens only), is one component of a comprehensive MRSA colonization surveillance program. It is not intended to diagnose MRSA infection nor to guide or monitor treatment for MRSA infections. Performed at [facility], [address], [place], [place] [address]          Radiology Studies: No results found.      Scheduled Meds: . amLODipine  10 mg Oral Daily  . bisoprolol  10 mg Oral Daily  . Chlorhexidine Gluconate Cloth  6 each Topical Daily  . furosemide  10 mg Oral Daily  . heparin injection (subcutaneous)  5,000 Units Subcutaneous  Q8H  . irbesartan  300 mg Oral Daily   Continuous Infusions:    LOS: 2 days    Time spent: 33 mins    Wyvonnia Dusky, MD [facility] Pager [phone]  If 7PM-7AM, please contact night-coverage [email or web address] Password [facility] 10/30/2019, 1:09 PM

## 2019-10-30 NOTE — Evaluation (Signed)
Physical Therapy Evaluation Patient Details Name: Elizabeth Roy MRN: [id number] DOB: [birth year] Today's Date: 10/30/2019   History of Present Illness  Elizabeth Roy is a 1yoF who comes to [facility] with CP/ABD pain. Pt admitted with type B Aortic Dissection: hx of thoracic AAA, hypertension. Vascular is recommending SBP be maintained <116mmHg.  Clinical Impression  Pt admitted with above diagnosis. Pt currently with functional limitations due to the deficits listed below (see "PT Problem List"). Upon entry, pt in bed, awake and agreeable to participate. RN reports SBP now within safe range (<180mmHg). The pt is alert and oriented x4, pleasant, conversational, and generally a good historian. Supervision for bedmobility and transfers, intermittent minGuard Assist for slow AMB in hall. Pt notes some loss of strength and minimal imbalance, but asserts confidence in being able to safely perform her ADL and mobility at home. Functional mobility assessment demonstrates increased effort/time requirements, poor tolerance, and need for physical assistance, whereas the patient performed these at a higher level of independence PTA. Encouraged pt to consider [facility] use for longer distance AMB at DC until she has returned to baseline. Pt will benefit from skilled PT intervention to increase independence and safety with basic mobility in preparation for discharge to the venue listed below.       Follow Up Recommendations Home health PT    Equipment Recommendations  None recommended by PT    Recommendations for Other Services       Precautions / Restrictions Precautions Precautions: Fall Restrictions Weight Bearing Restrictions: No      Mobility  Bed Mobility Overal bed mobility: Modified Independent             General bed mobility comments: slow, moderately labored, moves fairly well.  Transfers Overall transfer level: Needs assistance Equipment used: None Transfers: Sit to/from Stand Sit to Stand:  Supervision         General transfer comment: slow, moderately labored, moves fairly well.  Ambulation/Gait Ambulation/Gait assistance: Min guard Gait Distance (Feet): 190 Feet Assistive device: None Gait Pattern/deviations: WFL(Within Functional Limits) Gait velocity: 0.36m/s Gait velocity interpretation: 1.31 - 2.62 ft/sec, indicative of limited community ambulator General Gait Details: HR <90BPM throughout; no c/o dizziness  Stairs            Wheelchair Mobility    Modified Rankin (Stroke Patients Only)       Balance Overall balance assessment: Mild deficits observed, not formally tested;Independent                                           Pertinent Vitals/Pain Pain Assessment: 0-10 Pain Score: 5  Pain Location: substernal pain Pain Intervention(s): Limited activity within patient's tolerance;Monitored during session;Premedicated before session    [place] expects to be discharged to:: Private residence Living Arrangements: Alone Available Help at Discharge: Family(sister, neices nephews nearby, most work during th eday) Type of Home: Mobile home Home Access: Stairs to enter Entrance Stairs-Rails: Can reach both Entrance Stairs-Number of Steps: 4-5 Home Layout: One level Home Equipment: Cane - single point Additional Comments: Not using DME PTA. WAlks alot for work.    Prior Function Level of Independence: Independent         Comments: Drives     Hand Dominance   Dominant Hand: Right    Extremity/Trunk Assessment        Lower Extremity Assessment Lower Extremity Assessment: Generalized weakness;Overall  WFL for tasks assessed       Communication   Communication: No difficulties  Cognition Arousal/Alertness: Awake/alert Behavior During Therapy: WFL for tasks assessed/performed Overall Cognitive Status: Within Functional Limits for tasks assessed                                         General Comments      Exercises     Assessment/Plan    PT Assessment Patient needs continued PT services  PT Problem List Decreased strength;Decreased activity tolerance;Decreased balance;Decreased mobility;Cardiopulmonary status limiting activity       PT Treatment Interventions DME instruction;Gait training;Stair training;Functional mobility training;Therapeutic activities;Therapeutic exercise;Balance training;Patient/family education    PT Goals (Current goals can be found in the Care Plan section)  Acute Rehab PT Goals Patient Stated Goal: Return to home and regain strength in AMB PT Goal Formulation: With patient Time For Goal Achievement: 11/13/19 Potential to Achieve Goals: Good    Frequency Min 2X/week   Barriers to discharge Decreased caregiver support      Co-evaluation               AM-PAC PT "6 Clicks" Mobility  Outcome Measure Help needed turning from your back to your side while in a flat bed without using bedrails?: None Help needed moving from lying on your back to sitting on the side of a flat bed without using bedrails?: None Help needed moving to and from a bed to a chair (including a wheelchair)?: None Help needed standing up from a chair using your arms (e.g., wheelchair or bedside chair)?: None Help needed to walk in hospital room?: A Little Help needed climbing 3-5 steps with a railing? : A Little 6 Click Score: 22    End of Session Equipment Utilized During Treatment: Gait belt Activity Tolerance: Patient tolerated treatment well Patient left: in bed;with call bell/phone within reach Nurse Communication: Mobility status PT Visit Diagnosis: Unsteadiness on feet (R26.81);Other abnormalities of gait and mobility (R26.89);Muscle weakness (generalized) (M62.81);Difficulty in walking, not elsewhere classified (R26.2)    Time: [id number] PT Time Calculation (min) (ACUTE ONLY): 18 min   Charges:   PT Evaluation $PT Eval Low Complexity: 1  Low          2:06 PM, 10/30/19 [name], PT, DPT Physical Therapist - [facility]  [phone] ([place])   Mikaelyn Arthurs C 10/30/2019, 2:04 PM

## 2019-10-30 NOTE — Progress Notes (Signed)
PT Cancellation Note  Patient Details Name: Elizabeth Roy MRN: [id number] DOB: 09-Jan-1956   Cancelled Treatment:    Reason Eval/Treat Not Completed: Medical issues which prohibited therapy(Attempted PT evaluation; SBP taken twice both in 140s LUE. Per vascular precautions to maintain SBP <150mmHg. Will hold and attempt again at later date/time.)  12:38 PM, 10/30/19 [name], PT, DPT Physical Therapist - [place] [phone]      [name] 10/30/2019, 12:38 PM

## 2019-10-30 NOTE — Care Management Important Message (Signed)
Important Message  Patient Details  Name: Elizabeth Roy MRN: [id number] Date of Birth: [birth year]   Medicare Important Message Given:  Yes     Dannette Barbara 10/30/2019, 11:19 AM

## 2019-10-30 NOTE — Progress Notes (Signed)
[facility]  ROUNDING NOTE   Subjective:   Moved to [place].   Some elevated blood pressures overnight  Objective:  Vital signs in last 24 hours:  Temp:  [98.2 F (36.8 C)-99 F (37.2 C)] 98.2 F (36.8 C) (01/15 0934) Pulse Rate:  [58-77] 62 (01/15 0934) Resp:  [16-21] 18 (01/15 0934) BP: (126-158)/(79-92) 141/84 (01/15 1129) SpO2:  [98 %-100 %] 100 % (01/15 0934) Weight:  [67.6 kg] 67.6 kg (01/14 1949)  Weight change: 0.7 kg Filed Weights   10/27/19 1934 10/29/19 0500 10/29/19 1949  Weight: 68 kg 66.9 kg 67.6 kg    Intake/Output: I/O last 3 completed shifts: In: [id number] [P.O.:240; I.V.:548] Out: 2350 [Urine:2350]   Intake/Output this shift:  Total I/O In: 240 [P.O.:240] Out: -   Physical Exam: General: NAD, laying in bed  Head: Normocephalic, atraumatic. Moist oral mucosal membranes  Eyes: Anicteric, PERRL  Neck: Supple, trachea midline  Lungs:  Clear to auscultation  Heart: Regular rate and rhythm  Abdomen:  Soft, nontender,   Extremities:  no peripheral edema.  Neurologic: Nonfocal, moving all four extremities  Skin: No lesions        Basic Metabolic Panel: Recent Labs  Lab 10/27/19 1939 10/27/19 1939 10/28/19 0455 10/29/19 0551 10/30/19 0839  NA 138  --  139 142 139  K 5.1  --  5.1 4.8 4.6  CL 109  --  113* 116* 112*  CO2 20*  --  20* 19* 18*  GLUCOSE 125*  --  106* 77 82  BUN 46*  --  43* 34* 32*  CREATININE 3.14*  --  2.73* 2.88* 3.15*  CALCIUM 10.1   < > 9.5 9.8 10.2   < > = values in this interval not displayed.    Liver Function Tests: Recent Labs  Lab 10/27/19 2218  AST 13*  ALT 8  ALKPHOS 57  BILITOT 0.5  PROT 7.1  ALBUMIN 3.9   Recent Labs  Lab 10/27/19 2218  LIPASE 34   No results for input(s): AMMONIA in the last 168 hours.  CBC: Recent Labs  Lab 10/27/19 2010 10/28/19 0455 10/29/19 0551 10/30/19 0839  WBC 8.6 8.9 6.9 11.2*  HGB 9.2* 8.8* 9.2* 10.2*  HCT 30.2* 28.8* 30.4* 32.6*  MCV 91.5 92.6 91.6  89.6  PLT 275 271 268 325    Cardiac Enzymes: No results for input(s): CKTOTAL, CKMB, CKMBINDEX, TROPONINI in the last 168 hours.  BNP: Invalid input(s): POCBNP  CBG: Recent Labs  Lab 10/28/19 1939  GLUCAP 95    Microbiology: Results for orders placed or performed during the hospital encounter of 10/27/19  Respiratory Panel by RT PCR (Flu A&B, Covid) - Nasopharyngeal Swab     Status: None   Collection Time: 10/28/19  2:41 AM   Specimen: Nasopharyngeal Swab  Result Value Ref Range Status   SARS Coronavirus 2 by RT PCR NEGATIVE NEGATIVE Final    Comment: (NOTE) SARS-CoV-2 target nucleic acids are NOT DETECTED. The SARS-CoV-2 RNA is generally detectable in upper respiratoy specimens during the acute phase of infection. The lowest concentration of SARS-CoV-2 viral copies this assay can detect is 131 copies/mL. A negative result does not preclude SARS-Cov-2 infection and should not be used as the sole basis for treatment or other patient management decisions. A negative result may occur with  improper specimen collection/handling, submission of specimen other than nasopharyngeal swab, presence of viral mutation(s) within the areas targeted by this assay, and inadequate number of viral copies (<131 copies/mL).  A negative result must be combined with clinical observations, patient history, and epidemiological information. The expected result is Negative. Fact Sheet for Patients:  PinkCheek.be Fact Sheet for Healthcare Providers:  GravelBags.it This test is not yet ap proved or cleared by the [place] FDA and  has been authorized for detection and/or diagnosis of SARS-CoV-2 by FDA under an Emergency Use Authorization (EUA). This EUA will remain  in effect (meaning this test can be used) for the duration of the COVID-19 declaration under Section 564(b)(1) of the Act, 21 U.S.C. section 360bbb-3(b)(1), unless the  authorization is terminated or revoked sooner.    Influenza A by PCR NEGATIVE NEGATIVE Final   Influenza B by PCR NEGATIVE NEGATIVE Final    Comment: (NOTE) The Xpert Xpress SARS-CoV-2/FLU/RSV assay is intended as an aid in  the diagnosis of influenza from Nasopharyngeal swab specimens and  should not be used as a sole basis for treatment. Nasal washings and  aspirates are unacceptable for Xpert Xpress SARS-CoV-2/FLU/RSV  testing. Fact Sheet for Patients: PinkCheek.be Fact Sheet for Healthcare Providers: GravelBags.it This test is not yet approved or cleared by the [place] FDA and  has been authorized for detection and/or diagnosis of SARS-CoV-2 by  FDA under an Emergency Use Authorization (EUA). This EUA will remain  in effect (meaning this test can be used) for the duration of the  Covid-19 declaration under Section 564(b)(1) of the Act, 21  U.S.C. section 360bbb-3(b)(1), unless the authorization is  terminated or revoked. Performed at [facility], [address] [place], [place] [address]   MRSA PCR Screening     Status: None   Collection Time: 10/28/19  1:37 PM   Specimen: Nasal Mucosa; Nasopharyngeal  Result Value Ref Range Status   MRSA by PCR NEGATIVE NEGATIVE Final    Comment:        The GeneXpert MRSA Assay (FDA approved for NASAL specimens only), is one component of a comprehensive MRSA colonization surveillance program. It is not intended to diagnose MRSA infection nor to guide or monitor treatment for MRSA infections. Performed at [facility], [address] [place], [place] [address]     Coagulation Studies: No results for input(s): LABPROT, INR in the last 72 hours.  Urinalysis: Recent Labs    10/27/19 2347  COLORURINE STRAW*  LABSPEC 1.010  PHURINE 6.0  GLUCOSEU NEGATIVE  HGBUR NEGATIVE  BILIRUBINUR NEGATIVE  KETONESUR NEGATIVE  PROTEINUR 100*  NITRITE  NEGATIVE  LEUKOCYTESUR NEGATIVE      Imaging: No results found.   Medications:    . amLODipine  10 mg Oral Daily  . bisoprolol  10 mg Oral Daily  . Chlorhexidine Gluconate Cloth  6 each Topical Daily  . furosemide  10 mg Oral Daily  . heparin injection (subcutaneous)  5,000 Units Subcutaneous Q8H  . irbesartan  300 mg Oral Daily   acetaminophen, hydrALAZINE, HYDROmorphone (DILAUDID) injection, ondansetron (ZOFRAN) IV, oxyCODONE-acetaminophen  Assessment/ Plan:  Ms. Elizabeth Roy is a 64 y.o. black female hypertension osteoarthritis who is admitted to [facility] on 10/27/2019 for Aortic dissection ([place]) [I71.00] Epigastric pain [R10.13] Dissection of thoracic aorta ([place]) [I71.01]  1. Acute renal failure on chronic kidney disease stage IV with proteinuria: baseline creatinine of 2.88, GFR of 19 on 04/02/2019.  - Nephrology follow up with Dr. Candiss Norse on 2/22 @ 2pm. Will try to move appointment up.   2. Hypertension: Keep below 120/80. Current regimen of bisoprolol, irbesartan and amlodipine Weaned off esmolol and nicardipine gtt -  start furosemide today.  - Most likely will need to be discharged on hydralazine as well.   3. Thoracoabdominal aneurysm- [place] type B dissection   - appreciate vascular input.   4. Anemia with chronic kidney disease: hemoglobin 10.2- stable     LOS: 2 Stephane Junkins 1/15/202111:35 AM

## 2019-10-31 LAB — CBC
HCT: 30.6 % — ABNORMAL LOW (ref 36.0–46.0)
Hemoglobin: 9.4 g/dL — ABNORMAL LOW (ref 12.0–15.0)
MCH: 27.6 pg (ref 26.0–34.0)
MCHC: 30.7 g/dL (ref 30.0–36.0)
MCV: 89.7 fL (ref 80.0–100.0)
Platelets: 269 10*3/uL (ref 150–400)
RBC: 3.41 MIL/uL — ABNORMAL LOW (ref 3.87–5.11)
RDW: 13.8 % (ref 11.5–15.5)
WBC: 10.8 10*3/uL — ABNORMAL HIGH (ref 4.0–10.5)
nRBC: 0 % (ref 0.0–0.2)

## 2019-10-31 LAB — BASIC METABOLIC PANEL
Anion gap: 10 (ref 5–15)
BUN: 39 mg/dL — ABNORMAL HIGH (ref 8–23)
CO2: 18 mmol/L — ABNORMAL LOW (ref 22–32)
Calcium: 10 mg/dL (ref 8.9–10.3)
Chloride: 112 mmol/L — ABNORMAL HIGH (ref 98–111)
Creatinine, Ser: 3.23 mg/dL — ABNORMAL HIGH (ref 0.44–1.00)
GFR calc Af Amer: 17 mL/min — ABNORMAL LOW (ref >60)
GFR calc non Af Amer: 14 mL/min — ABNORMAL LOW (ref >60)
Glucose, Bld: 79 mg/dL (ref 70–99)
Potassium: 4.5 mmol/L (ref 3.5–5.1)
Sodium: 140 mmol/L (ref 135–145)

## 2019-10-31 MED ORDER — DOCUSATE SODIUM 100 MG PO CAPS
100.0000 mg | ORAL_CAPSULE | Freq: Two times a day (BID) | ORAL | Status: DC
  Administered 2019-10-31 – 2019-11-01 (×3): 100 mg via ORAL
  Filled 2019-10-31 (×3): qty 1

## 2019-10-31 MED ORDER — BISACODYL 5 MG PO TBEC
5.0000 mg | DELAYED_RELEASE_TABLET | Freq: Every day | ORAL | Status: DC
  Administered 2019-10-31 – 2019-11-01 (×2): 5 mg via ORAL
  Filled 2019-10-31 (×2): qty 1

## 2019-10-31 NOTE — Progress Notes (Signed)
PROGRESS NOTE    Elizabeth [place]  [id number] DOB: 05-31-1956 DOA: 10/27/2019 PCP: Ronnell Freshwater, NP       Assessment & Plan:   Principal Problem:   Aortic dissection-type B Active Problems:   Abdominal aortic aneurysm without rupture (HCC)   Essential hypertension   Tobacco use disorder   Iron deficiency anemia   CKD (chronic kidney disease), stage IIIb   Normocytic anemia   CKD (chronic kidney disease), stage IV (HCC)   Type B Aortic Dissection: hx of thoracic AAA, hypertension. D/c esmolol & nicardipine drip. Continue on amlodipine, irbesartan, bisoprolol & lasix as per nephro. Keep BP <120/80. Nephro recs apprec. Vascular surg recommends medical management and referral back to Dr. Sammuel Hines w/ new dissection will most likely need a repair. Pt verbalized her understanding. Vasc surg recs apprec. Dilaudid & oxycodone prn for pain.   HTN: uncontrolled. Keep BP <120/80. Continue on amlodipine, irbesartan, bisoprolol & lasix as per nephro.  AKI on CKD IV: baseline creatinine 2.88/GFR 19. Cr is trending up today. Management as per nephro   Normocytic anemia: H&H are stable. No need for a transfusion at this time. Will continue to monitor   Generalized weakness: PT recs home health. Home health orders place  Leukocytosis: likely reactive. Will continue to monitor   DVT prophylaxis: heparin Code Status: full  Family Communication: Disposition Plan:    Consultants:   Vascular surgery    nephro   Procedures:   Antimicrobials: n/a   Subjective: Pt c/o intermittent shoulder pain still.  Objective: Vitals:   10/30/19 1255 10/30/19 1947 10/31/19 0423 10/31/19 1251  BP: 111/65 112/71 127/77 119/80  Pulse: 70 60 67 (!) 53  Resp: (!) 22 18 18 19   Temp: 98.9 F (37.2 C) 99.4 F (37.4 C) 98.9 F (37.2 C) (!) 97.4 F (36.3 C)  TempSrc: Oral Oral Oral Oral  SpO2: 100% 99% 98% 99%  Weight:      Height:        Intake/Output Summary (Last 24 hours) at  10/31/2019 1308 Last data filed at 10/30/2019 1851 Gross per 24 hour  Intake 240 ml  Output --  Net 240 ml   Filed Weights   10/27/19 1934 10/29/19 0500 10/29/19 1949  Weight: 68 kg 66.9 kg 67.6 kg    Examination:  General exam: Appears calm and comfortable  Respiratory system: decreased breath sounds b/l Cardiovascular system: S1 & S2 +. No rubs, gallops or clicks. Gastrointestinal system: Abdomen is nondistended, soft and nontender. Normal bowel sounds heard. Central nervous system: Alert and oriented. Moves all 4 extremities Psychiatry: Judgement and insight appear normal. Flat mood and affect     Data Reviewed: I have personally reviewed following labs and imaging studies  CBC: Recent Labs  Lab 10/27/19 2010 10/28/19 0455 10/29/19 0551 10/30/19 0839 10/31/19 0441  WBC 8.6 8.9 6.9 11.2* 10.8*  HGB 9.2* 8.8* 9.2* 10.2* 9.4*  HCT 30.2* 28.8* 30.4* 32.6* 30.6*  MCV 91.5 92.6 91.6 89.6 89.7  PLT 275 271 268 325 [id number]   Basic Metabolic Panel: Recent Labs  Lab 10/27/19 1939 10/28/19 0455 10/29/19 0551 10/30/19 0839 10/31/19 0441  NA 138 139 142 139 140  K 5.1 5.1 4.8 4.6 4.5  CL 109 113* 116* 112* 112*  CO2 20* 20* 19* 18* 18*  GLUCOSE 125* 106* 77 82 79  BUN 46* 43* 34* 32* 39*  CREATININE 3.14* 2.73* 2.88* 3.15* 3.23*  CALCIUM 10.1 9.5 9.8 10.2 10.0   GFR: Estimated Creatinine Clearance:  14.5 mL/min (A) (by C-G formula based on SCr of 3.23 mg/dL (H)). Liver Function Tests: Recent Labs  Lab 10/27/19 2218  AST 13*  ALT 8  ALKPHOS 57  BILITOT 0.5  PROT 7.1  ALBUMIN 3.9   Recent Labs  Lab 10/27/19 2218  LIPASE 34   No results for input(s): AMMONIA in the last 168 hours. Coagulation Profile: No results for input(s): INR, PROTIME in the last 168 hours. Cardiac Enzymes: No results for input(s): CKTOTAL, CKMB, CKMBINDEX, TROPONINI in the last 168 hours. BNP (last 3 results) No results for input(s): PROBNP in the last 8760 hours. HbA1C: No results  for input(s): HGBA1C in the last 72 hours. CBG: Recent Labs  Lab 10/28/19 1939  GLUCAP 95   Lipid Profile: No results for input(s): CHOL, HDL, LDLCALC, TRIG, CHOLHDL, LDLDIRECT in the last 72 hours. Thyroid Function Tests: No results for input(s): TSH, T4TOTAL, FREET4, T3FREE, THYROIDAB in the last 72 hours. Anemia Panel: No results for input(s): VITAMINB12, FOLATE, FERRITIN, TIBC, IRON, RETICCTPCT in the last 72 hours. Sepsis Labs: No results for input(s): PROCALCITON, LATICACIDVEN in the last 168 hours.  Recent Results (from the past 240 hour(s))  Respiratory Panel by RT PCR (Flu A&B, Covid) - Nasopharyngeal Swab     Status: None   Collection Time: 10/28/19  2:41 AM   Specimen: Nasopharyngeal Swab  Result Value Ref Range Status   SARS Coronavirus 2 by RT PCR NEGATIVE NEGATIVE Final    Comment: (NOTE) SARS-CoV-2 target nucleic acids are NOT DETECTED. The SARS-CoV-2 RNA is generally detectable in upper respiratoy specimens during the acute phase of infection. The lowest concentration of SARS-CoV-2 viral copies this assay can detect is 131 copies/mL. A negative result does not preclude SARS-Cov-2 infection and should not be used as the sole basis for treatment or other patient management decisions. A negative result may occur with  improper specimen collection/handling, submission of specimen other than nasopharyngeal swab, presence of viral mutation(s) within the areas targeted by this assay, and inadequate number of viral copies (<131 copies/mL). A negative result must be combined with clinical observations, patient history, and epidemiological information. The expected result is Negative. Fact Sheet for Patients:  PinkCheek.be Fact Sheet for Healthcare Providers:  GravelBags.it This test is not yet ap proved or cleared by the [place] FDA and  has been authorized for detection and/or diagnosis of SARS-CoV-2  by FDA under an Emergency Use Authorization (EUA). This EUA will remain  in effect (meaning this test can be used) for the duration of the COVID-19 declaration under Section 564(b)(1) of the Act, 21 U.S.C. section 360bbb-3(b)(1), unless the authorization is terminated or revoked sooner.    Influenza A by PCR NEGATIVE NEGATIVE Final   Influenza B by PCR NEGATIVE NEGATIVE Final    Comment: (NOTE) The Xpert Xpress SARS-CoV-2/FLU/RSV assay is intended as an aid in  the diagnosis of influenza from Nasopharyngeal swab specimens and  should not be used as a sole basis for treatment. Nasal washings and  aspirates are unacceptable for Xpert Xpress SARS-CoV-2/FLU/RSV  testing. Fact Sheet for Patients: PinkCheek.be Fact Sheet for Healthcare Providers: GravelBags.it This test is not yet approved or cleared by the [place] FDA and  has been authorized for detection and/or diagnosis of SARS-CoV-2 by  FDA under an Emergency Use Authorization (EUA). This EUA will remain  in effect (meaning this test can be used) for the duration of the  Covid-19 declaration under Section 564(b)(1) of the Act, 21  U.S.C. section 360bbb-3(b)(1),  unless the authorization is  terminated or revoked. Performed at [facility], [address] [place], [place] [address]   MRSA PCR Screening     Status: None   Collection Time: 10/28/19  1:37 PM   Specimen: Nasal Mucosa; Nasopharyngeal  Result Value Ref Range Status   MRSA by PCR NEGATIVE NEGATIVE Final    Comment:        The GeneXpert MRSA Assay (FDA approved for NASAL specimens only), is one component of a comprehensive MRSA colonization surveillance program. It is not intended to diagnose MRSA infection nor to guide or monitor treatment for MRSA infections. Performed at [facility], [address] [place], [place] [address]          Radiology Studies: No results  found.      Scheduled Meds: . amLODipine  10 mg Oral Daily  . bisoprolol  10 mg Oral Daily  . Chlorhexidine Gluconate Cloth  6 each Topical Daily  . furosemide  10 mg Oral Daily  . heparin injection (subcutaneous)  5,000 Units Subcutaneous Q8H  . irbesartan  300 mg Oral Daily   Continuous Infusions:    LOS: 3 days    Time spent: 31 mins    Wyvonnia Dusky, MD [facility] Pager [phone]  If 7PM-7AM, please contact night-coverage [email or web address] Password [email or web address] 10/31/2019, 1:08 PM

## 2019-10-31 NOTE — Evaluation (Signed)
Occupational Therapy Evaluation Patient Details Name: Elizabeth [place] MRN: [id number] DOB: 11-Jun-1956 Today's Date: 10/31/2019    History of Present Illness Elizabeth [place] is a 40yoF who [facility] with CP/ABD pain. Pt admitted with type B Aortic Dissection: hx of thoracic AAA, hypertension. Vascular is recommending SBP be maintained <138mmHg.   Clinical Impression   Patient seen for OT evaluation this date, pt lives at home alone in a mobile home and was independent with ADL and IADLs prior to admission including driving and working full time.  She presents with muscle weakness, decreased activity tolerance, decreased functional mobility and transfers which affect her ability to perform necessary daily tasks independently.  She would benefit from skilled OT to maximize safety and independence in ADLs/IADLs following hospital admission to return to her baseline level of function.     Follow Up Recommendations  Home health OT    Equipment Recommendations       Recommendations for Other Services       Precautions / Restrictions Precautions Precautions: Fall Restrictions Weight Bearing Restrictions: No      Mobility Bed Mobility Overal bed mobility: Modified Independent             General bed mobility comments: increased time allowed for task, cues to move to the edge of the bed  Transfers Overall transfer level: Needs assistance Equipment used: None Transfers: Sit to/from Stand Sit to Stand: Supervision              Balance Overall balance assessment: Mild deficits observed, not formally tested;Independent                                         ADL either performed or assessed with clinical judgement   ADL Overall ADL's : Needs assistance/impaired Eating/Feeding: Modified independent   Grooming: Modified independent   Upper Body Bathing: Modified independent   Lower Body Bathing: Set up;Min guard   Upper Body Dressing : Modified  independent   Lower Body Dressing: Set up;Min guard   Toilet Transfer: Min guard           Functional mobility during ADLs: Min guard       Vision Baseline Vision/History: Wears glasses Wears Glasses: Distance only Patient Visual Report: No change from baseline       Perception     Praxis      Pertinent Vitals/Pain Pain Assessment: 0-10 Pain Score: 5  Pain Location: substernal pain Pain Descriptors / Indicators: Aching Pain Intervention(s): Limited activity within patient's tolerance;Monitored during session     Hand Dominance Right   Extremity/Trunk Assessment Upper Extremity Assessment Upper Extremity Assessment: Generalized weakness;Overall [facility] for tasks assessed   Lower Extremity Assessment Lower Extremity Assessment: Defer to PT evaluation       Communication Communication Communication: No difficulties   Cognition Arousal/Alertness: Awake/alert Behavior During Therapy: WFL for tasks assessed/performed Overall Cognitive Status: Within Functional Limits for tasks assessed                                     General Comments       Exercises     Shoulder Instructions      Home Living Family/patient expects to be discharged to:: Private residence Living Arrangements: Alone Available Help at Discharge: Family Type of Home: Mobile home Home Access:  Stairs to enter [employer] of Steps: 4-5 Entrance Stairs-Rails: Can reach both Home Layout: One level     Bathroom Shower/Tub: Corporate investment banker: Standard Bathroom Accessibility: Yes How Accessible: Accessible via walker Home Equipment: [place] - single point   Additional Comments: patient reports she works full time.  She has a sister who lives next door      Prior Functioning/Environment Level of Independence: Independent        Comments: Drives        OT Problem List: Decreased strength;Decreased range of motion;Decreased activity  tolerance;Impaired balance (sitting and/or standing);Pain      OT Treatment/Interventions: Self-care/ADL training;Therapeutic exercise;Patient/family education;Balance training;Therapeutic activities;DME and/or AE instruction    OT Goals(Current goals can be found in the care plan section) Acute Rehab OT Goals Patient Stated Goal: to get back home again and be independent OT Goal Formulation: With patient Time For Goal Achievement: 11/07/19 Potential to Achieve Goals: Good  OT Frequency: Min 1X/week   Barriers to D/C:            Co-evaluation              AM-PAC OT "6 Clicks" Daily Activity     Outcome Measure Help from another person eating meals?: None Help from another person taking care of personal grooming?: None Help from another person toileting, which includes using toliet, bedpan, or urinal?: A Little Help from another person bathing (including washing, rinsing, drying)?: A Little Help from another person to put on and taking off regular upper body clothing?: None Help from another person to put on and taking off regular lower body clothing?: A Little 6 Click Score: 21   End of Session Equipment Utilized During Treatment: Gait belt  Activity Tolerance: Patient tolerated treatment well Patient left: in bed;with call bell/phone within reach  OT Visit Diagnosis: Muscle weakness (generalized) (M62.81)                Time: [id number] OT Time Calculation (min): 23 min Charges:  OT General Charges $OT Visit: 1 Visit OT Evaluation $OT Eval Low Complexity: 1 Low  Elizabeth [place], OTR/L, CLT   Elizabeth [place] 10/31/2019, 2:42 PM

## 2019-10-31 NOTE — Progress Notes (Signed)
[place], [place] 10/31/19  Subjective:   Hospital day # 3  Overall feels fair.  Denies any acute complaints except discomfort over the chest  Renal: 01/15 0701 - 01/16 0700 In: 480 [P.O.:480] Out: -  Lab Results  Component Value Date   CREATININE 3.23 (H) 10/31/2019   CREATININE 3.15 (H) 10/30/2019   CREATININE 2.88 (H) 10/29/2019     Objective:  Vital signs in last 24 hours:  Temp:  [98.2 F (36.8 C)-99.4 F (37.4 C)] 98.9 F (37.2 C) (01/16 0423) Pulse Rate:  [60-70] 67 (01/16 0423) Resp:  [18-22] 18 (01/16 0423) BP: (111-141)/(65-84) 127/77 (01/16 0423) SpO2:  [98 %-100 %] 98 % (01/16 0423)  Weight change:  Filed Weights   10/27/19 1934 10/29/19 0500 10/29/19 1949  Weight: 68 kg 66.9 kg 67.6 kg    Intake/Output:    Intake/Output Summary (Last 24 hours) at 10/31/2019 0849 Last data filed at 10/30/2019 1851 Gross per 24 hour  Intake 480 ml  Output --  Net 480 ml     Physical Exam: General:  Laying in the bed, no acute distress  HEENT  anicteric, moist oral mucous membranes  Pulm/lungs Room air, clear to auscultation  CVS/Heart  systolic murmur, no rub  Abdomen:   Soft, nontender  Extremities:  No peripheral edema  Neurologic:  Alert, oriented  Skin:  No acute rashes    Basic Metabolic Panel:  Recent Labs  Lab 10/27/19 1939 10/27/19 1939 10/28/19 0455 10/28/19 0455 10/29/19 0551 10/30/19 0839 10/31/19 0441  NA 138  --  139  --  142 139 140  K 5.1  --  5.1  --  4.8 4.6 4.5  CL 109  --  113*  --  116* 112* 112*  CO2 20*  --  20*  --  19* 18* 18*  GLUCOSE 125*  --  106*  --  77 82 79  BUN 46*  --  43*  --  34* 32* 39*  CREATININE 3.14*  --  2.73*  --  2.88* 3.15* 3.23*  CALCIUM 10.1   < > 9.5   < > 9.8 10.2 10.0   < > = values in this interval not displayed.     CBC: Recent Labs  Lab 10/27/19 2010 10/28/19 0455 10/29/19 0551 10/30/19 0839 10/31/19 0441  WBC 8.6 8.9 6.9 11.2* 10.8*  HGB 9.2* 8.8*  9.2* 10.2* 9.4*  HCT 30.2* 28.8* 30.4* 32.6* 30.6*  MCV 91.5 92.6 91.6 89.6 89.7  PLT 275 271 268 325 269     No results found for: HEPBSAG, HEPBSAB, HEPBIGM    Microbiology:  Recent Results (from the past 240 hour(s))  Respiratory Panel by RT PCR (Flu A&B, Covid) - Nasopharyngeal Swab     Status: None   Collection Time: 10/28/19  2:41 AM   Specimen: Nasopharyngeal Swab  Result Value Ref Range Status   SARS Coronavirus 2 by RT PCR NEGATIVE NEGATIVE Final    Comment: (NOTE) SARS-CoV-2 target nucleic acids are NOT DETECTED. The SARS-CoV-2 RNA is generally detectable in upper respiratoy specimens during the acute phase of infection. The lowest concentration of SARS-CoV-2 viral copies this assay can detect is 131 copies/mL. A negative result does not preclude SARS-Cov-2 infection and should not be used as the sole basis for treatment or other patient management decisions. A negative result may occur with  improper specimen collection/handling, submission of specimen other than nasopharyngeal swab, presence of viral mutation(s) within the areas targeted by this  assay, and inadequate number of viral copies (<131 copies/mL). A negative result must be combined with clinical observations, patient history, and epidemiological information. The expected result is Negative. Fact Sheet for Patients:  PinkCheek.be Fact Sheet for Healthcare Providers:  GravelBags.it This test is not yet ap proved or cleared by the [place] FDA and  has been authorized for detection and/or diagnosis of SARS-CoV-2 by FDA under an Emergency Use Authorization (EUA). This EUA will remain  in effect (meaning this test can be used) for the duration of the COVID-19 declaration under Section 564(b)(1) of the Act, 21 U.S.C. section 360bbb-3(b)(1), unless the authorization is terminated or revoked sooner.    Influenza A by PCR NEGATIVE NEGATIVE Final    Influenza B by PCR NEGATIVE NEGATIVE Final    Comment: (NOTE) The Xpert Xpress SARS-CoV-2/FLU/RSV assay is intended as an aid in  the diagnosis of influenza from Nasopharyngeal swab specimens and  should not be used as a sole basis for treatment. Nasal washings and  aspirates are unacceptable for Xpert Xpress SARS-CoV-2/FLU/RSV  testing. Fact Sheet for Patients: PinkCheek.be Fact Sheet for Healthcare Providers: GravelBags.it This test is not yet approved or cleared by the [place] FDA and  has been authorized for detection and/or diagnosis of SARS-CoV-2 by  FDA under an Emergency Use Authorization (EUA). This EUA will remain  in effect (meaning this test can be used) for the duration of the  Covid-19 declaration under Section 564(b)(1) of the Act, 21  U.S.C. section 360bbb-3(b)(1), unless the authorization is  terminated or revoked. Performed at [facility], [address] [place], [place] [address]   MRSA PCR Screening     Status: None   Collection Time: 10/28/19  1:37 PM   Specimen: Nasal Mucosa; Nasopharyngeal  Result Value Ref Range Status   MRSA by PCR NEGATIVE NEGATIVE Final    Comment:        The GeneXpert MRSA Assay (FDA approved for NASAL specimens only), is one component of a comprehensive MRSA colonization surveillance program. It is not intended to diagnose MRSA infection nor to guide or monitor treatment for MRSA infections. Performed at [facility], [address] [place], [place] [address]     Coagulation Studies: No results for input(s): LABPROT, INR in the last 72 hours.  Urinalysis: No results for input(s): COLORURINE, LABSPEC, PHURINE, GLUCOSEU, HGBUR, BILIRUBINUR, KETONESUR, PROTEINUR, UROBILINOGEN, NITRITE, LEUKOCYTESUR in the last 72 hours.  Invalid input(s): APPERANCEUR    Imaging: No results found.   Medications:    . amLODipine  10 mg Oral Daily  .  bisoprolol  10 mg Oral Daily  . Chlorhexidine Gluconate Cloth  6 each Topical Daily  . furosemide  10 mg Oral Daily  . heparin injection (subcutaneous)  5,000 Units Subcutaneous Q8H  . irbesartan  300 mg Oral Daily   acetaminophen, hydrALAZINE, HYDROmorphone (DILAUDID) injection, ondansetron (ZOFRAN) IV, oxyCODONE-acetaminophen  Assessment/ Plan:  64 y.o. female with hypertension osteoarthritis primary hyperparathyroidism, thoracoabdominal aortic aneurysm with Stanford type B dissection admitted on 10/27/2019 for Aortic dissection (HCC) [I71.00] Epigastric pain [R10.13] Dissection of thoracic aorta (HCC) [I71.01]   #Acute renal failure Acute kidney injury likely due to cardiorenal syndrome and also IV contrast exposure on January 13 Serum creatinine still increasing to 3.23, but rate of rise of creatinine has slowed Patient is nonoliguric.  No acute indication for dialysis at present  #Chronic kidney disease stage IV.  Baseline creatinine 2.73/GFR 20 from Jan 2021 at admission Underlying CKD likely secondary to  hypertension and atherosclerotic disease Patient may establish a new baseline  #Primary hyperparathyroidism Followed at [facility] clinic endocrinology Bone scan being planned to evaluate for parathyroidectomy Current calcium level at 10  #Type B aortic dissection Dissection of upper abdominal aorta originating distal to the left subclavian artery terminating near the left main renal artery origin Patient is followed by [facility] vascular surgery Dr. Sammuel Hines, last seen in October 2020 Blood pressure control is acceptable at present.  Goal less than 140/70, preferred closer to 120/60 as per vascular recommendations   LOS: [place] 1/16/20218:49 AM  [place], [place]  Note: This note was prepared with Dragon dictation. Any transcription errors are unintentional

## 2019-10-31 NOTE — TOC Initial Note (Signed)
Transition of Care [facility]) - Initial/Assessment Note    Patient Details  Name: Elizabeth Roy MRN: [id number] Date of Birth: Jan 30, 1956  Transition of [facility]) CM/SW Contact:    Shelbie Ammons, RN Phone Number: 10/31/2019, 10:34 AM  Clinical Narrative:    RNCM placed call to patient to assess due to referral for home health services. Patient was admitted to hospital initially for epigastric pain however then found to have dissecting aortic aneurysm and subsequently spent time in the ICU. Patient initially verbalizing that she is not sure she needs PT as she feels she is about where she was before coming to the hospital but then decided she was agreeable to them coming out short term. Patient lives in single level home alone and still drives. She reports she is seen at [facility] and gets her medications either from [facility] or mail order. Patient denies any other needs at this time. RNCM placed call to Sharmon Revere with [facility] who will accept referral. RNCM will continue to follow for any concerns.       Expected Discharge Plan: [place] Barriers to Discharge: Continued Medical Work up   Patient Goals and CMS Choice Patient states their goals for this hospitalization and ongoing recovery are:: to get everything straightened out so I can go home      Expected Discharge Plan and Services Expected Discharge Plan: [place]   Discharge Planning Services: CM Consult Post Acute Care Choice: [place] arrangements for the past 2 months: Single Family Home                                      Prior Living Arrangements/Services Living arrangements for the past 2 months: Single Family Home Lives with:: Self Patient language and need for interpreter reviewed:: Yes Do you feel safe going back to the place where you live?: Yes      Need for Family Participation in Patient Care: No (Comment) Care giver support system in place?:  No (comment)   Criminal Activity/Legal Involvement Pertinent to Current Situation/Hospitalization: No - Comment as needed  Activities of Daily Living Home Assistive Devices/Equipment: None ADL Screening (condition at time of admission) Patient's cognitive ability adequate to safely complete daily activities?: Yes Is the patient deaf or have difficulty hearing?: No Does the patient have difficulty seeing, even when wearing glasses/contacts?: No Does the patient have difficulty concentrating, remembering, or making decisions?: No Patient able to express need for assistance with ADLs?: Yes Does the patient have difficulty dressing or bathing?: No Independently performs ADLs?: Yes (appropriate for developmental age) Does the patient have difficulty walking or climbing stairs?: No Weakness of Legs: None Weakness of Arms/Hands: None  Permission Sought/Granted                  Emotional Assessment Appearance:: (Patient assessed by phone) Attitude/Demeanor/Rapport: Engaged   Orientation: : Oriented to Self, Oriented to Place, Oriented to  Time, Oriented to Situation Alcohol / Substance Use: Never Used Psych Involvement: No (comment)  Admission diagnosis:  Aortic dissection (HCC) [I71.00] Epigastric pain [R10.13] Dissection of thoracic aorta ([place]) [I71.01] Patient Active Problem List   Diagnosis Date Noted  . CKD (chronic kidney disease), stage IV ([place])   . Aortic dissection-type B 10/28/2019  . CKD (chronic kidney disease), stage IIIb 10/28/2019  . Normocytic anemia 10/28/2019  . Encounter for  general adult medical examination with abnormal findings 09/13/2019  . Impaired fasting glucose 05/24/2019  . Allergic conjunctivitis of both eyes 05/24/2019  . Vulvar furuncle 03/17/2019  . Screening for breast cancer 09/03/2018  . Screening for malignant neoplasm of cervix 09/03/2018  . Dysuria 09/03/2018  . Iron deficiency anemia 05/19/2018  . Tobacco use disorder 05/02/2018  .  Thoracoabdominal aneurysm ([place]) 05/02/2018  . Renal disorder 05/02/2018  . Carotid artery disease ([place]) 02/21/2018  . Thoracic aortic aneurysm without rupture ([place]) 02/21/2018  . Vitamin D deficiency 02/21/2018  . Swelling of ankle joint 12/10/2017  . Other specified noninflammatory disorders of vagina 10/16/2017  . Viral wart 10/16/2017  . Abdominal aortic aneurysm without rupture ([place]) 10/16/2017  . Constipation 10/16/2017  . Pain in left shoulder 10/16/2017  . Acute upper respiratory infection 10/16/2017  . Vaginal candidiasis 10/16/2017  . Unspecified asthma, uncomplicated [id number]  . Vasomotor rhinitis 10/16/2017  . Mixed hyperlipidemia 10/16/2017  . SOB (shortness of breath) 10/16/2017  . Essential hypertension 10/16/2017   PCP:  Ronnell Freshwater, NP Pharmacy:   [id number] - [place], [address] [place] [address] Phone: [phone] Fax: [phone]  [place], [place] [address] Phone: [phone] Fax: [phone]     Social Determinants of Health ([place]) Interventions    Readmission Risk Interventions No flowsheet data found.

## 2019-11-01 LAB — BASIC METABOLIC PANEL
Anion gap: 9 (ref 5–15)
BUN: 42 mg/dL — ABNORMAL HIGH (ref 8–23)
CO2: 19 mmol/L — ABNORMAL LOW (ref 22–32)
Calcium: 9.8 mg/dL (ref 8.9–10.3)
Chloride: 109 mmol/L (ref 98–111)
Creatinine, Ser: 3.49 mg/dL — ABNORMAL HIGH (ref 0.44–1.00)
GFR calc Af Amer: 15 mL/min — ABNORMAL LOW (ref >60)
GFR calc non Af Amer: 13 mL/min — ABNORMAL LOW (ref >60)
Glucose, Bld: 84 mg/dL (ref 70–99)
Potassium: 4.5 mmol/L (ref 3.5–5.1)
Sodium: 137 mmol/L (ref 135–145)

## 2019-11-01 LAB — CBC
HCT: 30.8 % — ABNORMAL LOW (ref 36.0–46.0)
Hemoglobin: 9.4 g/dL — ABNORMAL LOW (ref 12.0–15.0)
MCH: 27.4 pg (ref 26.0–34.0)
MCHC: 30.5 g/dL (ref 30.0–36.0)
MCV: 89.8 fL (ref 80.0–100.0)
Platelets: 274 10*3/uL (ref 150–400)
RBC: 3.43 MIL/uL — ABNORMAL LOW (ref 3.87–5.11)
RDW: 13.8 % (ref 11.5–15.5)
WBC: 9.8 10*3/uL (ref 4.0–10.5)
nRBC: 0 % (ref 0.0–0.2)

## 2019-11-01 MED ORDER — IRBESARTAN 300 MG PO TABS: 300.0000 mg | ORAL_TABLET | Freq: Every day | ORAL | 0 refills | Status: DC

## 2019-11-01 MED ORDER — FUROSEMIDE 20 MG PO TABS: 10.0000 mg | ORAL_TABLET | Freq: Every day | ORAL | 0 refills | Status: DC

## 2019-11-01 MED ORDER — BISOPROLOL FUMARATE 10 MG PO TABS: 10.0000 mg | ORAL_TABLET | Freq: Every day | ORAL | 0 refills | Status: DC

## 2019-11-01 MED ORDER — OXYCODONE-ACETAMINOPHEN 5-325 MG PO TABS: 1.0000 | ORAL_TABLET | Freq: Three times a day (TID) | ORAL | 0 refills | Status: AC | PRN

## 2019-11-01 NOTE — Discharge Summary (Signed)
Physician Discharge Summary  MANDEE GINN [id number] DOB: 17-Sep-1956 DOA: 10/27/2019  PCP: Ronnell Freshwater, NP  Admit date: 10/27/2019 Discharge date: 11/01/2019  Admitted From: home  Disposition: home w/ home health   Recommendations for Outpatient Follow-up:  1. Follow up with PCP in 1-2 weeks 2. F/u vasc surg (Dr. Sammuel Hines) in 1-2 days 3. F/u nephro in 1 week  Home Health: yes  Equipment/Devices:   Discharge Condition:Stable  CODE STATUS: full  Diet recommendation: Heart Healthy   Brief/Interim Summary: HPI was taken from NP Dewaine Conger: Mrs. Ungerman is a 64 year old female with a past medical history notable for thoracic AAA, smoking, hypertension who presents to [facility] ED on 10/27/2019 with complaints of abdominal pain.  She reports the abdominal pain started shortly today after she finished eating when she initially thought it might be indigestion.  However the pain was more intense and persisted longer than she expected.  The pain was located across her upper abdomen with radiation toward the back.  She denies any chest pain, shortness of breath, pulsatile abdominal mass, nausea, vomiting, diarrhea, fever, chills, sick contacts.  Initial work-up in the ED revealed hemoglobin 9.2, bicarb 20, creatinine 3.14, BUN 46, glucose 125, and normal LFTs.  Abdominal ultrasound negative for gall stones.  Given concern for possible dissection, CT chest, abdomen, and pelvis without contrast were obtained which revealed type II aortic dissection, but was unable to see the ascending aorta as well without contrast.  Vascular surgery was consulted to assess whether contrast could be given due to AKI.  Vascular surgery recommended giving contrast to rule out type A dissection, in case need for transfer for for surgical repair.  CTA chest, abdomen and pelvis confirmed type B aortic dissection.  Vascular surgery recommended admission to ICU with medical management including esmolol and nicardipine drips for strict  heart rate and blood pressure control.  PCCM is consulted for admission.  Hospital Course from Dr. Lenise Herald 1/14-1/17/21: Pt was treated medically for type B aortic dissection as per vascular surg recommendation. Pt was treated w/ amlodipine, irbesartan, bisoprolol, & lasix as per nephro. Goal BP is <120/80. Pt will f/u w/ Dr. Sammuel Hines (vas surg) outpatient ASAP to likely schedule repair surgery. Pt verbalized her understanding of this. Pt was given a work note so that she is excused from work until she able to see Dr. Sammuel Hines and he can clear her for work whenever he feels she is clinically appropriate to do so. Pt verbalized her understanding of this as well. Also, PT saw the pt and recommend home health. Home health was set up by CM.   Discharge Diagnoses:  Principal Problem:   Aortic dissection-type B Active Problems:   Abdominal aortic aneurysm without rupture (HCC)   Essential hypertension   Tobacco use disorder   Iron deficiency anemia   CKD (chronic kidney disease), stage IIIb   Normocytic anemia   CKD (chronic kidney disease), stage IV (HCC)  Type B Aortic Dissection: hx ofthoracic AAA, hypertension. D/c esmolol & nicardipine drip. Continue on amlodipine, irbesartan, bisoprolol & lasix as per nephro. Keep BP <120/80. Nephro recs apprec. Vascular surg recommends medical management and referral back to Dr. Sammuel Hines w/ new dissection will most likely need a repair. Pt verbalized her understanding. Vasc surg recs apprec. Dilaudid & oxycodone prn for pain.   HTN: uncontrolled. Keep BP <120/80. Continue on amlodipine, irbesartan, bisoprolol & lasix as per nephro.  AKI on CKD [id number] creatinine 2.88/GFR 19. Cr is trending up today. Management as  per nephro   Normocytic anemia: H&H are stable. No need for a transfusion at this time. Will continue to monitor   Generalized weakness: PT recs home health. Home health orders place  Leukocytosis: likely reactive. Will continue to  monitor   Discharge Instructions  Discharge Instructions    Diet - low sodium heart healthy   Complete by: As directed    Discharge instructions   Complete by: As directed    F/U Dr. Sammuel Hines (vascular surg) in 2-3 days; F/u nephro in 1 week; F/u PCP in 1-2 weeks   Increase activity slowly   Complete by: As directed      Allergies as of 11/01/2019   No Known Allergies     Medication List    STOP taking these medications   olmesartan 40 MG tablet Commonly known as: BENICAR     TAKE these medications   amLODipine 10 MG tablet Commonly known as: NORVASC Take 1 tablet (10 mg total) by mouth daily.   bisoprolol 10 MG tablet Commonly known as: ZEBETA Take 1 tablet (10 mg total) by mouth daily. Start taking on: November 02, 2019 What changed:   medication strength  how much to take  when to take this   furosemide 20 MG tablet Commonly known as: LASIX Take 0.5 tablets (10 mg total) by mouth daily. Start taking on: November 02, 2019   irbesartan 300 MG tablet Commonly known as: AVAPRO Take 1 tablet (300 mg total) by mouth daily. Start taking on: November 02, 2019   Olopatadine HCl 0.2 % Soln Place 1 drop into both eyes daily.   oxyCODONE-acetaminophen 5-325 MG tablet Commonly known as: PERCOCET/ROXICET Take 1 tablet by mouth every 8 (eight) hours as needed for up to 5 days for moderate pain or severe pain.      Follow-up Information    Thomasene Ripple, MD. Call.   Specialty: Vascular Surgery Why: Please call Dr. Rosalee Kaufman office to make an appointment to be seen ASAP to discuss the new findings on your recent CTA.  Contact information: [address] [place] [address] [phone]          No Known Allergies  Consultations:  Vascular surgery, nephro    Procedures/Studies: CT ABDOMEN PELVIS WO CONTRAST  Addendum Date: 10/28/2019   ADDENDUM REPORT: 10/28/2019 02:43 ADDENDUM: Critical Value/emergent results were called by telephone at  the time of interpretation on 10/28/2019 at 0230 hours to Dr. Rudene Re , who verbally acknowledged these results. Electronically Signed   By: Genevie Ann M.D.   On: 10/28/2019 02:43   Result Date: 10/28/2019 CLINICAL DATA:  64 year old female with known thoracoabdominal aortic aneurysm and chest pain radiating to the back. Creatinine greater than 3 such that IV contrast is deferred at this time. EXAM: CT CHEST, ABDOMEN AND PELVIS WITHOUT CONTRAST TECHNIQUE: Multidetector CT imaging of the chest, abdomen and pelvis was performed following the standard protocol without IV contrast. COMPARISON:  Prior noncontrast chest and abdominal/pelvic CTs 05/21/2018 and earlier. No prior contrast enhanced CTs in our system since 1997. FINDINGS: CT CHEST FINDINGS Cardiovascular: Chronic fusiform enlargement of the thoracic aorta, caliber in the arch appears increased since 2019 from approximately 34 millimeters at that time to 40 millimeters now. Furthermore, with narrow CT windows a dissection flap is visible in the arch and descending aorta (series 2, image 23). Even with narrow windows no flap is evident in the ascending aorta or proximal to the great vessel origins. Aneurysmal caliber of the  descending thoracic aorta appears not significantly changed from 2019 up to 53 millimeters (series 2, image 35). Stable mild cardiomegaly with no pericardial effusion. Calcified coronary artery atherosclerosis. No periaortic hematoma is evident. Mediastinum/Nodes: Negative.  No lymphadenopathy. Lungs/Pleura: Major airways are patent. There is increased left lower lobe pulmonary opacity since 2018 that most resembles atelectasis. No definite pleural effusion. No other acute pulmonary opacity. Musculoskeletal: No acute osseous abnormality identified. CT ABDOMEN PELVIS FINDINGS Hepatobiliary: Negative noncontrast liver and gallbladder. Pancreas: Negative. Spleen: Negative. Adrenals/Urinary Tract: Normal adrenal glands. Chronic renal  cysts appear stable since 2019. No hydronephrosis. Mildly distended but otherwise unremarkable urinary bladder. Stomach/Bowel: No dilated large or small bowel in the abdomen. Mild motion artifact in the lower abdomen. No free air or free fluid. Vascular/Lymphatic: The dissection flap visible in the descending thoracic aorta on these noncontrast images cannot be delineated in the abdomen. Fusiform aneurysmal enlargement of the aorta at the level of the diaphragm has mildly increased since 2019 from 52-54 millimeters. Ectasia of the remaining abdominal aorta appears stable. Superimposed Aortoiliac calcified atherosclerosis. No lymphadenopathy. Reproductive: Negative noncontrast appearance. Other: No pelvic free fluid. Musculoskeletal: No acute osseous abnormality identified. IMPRESSION: 1. This study is positive for Thoracic Aortic Dissection despite the absence of IV contrast. A new dissection flap since 2019 is evident in the arch and the descending thoracic aorta. No flap is evident in the ascending aorta, favoring a Stanford type B dissection. The distal extent of the flap is unclear, but the abdominal aorta might be spared. 2. Subsequent increased fusiform aneurysmal enlargement of the arch since 2019 from 34 mm then to 40 mm now. But the larger descending and thoracoabdominal aortic aneurysmal segments appear stable since 2019 approximately 52-54 mm diameter. 3. No evidence of aortic rupture on this non-contrast exam. 4. Recommend Vascular Surgery Consultation to determine the next best diagnostic and treatment steps. Electronically Signed: By: Genevie Ann M.D. On: 10/28/2019 02:25   DG Chest 2 View  Result Date: 10/27/2019 CLINICAL DATA:  Chest pain EXAM: CHEST - 2 VIEW COMPARISON:  CT 11/18/2014 FINDINGS: Heart is enlarged. Tortuous, diffusely aneurysmal thoracic aorta. Lungs clear. No effusions. No acute bony abnormality. IMPRESSION: No acute cardiopulmonary disease. Cardiomegaly, diffusely aneurysmal  thoracic aorta. This is similar to prior CT. Electronically Signed   By: Rolm Baptise M.D.   On: 10/27/2019 20:04   CT Chest Wo Contrast  Addendum Date: 10/28/2019   ADDENDUM REPORT: 10/28/2019 02:43 ADDENDUM: Critical Value/emergent results were called by telephone at the time of interpretation on 10/28/2019 at 0230 hours to Dr. Rudene Re , who verbally acknowledged these results. Electronically Signed   By: Genevie Ann M.D.   On: 10/28/2019 02:43   Result Date: 10/28/2019 CLINICAL DATA:  64 year old female with known thoracoabdominal aortic aneurysm and chest pain radiating to the back. Creatinine greater than 3 such that IV contrast is deferred at this time. EXAM: CT CHEST, ABDOMEN AND PELVIS WITHOUT CONTRAST TECHNIQUE: Multidetector CT imaging of the chest, abdomen and pelvis was performed following the standard protocol without IV contrast. COMPARISON:  Prior noncontrast chest and abdominal/pelvic CTs 05/21/2018 and earlier. No prior contrast enhanced CTs in our system since 1997. FINDINGS: CT CHEST FINDINGS Cardiovascular: Chronic fusiform enlargement of the thoracic aorta, caliber in the arch appears increased since 2019 from approximately 34 millimeters at that time to 40 millimeters now. Furthermore, with narrow CT windows a dissection flap is visible in the arch and descending aorta (series 2, image 23). Even with narrow windows no  flap is evident in the ascending aorta or proximal to the great vessel origins. Aneurysmal caliber of the descending thoracic aorta appears not significantly changed from 2019 up to 53 millimeters (series 2, image 35). Stable mild cardiomegaly with no pericardial effusion. Calcified coronary artery atherosclerosis. No periaortic hematoma is evident. Mediastinum/Nodes: Negative.  No lymphadenopathy. Lungs/Pleura: Major airways are patent. There is increased left lower lobe pulmonary opacity since 2018 that most resembles atelectasis. No definite pleural effusion. No  other acute pulmonary opacity. Musculoskeletal: No acute osseous abnormality identified. CT ABDOMEN PELVIS FINDINGS Hepatobiliary: Negative noncontrast liver and gallbladder. Pancreas: Negative. Spleen: Negative. Adrenals/Urinary Tract: Normal adrenal glands. Chronic renal cysts appear stable since 2019. No hydronephrosis. Mildly distended but otherwise unremarkable urinary bladder. Stomach/Bowel: No dilated large or small bowel in the abdomen. Mild motion artifact in the lower abdomen. No free air or free fluid. Vascular/Lymphatic: The dissection flap visible in the descending thoracic aorta on these noncontrast images cannot be delineated in the abdomen. Fusiform aneurysmal enlargement of the aorta at the level of the diaphragm has mildly increased since 2019 from 52-54 millimeters. Ectasia of the remaining abdominal aorta appears stable. Superimposed Aortoiliac calcified atherosclerosis. No lymphadenopathy. Reproductive: Negative noncontrast appearance. Other: No pelvic free fluid. Musculoskeletal: No acute osseous abnormality identified. IMPRESSION: 1. This study is positive for Thoracic Aortic Dissection despite the absence of IV contrast. A new dissection flap since 2019 is evident in the arch and the descending thoracic aorta. No flap is evident in the ascending aorta, favoring a Stanford type B dissection. The distal extent of the flap is unclear, but the abdominal aorta might be spared. 2. Subsequent increased fusiform aneurysmal enlargement of the arch since 2019 from 34 mm then to 40 mm now. But the larger descending and thoracoabdominal aortic aneurysmal segments appear stable since 2019 approximately 52-54 mm diameter. 3. No evidence of aortic rupture on this non-contrast exam. 4. Recommend Vascular Surgery Consultation to determine the next best diagnostic and treatment steps. Electronically Signed: By: Genevie Ann M.D. On: 10/28/2019 02:25   CT Angio Chest/Abd/Pel for Dissection W and/or Wo  Contrast  Addendum Date: 10/28/2019   ADDENDUM REPORT: 10/28/2019 03:50 ADDENDUM: Study discussed by telephone with Dr. Rudene Re on 10/28/2019 at [facility]. Electronically Signed   By: Genevie Ann M.D.   On: 10/28/2019 03:50   Result Date: 10/28/2019 CLINICAL DATA:  64 year old female with history of chronic kidney disease and chronic thoracoabdominal aneurysm. Acute pain radiating to the back, and evidence of new dissection since 2019 in the thoracic aorta on noncontrast exam tonight. EXAM: CT ANGIOGRAPHY CHEST, ABDOMEN AND PELVIS TECHNIQUE: Multidetector CT imaging through the chest, abdomen and pelvis was performed using the standard protocol during bolus administration of intravenous contrast. Multiplanar reconstructed images and MIPs were obtained and reviewed to evaluate the vascular anatomy. CONTRAST:  172mL OMNIPAQUE IOHEXOL 350 MG/ML SOLN COMPARISON:  Noncontrast CT Chest, Abdomen, and Pelvis earlier tonight. FINDINGS: CTA CHEST FINDINGS Cardiovascular: Good contrast timing in the aorta. These images confirm a Stanford type B aortic dissection which begins immediately posterior to the left subclavian artery origin on series 4, image 21. The dissection flap continues through the descending aorta and thoracoabdominal aortic aneurysm into the proximal abdominal aorta as detailed below. No periaortic hematoma. Stable cardiac size. No pericardial effusion. There is a trace left pleural effusion evident with the addition of IV contrast, but stable. Proximal great vessels appear patent without dissection. Central pulmonary arteries also appear patent. Otherwise stable CT appearance of the  chest from 0159 hours today. Review of the MIP images confirms the above findings. CTA ABDOMEN AND PELVIS FINDINGS VASCULAR The dissection continues into the upper abdominal aorta and the true lumen is in the midline with late or nonenhancing false lumen along both lateral walls (series 4, image 88). The celiac arises  from the true lumen. The SMA arises from the true lumen. The dissection then terminates near the left main renal artery origin (series 4, images 96-98). Both main renal arteries are enhancing. The infrarenal and iliofemoral arteries are patent and stable since 2019 with calcified atherosclerosis. No periaortic hematoma. Review of the MIP images confirms the above findings. NON-VASCULAR Satisfactory appearance of liver, spleen, pancreas and adrenal gland enhancement. Symmetric appearing renal enhancement with superimposed chronic renal cysts. Otherwise stable CT appearance of the abdomen and pelvis from 0159 hours today. Review of the MIP images confirms the above findings. IMPRESSION: 1. Confirmed Stanford type B Aortic Dissection which begins just distal to the left subclavian artery origin, continues into the proximal abdominal aorta, and terminates at the origin of the left renal artery. 2. Celiac, SMA, and main renal arteries arise from the true lumen. No periaortic hematoma. Underlying thoracoabdominal aortic aneurysm as detailed earlier tonight. 3. Otherwise stable CT appearance of the chest, abdomen, and pelvis from the 0159 hour study today; there is a trace left pleural effusion. Electronically Signed: By: Genevie Ann M.D. On: 10/28/2019 03:28   US Abdomen Limited RUQ  Result Date: 10/28/2019 CLINICAL DATA:  Epigastric pain EXAM: ULTRASOUND ABDOMEN LIMITED RIGHT UPPER QUADRANT COMPARISON:  CT May 21, 2018 FINDINGS: Gallbladder: No gallstones or wall thickening visualized. No sonographic Murphy sign noted by sonographer. Common bile duct: Diameter: 2.8 mm Liver: No focal lesion identified. Within normal limits in parenchymal echogenicity. Portal vein is patent on color Doppler imaging with normal direction of blood flow towards the liver. Other: There is incidental note made of a proximal aortic aneurysm measuring up to 4.8 cm, with internal echogenicity which could be calcified or noncalcified  atherosclerosis. IMPRESSION: 1. Normal liver and gallbladder. 2. Proximal aortic aneurysm measuring 4.8 cm, grossly unchanged given differences in technique. Recommend followup by abdomen and pelvis CTA in 6 months. This recommendation follows ACR consensus guidelines: White Paper of the ACR Incidental Findings Committee II on Vascular Findings. J Am Coll Radiol 2013; 10:789-794. Aortic aneurysm NOS (ICD10-I71.9) Electronically Signed   By: Prudencio Pair M.D.   On: 10/28/2019 01:03        Subjective: Pt c/o intermittent shoulder pain    Discharge Exam: Vitals:   10/31/19 2222 11/01/19 0530  BP: 119/74 115/68  Pulse: 61 62  Resp: 18 20  Temp: 98.3 F (36.8 C) 99.5 F (37.5 C)  SpO2: 97% 98%   Vitals:   10/31/19 0423 10/31/19 1251 10/31/19 2222 11/01/19 0530  BP: 127/77 119/80 119/74 115/68  Pulse: 67 (!) 53 61 62  Resp: 18 19 18 20   Temp: 98.9 F (37.2 C) (!) 97.4 F (36.3 C) 98.3 F (36.8 C) 99.5 F (37.5 C)  TempSrc: Oral Oral Oral Oral  SpO2: 98% 99% 97% 98%  Weight:      Height:        General: Pt is alert, awake, not in acute distress Cardiovascular: S1/S2 +, no rubs, no gallops Respiratory: CTA bilaterally, no wheezing, no rhonchi Abdominal: Soft, NT, ND, bowel sounds + Extremities: no edema, no cyanosis    The results of significant diagnostics from this hospitalization (including imaging, microbiology, ancillary and  laboratory) are listed below for reference.     Microbiology: Recent Results (from the past 240 hour(s))  Respiratory Panel by RT PCR (Flu A&B, Covid) - Nasopharyngeal Swab     Status: None   Collection Time: 10/28/19  2:41 AM   Specimen: Nasopharyngeal Swab  Result Value Ref Range Status   SARS Coronavirus 2 by RT PCR NEGATIVE NEGATIVE Final    Comment: (NOTE) SARS-CoV-2 target nucleic acids are NOT DETECTED. The SARS-CoV-2 RNA is generally detectable in upper respiratoy specimens during the acute phase of infection. The  lowest concentration of SARS-CoV-2 viral copies this assay can detect is 131 copies/mL. A negative result does not preclude SARS-Cov-2 infection and should not be used as the sole basis for treatment or other patient management decisions. A negative result may occur with  improper specimen collection/handling, submission of specimen other than nasopharyngeal swab, presence of viral mutation(s) within the areas targeted by this assay, and inadequate number of viral copies (<131 copies/mL). A negative result must be combined with clinical observations, patient history, and epidemiological information. The expected result is Negative. Fact Sheet for Patients:  PinkCheek.be Fact Sheet for Healthcare Providers:  GravelBags.it This test is not yet ap proved or cleared by the [place] FDA and  has been authorized for detection and/or diagnosis of SARS-CoV-2 by FDA under an Emergency Use Authorization (EUA). This EUA will remain  in effect (meaning this test can be used) for the duration of the COVID-19 declaration under Section 564(b)(1) of the Act, 21 U.S.C. section 360bbb-3(b)(1), unless the authorization is terminated or revoked sooner.    Influenza A by PCR NEGATIVE NEGATIVE Final   Influenza B by PCR NEGATIVE NEGATIVE Final    Comment: (NOTE) The Xpert Xpress SARS-CoV-2/FLU/RSV assay is intended as an aid in  the diagnosis of influenza from Nasopharyngeal swab specimens and  should not be used as a sole basis for treatment. Nasal washings and  aspirates are unacceptable for Xpert Xpress SARS-CoV-2/FLU/RSV  testing. Fact Sheet for Patients: PinkCheek.be Fact Sheet for Healthcare Providers: GravelBags.it This test is not yet approved or cleared by the [place] FDA and  has been authorized for detection and/or diagnosis of SARS-CoV-2 by  FDA under an Emergency  Use Authorization (EUA). This EUA will remain  in effect (meaning this test can be used) for the duration of the  Covid-19 declaration under Section 564(b)(1) of the Act, 21  U.S.C. section 360bbb-3(b)(1), unless the authorization is  terminated or revoked. Performed at [facility], [address] [place], [place] [address]   MRSA PCR Screening     Status: None   Collection Time: 10/28/19  1:37 PM   Specimen: Nasal Mucosa; Nasopharyngeal  Result Value Ref Range Status   MRSA by PCR NEGATIVE NEGATIVE Final    Comment:        The GeneXpert MRSA Assay (FDA approved for NASAL specimens only), is one component of a comprehensive MRSA colonization surveillance program. It is not intended to diagnose MRSA infection nor to guide or monitor treatment for MRSA infections. Performed at [facility], [address] [place], [place] [address]      Labs: BNP (last 3 results) No results for input(s): BNP in the last 8760 hours. Basic Metabolic Panel: Recent Labs  Lab 10/28/19 0455 10/29/19 0551 10/30/19 0839 10/31/19 0441 11/01/19 0412  NA 139 142 139 140 137  K 5.1 4.8 4.6 4.5 4.5  CL 113* 116* 112* 112* 109  CO2 20* 19* 18*  18* 19*  GLUCOSE 106* 77 82 79 84  BUN 43* 34* 32* 39* 42*  CREATININE 2.73* 2.88* 3.15* 3.23* 3.49*  CALCIUM 9.5 9.8 10.2 10.0 9.8   Liver Function Tests: Recent Labs  Lab 10/27/19 2218  AST 13*  ALT 8  ALKPHOS 57  BILITOT 0.5  PROT 7.1  ALBUMIN 3.9   Recent Labs  Lab 10/27/19 2218  LIPASE 34   No results for input(s): AMMONIA in the last 168 hours. CBC: Recent Labs  Lab 10/28/19 0455 10/29/19 0551 10/30/19 0839 10/31/19 0441 11/01/19 0412  WBC 8.9 6.9 11.2* 10.8* 9.8  HGB 8.8* 9.2* 10.2* 9.4* 9.4*  HCT 28.8* 30.4* 32.6* 30.6* 30.8*  MCV 92.6 91.6 89.6 89.7 89.8  PLT 271 268 325 269 274   Cardiac Enzymes: No results for input(s): CKTOTAL, CKMB, CKMBINDEX, TROPONINI in the last 168 hours. BNP: Invalid  input(s): POCBNP CBG: Recent Labs  Lab 10/28/19 1939  GLUCAP 95   D-Dimer No results for input(s): DDIMER in the last 72 hours. Hgb A1c No results for input(s): HGBA1C in the last 72 hours. Lipid Profile No results for input(s): CHOL, HDL, LDLCALC, TRIG, CHOLHDL, LDLDIRECT in the last 72 hours. Thyroid function studies No results for input(s): TSH, T4TOTAL, T3FREE, THYROIDAB in the last 72 hours.  Invalid input(s): FREET3 Anemia work up No results for input(s): VITAMINB12, FOLATE, FERRITIN, TIBC, IRON, RETICCTPCT in the last 72 hours. Urinalysis    Component Value Date/Time   COLORURINE STRAW (A) 10/27/2019 2347   APPEARANCEUR CLEAR (A) 10/27/2019 2347   APPEARANCEUR Clear 09/04/2019 1557   LABSPEC 1.010 10/27/2019 2347   LABSPEC 1.014 05/25/2013 0854   PHURINE 6.0 10/27/2019 2347   GLUCOSEU NEGATIVE 10/27/2019 2347   GLUCOSEU Negative 05/25/2013 0854   HGBUR NEGATIVE 10/27/2019 2347   BILIRUBINUR NEGATIVE 10/27/2019 2347   BILIRUBINUR Negative 09/04/2019 1557   BILIRUBINUR Negative 05/25/2013 [place] 10/27/2019 2347   PROTEINUR 100 (A) 10/27/2019 2347   NITRITE NEGATIVE 10/27/2019 2347   LEUKOCYTESUR NEGATIVE 10/27/2019 2347   LEUKOCYTESUR Negative 05/25/2013 0854   Sepsis Labs Invalid input(s): PROCALCITONIN,  WBC,  LACTICIDVEN Microbiology Recent Results (from the past 240 hour(s))  Respiratory Panel by RT PCR (Flu A&B, Covid) - Nasopharyngeal Swab     Status: None   Collection Time: 10/28/19  2:41 AM   Specimen: Nasopharyngeal Swab  Result Value Ref Range Status   SARS Coronavirus 2 by RT PCR NEGATIVE NEGATIVE Final    Comment: (NOTE) SARS-CoV-2 target nucleic acids are NOT DETECTED. The SARS-CoV-2 RNA is generally detectable in upper respiratoy specimens during the acute phase of infection. The lowest concentration of SARS-CoV-2 viral copies this assay can detect is 131 copies/mL. A negative result does not preclude SARS-Cov-2 infection  and should not be used as the sole basis for treatment or other patient management decisions. A negative result may occur with  improper specimen collection/handling, submission of specimen other than nasopharyngeal swab, presence of viral mutation(s) within the areas targeted by this assay, and inadequate number of viral copies (<131 copies/mL). A negative result must be combined with clinical observations, patient history, and epidemiological information. The expected result is Negative. Fact Sheet for Patients:  PinkCheek.be Fact Sheet for Healthcare Providers:  GravelBags.it This test is not yet ap proved or cleared by the [place] FDA and  has been authorized for detection and/or diagnosis of SARS-CoV-2 by FDA under an Emergency Use Authorization (EUA). This EUA will remain  in effect (meaning this  test can be used) for the duration of the COVID-19 declaration under Section 564(b)(1) of the Act, 21 U.S.C. section 360bbb-3(b)(1), unless the authorization is terminated or revoked sooner.    Influenza A by PCR NEGATIVE NEGATIVE Final   Influenza B by PCR NEGATIVE NEGATIVE Final    Comment: (NOTE) The Xpert Xpress SARS-CoV-2/FLU/RSV assay is intended as an aid in  the diagnosis of influenza from Nasopharyngeal swab specimens and  should not be used as a sole basis for treatment. Nasal washings and  aspirates are unacceptable for Xpert Xpress SARS-CoV-2/FLU/RSV  testing. Fact Sheet for Patients: PinkCheek.be Fact Sheet for Healthcare Providers: GravelBags.it This test is not yet approved or cleared by the [place] FDA and  has been authorized for detection and/or diagnosis of SARS-CoV-2 by  FDA under an Emergency Use Authorization (EUA). This EUA will remain  in effect (meaning this test can be used) for the duration of the  Covid-19 declaration under Section  564(b)(1) of the Act, 21  U.S.C. section 360bbb-3(b)(1), unless the authorization is  terminated or revoked. Performed at [facility], [address] [place], [place] [address]   MRSA PCR Screening     Status: None   Collection Time: 10/28/19  1:37 PM   Specimen: Nasal Mucosa; Nasopharyngeal  Result Value Ref Range Status   MRSA by PCR NEGATIVE NEGATIVE Final    Comment:        The GeneXpert MRSA Assay (FDA approved for NASAL specimens only), is one component of a comprehensive MRSA colonization surveillance program. It is not intended to diagnose MRSA infection nor to guide or monitor treatment for MRSA infections. Performed at [facility], [address], [place], [place] [address]      Time coordinating discharge: Over 30 minutes  SIGNED:   Wyvonnia Dusky, MD  [facility] 11/01/2019, 11:55 AM Pager   If 7PM-7AM, please contact night-coverage [email or web address] Password [email or web address]

## 2019-11-01 NOTE — Progress Notes (Signed)
[place], [place] 11/01/19  Subjective:   Hospital day # 4  Overall feels fair.  Denies any acute complaints except discomfort over the chest  Renal: No intake/output data recorded. Lab Results  Component Value Date   CREATININE 3.49 (H) 11/01/2019   CREATININE 3.23 (H) 10/31/2019   CREATININE 3.15 (H) 10/30/2019     Objective:  Vital signs in last 24 hours:  Temp:  [98.3 F (36.8 C)-99.5 F (37.5 C)] 99.5 F (37.5 C) (01/17 0530) Pulse Rate:  [61-62] 62 (01/17 0530) Resp:  [18-20] 20 (01/17 0530) BP: (115-119)/(68-74) 115/68 (01/17 0530) SpO2:  [97 %-98 %] 98 % (01/17 0530)  Weight change:  Filed Weights   10/27/19 1934 10/29/19 0500 10/29/19 1949  Weight: 68 kg 66.9 kg 67.6 kg    Intake/Output:   No intake or output data in the 24 hours ending 11/01/19 1256   Physical Exam: General:  Laying in the bed, no acute distress  HEENT  anicteric, moist oral mucous membranes  Pulm/lungs Room air, clear to auscultation  CVS/Heart  systolic murmur, no rub  Abdomen:   Soft, nontender  Extremities:  No peripheral edema  Neurologic:  Alert, oriented  Skin:  No acute rashes    Basic Metabolic Panel:  Recent Labs  Lab 10/28/19 0455 10/28/19 0455 10/29/19 0551 10/29/19 0551 10/30/19 0839 10/31/19 0441 11/01/19 0412  NA 139  --  142  --  139 140 137  K 5.1  --  4.8  --  4.6 4.5 4.5  CL 113*  --  116*  --  112* 112* 109  CO2 20*  --  19*  --  18* 18* 19*  GLUCOSE 106*  --  77  --  82 79 84  BUN 43*  --  34*  --  32* 39* 42*  CREATININE 2.73*  --  2.88*  --  3.15* 3.23* 3.49*  CALCIUM 9.5   < > 9.8   < > 10.2 10.0 9.8   < > = values in this interval not displayed.     CBC: Recent Labs  Lab 10/28/19 0455 10/29/19 0551 10/30/19 0839 10/31/19 0441 11/01/19 0412  WBC 8.9 6.9 11.2* 10.8* 9.8  HGB 8.8* 9.2* 10.2* 9.4* 9.4*  HCT 28.8* 30.4* 32.6* 30.6* 30.8*  MCV 92.6 91.6 89.6 89.7 89.8  PLT 271 268 325 269 274     No  results found for: HEPBSAG, HEPBSAB, HEPBIGM    Microbiology:  Recent Results (from the past 240 hour(s))  Respiratory Panel by RT PCR (Flu A&B, Covid) - Nasopharyngeal Swab     Status: None   Collection Time: 10/28/19  2:41 AM   Specimen: Nasopharyngeal Swab  Result Value Ref Range Status   SARS Coronavirus 2 by RT PCR NEGATIVE NEGATIVE Final    Comment: (NOTE) SARS-CoV-2 target nucleic acids are NOT DETECTED. The SARS-CoV-2 RNA is generally detectable in upper respiratoy specimens during the acute phase of infection. The lowest concentration of SARS-CoV-2 viral copies this assay can detect is 131 copies/mL. A negative result does not preclude SARS-Cov-2 infection and should not be used as the sole basis for treatment or other patient management decisions. A negative result may occur with  improper specimen collection/handling, submission of specimen other than nasopharyngeal swab, presence of viral mutation(s) within the areas targeted by this assay, and inadequate number of viral copies (<131 copies/mL). A negative result must be combined with clinical observations, patient history, and epidemiological information. The expected result is  Negative. Fact Sheet for Patients:  PinkCheek.be Fact Sheet for Healthcare Providers:  GravelBags.it This test is not yet ap proved or cleared by the [place] FDA and  has been authorized for detection and/or diagnosis of SARS-CoV-2 by FDA under an Emergency Use Authorization (EUA). This EUA will remain  in effect (meaning this test can be used) for the duration of the COVID-19 declaration under Section 564(b)(1) of the Act, 21 U.S.C. section 360bbb-3(b)(1), unless the authorization is terminated or revoked sooner.    Influenza A by PCR NEGATIVE NEGATIVE Final   Influenza B by PCR NEGATIVE NEGATIVE Final    Comment: (NOTE) The Xpert Xpress SARS-CoV-2/FLU/RSV assay is intended as  an aid in  the diagnosis of influenza from Nasopharyngeal swab specimens and  should not be used as a sole basis for treatment. Nasal washings and  aspirates are unacceptable for Xpert Xpress SARS-CoV-2/FLU/RSV  testing. Fact Sheet for Patients: PinkCheek.be Fact Sheet for Healthcare Providers: GravelBags.it This test is not yet approved or cleared by the [place] FDA and  has been authorized for detection and/or diagnosis of SARS-CoV-2 by  FDA under an Emergency Use Authorization (EUA). This EUA will remain  in effect (meaning this test can be used) for the duration of the  Covid-19 declaration under Section 564(b)(1) of the Act, 21  U.S.C. section 360bbb-3(b)(1), unless the authorization is  terminated or revoked. Performed at [facility], [address] [place], [place] [address]   MRSA PCR Screening     Status: None   Collection Time: 10/28/19  1:37 PM   Specimen: Nasal Mucosa; Nasopharyngeal  Result Value Ref Range Status   MRSA by PCR NEGATIVE NEGATIVE Final    Comment:        The GeneXpert MRSA Assay (FDA approved for NASAL specimens only), is one component of a comprehensive MRSA colonization surveillance program. It is not intended to diagnose MRSA infection nor to guide or monitor treatment for MRSA infections. Performed at [facility], [address] [place], [place] [address]     Coagulation Studies: No results for input(s): LABPROT, INR in the last 72 hours.  Urinalysis: No results for input(s): COLORURINE, LABSPEC, PHURINE, GLUCOSEU, HGBUR, BILIRUBINUR, KETONESUR, PROTEINUR, UROBILINOGEN, NITRITE, LEUKOCYTESUR in the last 72 hours.  Invalid input(s): APPERANCEUR    Imaging: No results found.   Medications:    . amLODipine  10 mg Oral Daily  . bisacodyl  5 mg Oral Daily  . bisoprolol  10 mg Oral Daily  . docusate sodium  100 mg Oral BID  . furosemide  10 mg Oral  Daily  . heparin injection (subcutaneous)  5,000 Units Subcutaneous Q8H  . irbesartan  300 mg Oral Daily   acetaminophen, hydrALAZINE, HYDROmorphone (DILAUDID) injection, ondansetron (ZOFRAN) IV, oxyCODONE-acetaminophen  Assessment/ Plan:  64 y.o. 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  No acute indication for dialysis at present If discharged, patient can follow-up for labs in our office midweek  #Chronic kidney disease stage IV.  Baseline creatinine 2.73/GFR 20 from Jan 2021 at admission Underlying CKD likely secondary to hypertension and atherosclerotic disease Patient may establish a new baseline  #Primary hyperparathyroidism Followed at  [facility] clinic endocrinology Bone scan being planned to evaluate for parathyroidectomy Current calcium level at 9.8  #Type B aortic dissection Dissection of upper abdominal aorta originating distal to the left subclavian artery terminating near the left main renal artery origin Patient is followed by [facility] vascular surgery Dr. Sammuel Hines, last seen in October 2020 Blood pressure control is acceptable at present.  Goal less than 140/70, preferred closer to 120/60 as per vascular recommendations   LOS: [place] 1/17/202112:56 PM  [place], [place]  Note: This note was prepared with Dragon dictation. Any transcription errors are unintentional

## 2019-11-01 NOTE — TOC Transition Note (Signed)
Transition of Care [facility]) - CM/SW Discharge Note   Patient Details  Name: Elizabeth Roy MRN: [id number] Date of Birth: 1956/09/26  [facility]) CM/SW Contact:  Victorino Dike, RN Phone Number: 11/01/2019, 12:18 PM   Clinical Narrative:     Patient to discharge home today with family,  She will receive [facility] services from [facility] for PT and OT. No DME is needed at this time. No further TOC needs at this time, please re-consult for new needs.   Final next level of care: [place] Barriers to Discharge: Barriers Resolved   Patient Goals and CMS Choice Patient states their goals for this hospitalization and ongoing recovery are:: to get everything straightened out so I can go home      Discharge Placement                       Discharge Plan and Services   Discharge Planning Services: CM Consult Post Acute Care Choice: [place]: [place]        Social Determinants of Health (SDOH) Interventions     Readmission Risk Interventions No flowsheet data found.

## 2019-11-03 ENCOUNTER — Encounter: Payer: Self-pay | Admitting: Nurse Practitioner

## 2019-11-03 ENCOUNTER — Telehealth: Payer: Self-pay

## 2019-11-03 ENCOUNTER — Ambulatory Visit (INDEPENDENT_AMBULATORY_CARE_PROVIDER_SITE_OTHER): Payer: PPO | Admitting: Nurse Practitioner

## 2019-11-03 VITALS — Ht 59.0 in

## 2019-11-03 DIAGNOSIS — I10 Essential (primary) hypertension: Secondary | ICD-10-CM

## 2019-11-03 DIAGNOSIS — Z09 Encounter for follow-up examination after completed treatment for conditions other than malignant neoplasm: Secondary | ICD-10-CM

## 2019-11-03 DIAGNOSIS — N184 Chronic kidney disease, stage 4 (severe): Secondary | ICD-10-CM | POA: Diagnosis not present

## 2019-11-03 DIAGNOSIS — I7103 Dissection of thoracoabdominal aorta: Secondary | ICD-10-CM | POA: Diagnosis not present

## 2019-11-03 NOTE — Telephone Encounter (Signed)
[facility] will be dropping off orders for plan of care for patient home health. beth 

## 2019-11-03 NOTE — Progress Notes (Signed)
[facility] [place], [place] [address]  Internal MEDICINE  Telephone Visit  Patient Name: Elizabeth Roy  [id number]  [id number]  Date of Service: 11/06/2019  I connected with the patient at 3:38pm by telephone and verified the patients identity using two identifiers.   I discussed the limitations, risks, security and privacy concerns of performing an evaluation and management service by telephone and the availability of in person appointments. I also discussed with the patient that there may be a patient responsible charge related to the service.  The patient expressed understanding and agrees to proceed.    Chief Complaint  Patient presents with  . Telephone Assessment  . Telephone Screen  . Hospitalization Follow-up  . FMLA PAPERWORK    The patient has been contacted via telephone for a hospital follow up visit due to concerns for spread of novel coronavirus. The patient was recently hospitalized for potential rupture of aortic aneurysm. She was hospitalized from 10/27/2019 to 11/01/2019. Hospital opted to manage this tear medically and wants her to follow up with her vascular surgeon as soon as possible.  She is to make appointment with her vascular surgeon, who has been following the aneurysm, within on to two days. She still needs to call to make that appointment. She would like [place] to complete FMLA paperwork. Her job is physically demanding and there is no alternative for light duty at this time. She will have to remain out of work until final release by Dr. Sammuel Hines, her vascular surgeon. She is to continue her regular visits with nephrology.       Current Medication: Outpatient Encounter Medications as of 11/03/2019  Medication Sig  . amLODipine (NORVASC) 10 MG tablet Take 1 tablet (10 mg total) by mouth daily.  . bisoprolol (ZEBETA) 10 MG tablet Take 1 tablet (10 mg total) by mouth daily.  . furosemide (LASIX) 20 MG tablet Take 0.5 tablets (10 mg total) by  mouth daily.  . irbesartan (AVAPRO) 300 MG tablet Take 1 tablet (300 mg total) by mouth daily.  . Olopatadine HCl 0.2 % SOLN Place 1 drop into both eyes daily.  Mar[place] Kitchen oxyCODONE-acetaminophen (PERCOCET/ROXICET) 5-325 MG tablet Take 1 tablet by mouth every 8 (eight) hours as needed for up to 5 days for moderate pain or severe pain.   No facility-administered encounter medications on file as of 11/03/2019.    Surgical History: Past Surgical History:  Procedure Laterality Date  . MOUTH SURGERY      Medical History: Past Medical History:  Diagnosis Date  . Arthritis   . Hypertension   . Renal disorder     Family History: Family History  Problem Relation Age of Onset  . Hypertension Mother   . Intellectual disability Sister   . Stroke Sister     Social History   Socioeconomic History  . Marital status: Widowed    Spouse name: Not on file  . Number of children: Not on file  . Years of education: Not on file  . Highest education level: Not on file  Occupational History  . Not on file  Tobacco Use  . Smoking status: Current Some Day Smoker    Types: Cigarettes  . Smokeless tobacco: Never Used  . Tobacco comment: sometimes  Substance and Sexual Activity  . Alcohol use: Never  . Drug use: No  . Sexual activity: Not on file  Other Topics Concern  . Not on file  Social History Narrative  . Not on file  Social Determinants of Health   Financial Resource Strain:   . Difficulty of Paying Living Expenses: Not on file  Food Insecurity:   . Worried About Charity fundraiser in the Last Year: Not on file  . Ran Out of Food in the Last Year: Not on file  Transportation Needs:   . Lack of Transportation (Medical): Not on file  . Lack of Transportation (Non-Medical): Not on file  Physical Activity:   . Days of Exercise per Week: Not on file  . Minutes of Exercise per Session: Not on file  Stress:   . Feeling of Stress : Not on file  Social Connections:   . Frequency of  Communication with Friends and Family: Not on file  . Frequency of Social Gatherings with Friends and Family: Not on file  . Attends Religious Services: Not on file  . Active Member of Clubs or Organizations: Not on file  . Attends Archivist Meetings: Not on file  . Marital Status: Not on file  Intimate Partner Violence:   . Fear of Current or Ex-Partner: Not on file  . Emotionally Abused: Not on file  . Physically Abused: Not on file  . Sexually Abused: Not on file      Review of Systems  Constitutional: Positive for activity change and fatigue. Negative for chills and fever.  HENT: Negative for hearing loss, rhinorrhea and tinnitus.   Respiratory: Positive for shortness of breath. Negative for wheezing.        Reports shortness of breath with exertion  Cardiovascular: Negative for chest pain and palpitations.       Improved blood pressure since her most recent visit   Gastrointestinal: Positive for abdominal pain. Negative for constipation, nausea and vomiting.  Endocrine: Negative for cold intolerance, heat intolerance, polydipsia and polyuria.  Musculoskeletal: Positive for back pain. Negative for joint swelling.  Skin: Negative for rash and wound.  Allergic/Immunologic: Negative for environmental allergies.  Neurological: Negative for dizziness and weakness.  Hematological: Negative for adenopathy.  Psychiatric/Behavioral: The patient is nervous/anxious.    Today's Vitals   11/03/19 1501  Height: 4\' 11"  (1.499 m)   Body mass index is 30.1 kg/m.  Observation/Objective:   The patient is alert and oriented. She is pleasant and answers all questions appropriately. Breathing is non-labored. She is in no acute distress at this time. The patient sounds worried and in pain.    Assessment/Plan: 1. Dissection of thoracoabdominal aorta (HCC) Recent hospitalization for dissection of abdominal aortic aneurysm. Reviewed hospitalization records, labs, test results and  progress notes. Advised her to make appointment with her vascular surgeon for follow up ASAP. Will complete FMLA paperwork to keep her out of work until she is seen by her vascular surgeon. Will have vasscular surgeon decide when patient is ready to return to work.   2. Essential hypertension Stable. Continue BP medication as prescribed   3. CKD (chronic kidney disease), stage IV [facility]) Patient should follow up with her nephrologist as scheduled.   4. Hospital discharge follow up Recent hospitalization for dissection of abdominal aortic aneurysm. Reviewed hospitalization records, labs, test results and progress notes. Advised her to make appointment with her vascular surgeon for follow up ASAP. Will complete FMLA paperwork to keep her out of work until she is seen by her vascular surgeon. Will have vasscular surgeon decide when patient is ready to return to work.     General Counseling: Nikko verbalizes understanding of the findings of today's phone visit and  agrees with plan of treatment. I have discussed any further diagnostic evaluation that may be needed or ordered today. We also reviewed her medications today. she has been encouraged to call the office with any questions or concerns that should arise related to todays visit.   Hypertension Counseling:   The following hypertensive lifestyle modification were recommended and discussed:  1. Limiting alcohol intake to less than 1 oz/day of ethanol:(24 oz of beer or 8 oz of wine or 2 oz of 100-proof whiskey). 2. Take baby ASA 81 mg daily. 3. Importance of regular aerobic exercise and losing weight. 4. Reduce dietary saturated fat and cholesterol intake for overall cardiovascular health. 5. Maintaining adequate dietary potassium, calcium, and magnesium intake. 6. Regular monitoring of the blood pressure. 7. Reduce sodium intake to less than 100 mmol/day (less than 2.3 gm of sodium or less than 6 gm of sodium choride)   This patient was seen  by [place] with Dr Lavera Guise as a part of collaborative care agreement  Time spent: 11 Minutes    Dr Lavera Guise Internal medicine

## 2019-11-05 DIAGNOSIS — I7100 Dissection of unspecified site of aorta: Secondary | ICD-10-CM | POA: Diagnosis not present

## 2019-11-05 DIAGNOSIS — I716 Thoracoabdominal aortic aneurysm, without rupture: Secondary | ICD-10-CM | POA: Diagnosis not present

## 2019-11-06 ENCOUNTER — Telehealth: Payer: Self-pay

## 2019-11-06 DIAGNOSIS — I714 Abdominal aortic aneurysm, without rupture: Secondary | ICD-10-CM | POA: Diagnosis not present

## 2019-11-06 DIAGNOSIS — I10 Essential (primary) hypertension: Secondary | ICD-10-CM | POA: Diagnosis not present

## 2019-11-06 DIAGNOSIS — M1990 Unspecified osteoarthritis, unspecified site: Secondary | ICD-10-CM | POA: Diagnosis not present

## 2019-11-06 DIAGNOSIS — F17210 Nicotine dependence, cigarettes, uncomplicated: Secondary | ICD-10-CM | POA: Diagnosis not present

## 2019-11-06 DIAGNOSIS — I7102 Dissection of abdominal aorta: Secondary | ICD-10-CM | POA: Diagnosis not present

## 2019-11-06 DIAGNOSIS — N184 Chronic kidney disease, stage 4 (severe): Secondary | ICD-10-CM | POA: Diagnosis not present

## 2019-11-06 DIAGNOSIS — Z09 Encounter for follow-up examination after completed treatment for conditions other than malignant neoplasm: Secondary | ICD-10-CM | POA: Insufficient documentation

## 2019-11-06 DIAGNOSIS — I129 Hypertensive chronic kidney disease with stage 1 through stage 4 chronic kidney disease, or unspecified chronic kidney disease: Secondary | ICD-10-CM | POA: Diagnosis not present

## 2019-11-06 DIAGNOSIS — D649 Anemia, unspecified: Secondary | ICD-10-CM | POA: Diagnosis not present

## 2019-11-06 NOTE — Telephone Encounter (Signed)
Patient FMLA paperwork has been filled and out and its in [employer] folder to be signed. Beth

## 2019-11-06 NOTE — Telephone Encounter (Signed)
PT HOME HEALTH NURSE CALLED STATING THAT PATIENT IS HAVING SEVERE ABDOMINAL PAIN AND HAS NOT HAD A BOWEL MOVEMENT  SINCE LAST Sunday. PT NURSE WAS CONCERNED BECAUSE PT WAS IN THE HOSP FOR AAA, DISSECTION OF [place]. PT IS ALSO NOT EATING NURSE STATES.   CALLED DR. KHAN AND CALLED PT AND ADVISED HER TO TAKE MIRALAX TWICE DAILY AND DULCOLAX 5MG  DAILY AND GIVE [place] A CALL ON Monday TO SEE HOW SHE IS DOING. ALSO ADVISED PT THAT IF SHE IS UNABLE TO BEAR THE PAIN TO GO TO THE ER.

## 2019-11-08 DIAGNOSIS — R109 Unspecified abdominal pain: Secondary | ICD-10-CM | POA: Diagnosis not present

## 2019-11-08 DIAGNOSIS — E875 Hyperkalemia: Secondary | ICD-10-CM | POA: Diagnosis not present

## 2019-11-08 DIAGNOSIS — E8770 Fluid overload, unspecified: Secondary | ICD-10-CM | POA: Diagnosis not present

## 2019-11-08 DIAGNOSIS — R918 Other nonspecific abnormal finding of lung field: Secondary | ICD-10-CM | POA: Diagnosis not present

## 2019-11-08 DIAGNOSIS — Z6827 Body mass index (BMI) 27.0-27.9, adult: Secondary | ICD-10-CM | POA: Diagnosis not present

## 2019-11-08 DIAGNOSIS — I7103 Dissection of thoracoabdominal aorta: Secondary | ICD-10-CM | POA: Diagnosis not present

## 2019-11-08 DIAGNOSIS — Z87891 Personal history of nicotine dependence: Secondary | ICD-10-CM | POA: Diagnosis not present

## 2019-11-08 DIAGNOSIS — N179 Acute kidney failure, unspecified: Secondary | ICD-10-CM | POA: Insufficient documentation

## 2019-11-08 DIAGNOSIS — M1990 Unspecified osteoarthritis, unspecified site: Secondary | ICD-10-CM | POA: Diagnosis not present

## 2019-11-08 DIAGNOSIS — I77810 Thoracic aortic ectasia: Secondary | ICD-10-CM | POA: Diagnosis not present

## 2019-11-08 DIAGNOSIS — J189 Pneumonia, unspecified organism: Secondary | ICD-10-CM | POA: Diagnosis not present

## 2019-11-08 DIAGNOSIS — I071 Rheumatic tricuspid insufficiency: Secondary | ICD-10-CM | POA: Diagnosis not present

## 2019-11-08 DIAGNOSIS — I712 Thoracic aortic aneurysm, without rupture: Secondary | ICD-10-CM | POA: Diagnosis not present

## 2019-11-08 DIAGNOSIS — J181 Lobar pneumonia, unspecified organism: Secondary | ICD-10-CM | POA: Insufficient documentation

## 2019-11-08 DIAGNOSIS — J90 Pleural effusion, not elsewhere classified: Secondary | ICD-10-CM | POA: Diagnosis not present

## 2019-11-08 DIAGNOSIS — I10 Essential (primary) hypertension: Secondary | ICD-10-CM | POA: Diagnosis not present

## 2019-11-08 DIAGNOSIS — R072 Precordial pain: Secondary | ICD-10-CM | POA: Diagnosis not present

## 2019-11-08 DIAGNOSIS — I517 Cardiomegaly: Secondary | ICD-10-CM | POA: Diagnosis not present

## 2019-11-08 DIAGNOSIS — F17210 Nicotine dependence, cigarettes, uncomplicated: Secondary | ICD-10-CM | POA: Diagnosis not present

## 2019-11-08 DIAGNOSIS — I272 Pulmonary hypertension, unspecified: Secondary | ICD-10-CM | POA: Diagnosis not present

## 2019-11-08 DIAGNOSIS — I716 Thoracoabdominal aortic aneurysm, without rupture: Secondary | ICD-10-CM | POA: Diagnosis not present

## 2019-11-08 DIAGNOSIS — I7101 Dissection of thoracic aorta: Secondary | ICD-10-CM | POA: Diagnosis not present

## 2019-11-08 DIAGNOSIS — N184 Chronic kidney disease, stage 4 (severe): Secondary | ICD-10-CM | POA: Diagnosis not present

## 2019-11-08 DIAGNOSIS — Z20822 Contact with and (suspected) exposure to covid-19: Secondary | ICD-10-CM | POA: Diagnosis not present

## 2019-11-08 DIAGNOSIS — R0789 Other chest pain: Secondary | ICD-10-CM | POA: Diagnosis not present

## 2019-11-08 DIAGNOSIS — I313 Pericardial effusion (noninflammatory): Secondary | ICD-10-CM | POA: Diagnosis not present

## 2019-11-08 DIAGNOSIS — I129 Hypertensive chronic kidney disease with stage 1 through stage 4 chronic kidney disease, or unspecified chronic kidney disease: Secondary | ICD-10-CM | POA: Diagnosis not present

## 2019-11-08 DIAGNOSIS — Z791 Long term (current) use of non-steroidal anti-inflammatories (NSAID): Secondary | ICD-10-CM | POA: Diagnosis not present

## 2019-11-08 DIAGNOSIS — Z720 Tobacco use: Secondary | ICD-10-CM | POA: Diagnosis not present

## 2019-11-09 ENCOUNTER — Telehealth: Payer: Self-pay

## 2019-11-09 ENCOUNTER — Ambulatory Visit: Payer: PPO | Admitting: Nurse Practitioner

## 2019-11-09 NOTE — Telephone Encounter (Signed)
Patient FMLA disability paperwork up front ready for pick up, once I called patient to advise she let us know that she is currently admitted with [facility] hospital. Elizabeth Roy

## 2019-11-10 MED ORDER — BISACODYL 10 MG RE SUPP
10.00 | RECTAL | Status: DC
Start: 2019-11-12 — End: 2019-11-10

## 2019-11-10 MED ORDER — GENERIC EXTERNAL MEDICATION
1.00 | Status: DC
Start: 2019-11-11 — End: 2019-11-10

## 2019-11-10 MED ORDER — GABAPENTIN 100 MG PO CAPS
100.00 | ORAL_CAPSULE | ORAL | Status: DC
Start: 2019-11-11 — End: 2019-11-10

## 2019-11-10 MED ORDER — ACETAMINOPHEN 500 MG PO TABS
1000.00 | ORAL_TABLET | ORAL | Status: DC
Start: 2019-11-11 — End: 2019-11-10

## 2019-11-10 MED ORDER — AMLODIPINE BESYLATE 10 MG PO TABS
10.00 | ORAL_TABLET | ORAL | Status: DC
Start: 2019-11-12 — End: 2019-11-10

## 2019-11-10 MED ORDER — IPRATROPIUM-ALBUTEROL 0.5-2.5 (3) MG/3ML IN SOLN
3.00 | RESPIRATORY_TRACT | Status: DC
Start: 2019-11-11 — End: 2019-11-10

## 2019-11-10 MED ORDER — METOPROLOL TARTRATE 25 MG PO TABS
25.00 | ORAL_TABLET | ORAL | Status: DC
Start: 2019-11-11 — End: 2019-11-10

## 2019-11-10 MED ORDER — LABETALOL HCL 5 MG/ML IV SOLN
10.00 | INTRAVENOUS | Status: DC
Start: ? — End: 2019-11-10

## 2019-11-10 MED ORDER — POLYETHYLENE GLYCOL 3350 17 GM/SCOOP PO POWD
17.00 | ORAL | Status: DC
Start: 2019-11-12 — End: 2019-11-10

## 2019-11-10 MED ORDER — SODIUM CHLORIDE 0.9 % IV SOLN
50.00 | INTRAVENOUS | Status: DC
Start: ? — End: 2019-11-10

## 2019-11-10 MED ORDER — ONDANSETRON HCL 4 MG/2ML IJ SOLN
4.00 | INTRAMUSCULAR | Status: DC
Start: ? — End: 2019-11-10

## 2019-11-10 MED ORDER — FUROSEMIDE 20 MG PO TABS
20.00 | ORAL_TABLET | ORAL | Status: DC
Start: 2019-11-11 — End: 2019-11-10

## 2019-11-10 MED ORDER — ACETAMINOPHEN 325 MG PO TABS
650.00 | ORAL_TABLET | ORAL | Status: DC
Start: ? — End: 2019-11-10

## 2019-11-10 MED ORDER — GENERIC EXTERNAL MEDICATION
Status: DC
Start: ? — End: 2019-11-10

## 2019-11-10 MED ORDER — HYDRALAZINE HCL 20 MG/ML IJ SOLN
10.00 | INTRAMUSCULAR | Status: DC
Start: ? — End: 2019-11-10

## 2019-11-10 MED ORDER — HEPARIN SODIUM (PORCINE) 5000 UNIT/ML IJ SOLN
5000.00 | INTRAMUSCULAR | Status: DC
Start: 2019-11-11 — End: 2019-11-10

## 2019-11-11 ENCOUNTER — Encounter: Payer: Self-pay | Admitting: Nurse Practitioner

## 2019-11-11 MED ORDER — GENERIC EXTERNAL MEDICATION
Status: DC
Start: ? — End: 2019-11-11

## 2019-11-12 ENCOUNTER — Telehealth: Payer: Self-pay

## 2019-11-12 MED ORDER — GENERIC EXTERNAL MEDICATION
Status: DC
Start: ? — End: 2019-11-12

## 2019-11-12 NOTE — Telephone Encounter (Signed)
PT CALLED STATING THAT HER FMLA PAPERWORK HAD TO BE FAXED BY 11/19/19, PER BETH I ADVISED PT THAT PAPERWORK WAS FAXED, BUT IF THEY DID NOT RECEIVE IT THEN SHE CAN COME PICK UP A COPY IF NEEDED.

## 2019-11-18 DIAGNOSIS — R809 Proteinuria, unspecified: Secondary | ICD-10-CM | POA: Diagnosis not present

## 2019-11-18 DIAGNOSIS — E210 Primary hyperparathyroidism: Secondary | ICD-10-CM | POA: Diagnosis not present

## 2019-11-18 DIAGNOSIS — I10 Essential (primary) hypertension: Secondary | ICD-10-CM | POA: Diagnosis not present

## 2019-11-18 DIAGNOSIS — N184 Chronic kidney disease, stage 4 (severe): Secondary | ICD-10-CM | POA: Diagnosis not present

## 2019-11-18 DIAGNOSIS — I716 Thoracoabdominal aortic aneurysm, without rupture: Secondary | ICD-10-CM | POA: Diagnosis not present

## 2019-11-19 DIAGNOSIS — I716 Thoracoabdominal aortic aneurysm, without rupture: Secondary | ICD-10-CM | POA: Diagnosis not present

## 2019-11-19 DIAGNOSIS — I7100 Dissection of unspecified site of aorta: Secondary | ICD-10-CM | POA: Diagnosis not present

## 2019-11-25 DIAGNOSIS — N184 Chronic kidney disease, stage 4 (severe): Secondary | ICD-10-CM | POA: Diagnosis not present

## 2019-11-30 ENCOUNTER — Other Ambulatory Visit: Payer: Self-pay

## 2019-11-30 MED ORDER — BISOPROLOL FUMARATE 10 MG PO TABS: 10.0000 mg | ORAL_TABLET | Freq: Every day | ORAL | 3 refills | Status: DC

## 2019-12-03 ENCOUNTER — Other Ambulatory Visit: Payer: Self-pay

## 2019-12-03 MED ORDER — BISOPROLOL FUMARATE 10 MG PO TABS: 10.0000 mg | ORAL_TABLET | Freq: Every day | ORAL | 3 refills | Status: DC

## 2019-12-06 DIAGNOSIS — I129 Hypertensive chronic kidney disease with stage 1 through stage 4 chronic kidney disease, or unspecified chronic kidney disease: Secondary | ICD-10-CM | POA: Diagnosis not present

## 2019-12-07 ENCOUNTER — Other Ambulatory Visit: Payer: Self-pay | Admitting: *Deleted

## 2019-12-07 NOTE — Patient Outreach (Signed)
Telephone outreach for HTA NP Referral.  No answer, able to leave a message and requested a return call. Intent to follow up on pt condition and any medical needs that are not being addressed.  Also send primary care provider a message regarding if she could identify any care management needs her pt has that we could assist with.   Elizabeth Roy. [name], MSN, [facility] Gerontological Nurse Practitioner [facility] Care Management [phone]

## 2019-12-08 ENCOUNTER — Encounter: Payer: Self-pay | Admitting: *Deleted

## 2019-12-08 ENCOUNTER — Telehealth: Payer: Self-pay

## 2019-12-08 NOTE — Telephone Encounter (Signed)
Printed and mailed patient records to [place], and advised they need to request patient records from [facility] and [facility] for records. Beth

## 2019-12-11 ENCOUNTER — Telehealth: Payer: Self-pay

## 2019-12-11 ENCOUNTER — Other Ambulatory Visit: Payer: Self-pay

## 2019-12-11 NOTE — Telephone Encounter (Signed)
LMOM FOR PATIENT TO CONFIRM AND SCREEN FOR 12-15-19 OV.

## 2019-12-15 ENCOUNTER — Encounter: Payer: Self-pay | Admitting: Nurse Practitioner

## 2019-12-15 ENCOUNTER — Ambulatory Visit (INDEPENDENT_AMBULATORY_CARE_PROVIDER_SITE_OTHER): Payer: PPO | Admitting: Nurse Practitioner

## 2019-12-15 ENCOUNTER — Other Ambulatory Visit: Payer: Self-pay

## 2019-12-15 VITALS — BP 132/76 | HR 55 | Temp 97.3°F | Resp 16 | Ht 59.0 in | Wt 135.4 lb

## 2019-12-15 DIAGNOSIS — I10 Essential (primary) hypertension: Secondary | ICD-10-CM | POA: Diagnosis not present

## 2019-12-15 DIAGNOSIS — N184 Chronic kidney disease, stage 4 (severe): Secondary | ICD-10-CM

## 2019-12-15 DIAGNOSIS — E782 Mixed hyperlipidemia: Secondary | ICD-10-CM | POA: Diagnosis not present

## 2019-12-15 DIAGNOSIS — I7103 Dissection of thoracoabdominal aorta: Secondary | ICD-10-CM

## 2019-12-15 MED ORDER — AMLODIPINE BESYLATE 10 MG PO TABS: 10.0000 mg | ORAL_TABLET | Freq: Every day | ORAL | 1 refills | Status: DC

## 2019-12-15 MED ORDER — BISOPROLOL FUMARATE 10 MG PO TABS: 10.0000 mg | ORAL_TABLET | Freq: Every day | ORAL | 1 refills | Status: DC

## 2019-12-15 NOTE — Progress Notes (Signed)
[facility] [place], [place] [address]  Internal MEDICINE  Office Visit Note  Patient Name: Elizabeth Roy  [id number]  [id number]  Date of Service: 12/20/2019  Chief Complaint  Patient presents with  . Hypertension  . Arthritis  . Fatigue  . Medication Management    The patient is here for follow up. She was hospitalized from 10/27/2019 to 11/01/2019.  She was seen vascular surgeon, Dr. Sammuel Hines, at [facility]. Has opted out of surgical repair of aortic aneurism until patient is established on dialysis, as her recent hospitalization resulted in acute kidney injury on top of chronic kidney disease. Surgeon feels as though surgery would cause worse damage to the kidneys and patient may not survive. Nephrologist is on board with this plan, however, patient is hesitant to go forth with this plan. She is extremely anxious about dialysis. Until she decides to proceed with dialysis, vascular surgeon has allowed patient to return to work. The patient states that she has been back to work for about two weeks. She is extremely fatigued and states that the [place] hours on her feet are difficult to complete. There is no light duty jobs available at her place of employment. The patient does have a class tomorrow regarding initiation of dialysis. She does plan to go. I stongly encouraged the patient to reconsider her decision to delay the plan from vascular surgeon. She states that she will take some time to consider all options and discuss dialysis further with nephrology.       Current Medication: Outpatient Encounter Medications as of 12/15/2019  Medication Sig  . amLODipine (NORVASC) 10 MG tablet Take 1 tablet (10 mg total) by mouth daily.  . bisoprolol (ZEBETA) 10 MG tablet Take 1 tablet (10 mg total) by mouth daily.  . Olopatadine HCl 0.2 % SOLN Place 1 drop into both eyes daily.  . [DISCONTINUED] amLODipine (NORVASC) 10 MG tablet Take 1 tablet (10 mg total) by mouth daily.  .  [DISCONTINUED] bisoprolol (ZEBETA) 10 MG tablet Take 1 tablet (10 mg total) by mouth daily.  . furosemide (LASIX) 20 MG tablet Take 0.5 tablets (10 mg total) by mouth daily.  . irbesartan (AVAPRO) 300 MG tablet Take 1 tablet (300 mg total) by mouth daily.   No facility-administered encounter medications on file as of 12/15/2019.    Surgical History: Past Surgical History:  Procedure Laterality Date  . MOUTH SURGERY      Medical History: Past Medical History:  Diagnosis Date  . Arthritis   . Hypertension   . Renal disorder     Family History: Family History  Problem Relation Age of Onset  . Hypertension Mother   . Intellectual disability Sister   . Stroke Sister     Social History   Socioeconomic History  . Marital status: Widowed    Spouse name: Not on file  . Number of children: Not on file  . Years of education: Not on file  . Highest education level: Not on file  Occupational History  . Not on file  Tobacco Use  . Smoking status: Former Smoker    Types: Cigarettes  . Smokeless tobacco: Never Used  Substance and Sexual Activity  . Alcohol use: Never  . Drug use: No  . Sexual activity: Not on file  Other Topics Concern  . Not on file  Social History Narrative  . Not on file   Social Determinants of Health   Financial Resource Strain:   . Difficulty of  Paying Living Expenses: Not on file  Food Insecurity:   . Worried About Charity fundraiser in the Last Year: Not on file  . Ran Out of Food in the Last Year: Not on file  Transportation Needs:   . Lack of Transportation (Medical): Not on file  . Lack of Transportation (Non-Medical): Not on file  Physical Activity:   . Days of Exercise per Week: Not on file  . Minutes of Exercise per Session: Not on file  Stress:   . Feeling of Stress : Not on file  Social Connections:   . Frequency of Communication with Friends and Family: Not on file  . Frequency of Social Gatherings with Friends and Family: Not on  file  . Attends Religious Services: Not on file  . Active Member of Clubs or Organizations: Not on file  . Attends Archivist Meetings: Not on file  . Marital Status: Not on file  Intimate Partner Violence:   . Fear of Current or Ex-Partner: Not on file  . Emotionally Abused: Not on file  . Physically Abused: Not on file  . Sexually Abused: Not on file      Review of Systems  Constitutional: Positive for activity change and fatigue. Negative for chills and fever.  HENT: Negative for hearing loss, rhinorrhea and tinnitus.   Respiratory: Positive for shortness of breath. Negative for wheezing.        Reports shortness of breath with exertion  Cardiovascular: Negative for chest pain and palpitations.       Improved blood pressure since her most recent visit   Gastrointestinal: Positive for abdominal pain. Negative for constipation, nausea and vomiting.  Endocrine: Negative for cold intolerance, heat intolerance, polydipsia and polyuria.  Musculoskeletal: Positive for back pain. Negative for joint swelling.  Skin: Negative for rash and wound.  Allergic/Immunologic: Negative for environmental allergies.  Neurological: Negative for dizziness and weakness.  Hematological: Negative for adenopathy.  Psychiatric/Behavioral: The patient is nervous/anxious.     Today's Vitals   12/15/19 1559  BP: 132/76  Pulse: (!) 55  Resp: 16  Temp: (!) 97.3 F (36.3 C)  SpO2: 99%  Weight: 135 lb 6.4 oz (61.4 kg)   Body mass index is 27.35 kg/m.  Physical Exam Vitals and nursing note reviewed.  Constitutional:      General: She is not in acute distress.    Appearance: Normal appearance. She is well-developed. She is not diaphoretic.  HENT:     Head: Normocephalic and atraumatic.     Mouth/Throat:     Pharynx: No oropharyngeal exudate.  Eyes:     Pupils: Pupils are equal, round, and reactive to light.  Neck:     Thyroid: No thyromegaly.     Vascular: No JVD.     Trachea: No  tracheal deviation.  Cardiovascular:     Rate and Rhythm: Normal rate and regular rhythm.     Heart sounds: Normal heart sounds. No murmur. No friction rub. No gallop.   Pulmonary:     Effort: Pulmonary effort is normal. No respiratory distress.     Breath sounds: Normal breath sounds. No wheezing or rales.  Chest:     Chest wall: No tenderness.  Abdominal:     General: Bowel sounds are normal.     Palpations: Abdomen is soft.     Tenderness: There is abdominal tenderness.     Comments: Mild, generalized abdominal tenderness with palpation  Musculoskeletal:        General:  Normal range of motion.     Cervical back: Normal range of motion and neck supple.  Lymphadenopathy:     Cervical: No cervical adenopathy.  Skin:    General: Skin is warm and dry.  Neurological:     General: No focal deficit present.     Mental Status: She is alert and oriented to person, place, and time.     Cranial Nerves: No cranial nerve deficit.  Psychiatric:        Mood and Affect: Mood normal.        Behavior: Behavior normal.        Thought Content: Thought content normal.        Judgment: Judgment normal.    Assessment/Plan: 1. Essential hypertension Blood pressure currently stable. Continue bp medications as prescribed  - amLODipine (NORVASC) 10 MG tablet; Take 1 tablet (10 mg total) by mouth daily.  Dispense: 90 tablet; Refill: 1 - bisoprolol (ZEBETA) 10 MG tablet; Take 1 tablet (10 mg total) by mouth daily.  Dispense: 90 tablet; Refill: 1  2. Dissection of thoracoabdominal aorta [facility]) Discussed plan from vascular surgeon. Currently, vascular surgeon wants patient to have dialysis established and stable prior to surgical repair of AAA.   3. CKD (chronic kidney disease), stage IV ([place]) Kidney disease worse after most recent hospitalization. Patient to attend class about dialysis tomorrow. Will discuss options with nephrology further at next visit.     General Counseling: Elliot verbalizes  understanding of the findings of todays visit and agrees with plan of treatment. I have discussed any further diagnostic evaluation that may be needed or ordered today. We also reviewed her medications today. she has been encouraged to call the office with any questions or concerns that should arise related to todays visit.   Hypertension Counseling:   The following hypertensive lifestyle modification were recommended and discussed:  1. Limiting alcohol intake to less than 1 oz/day of ethanol:(24 oz of beer or 8 oz of wine or 2 oz of 100-proof whiskey). 2. Take baby ASA 81 mg daily. 3. Importance of regular aerobic exercise and losing weight. 4. Reduce dietary saturated fat and cholesterol intake for overall cardiovascular health. 5. Maintaining adequate dietary potassium, calcium, and magnesium intake. 6. Regular monitoring of the blood pressure. 7. Reduce sodium intake to less than 100 mmol/day (less than 2.3 gm of sodium or less than 6 gm of sodium choride)   This patient was seen by [place] with Dr Lavera Guise as a part of collaborative care agreement  Meds ordered this encounter  Medications  . amLODipine (NORVASC) 10 MG tablet    Sig: Take 1 tablet (10 mg total) by mouth daily.    Dispense:  90 tablet    Refill:  1    Order Specific Question:   Supervising Provider    Answer:   Lavera Guise [id number]  . bisoprolol (ZEBETA) 10 MG tablet    Sig: Take 1 tablet (10 mg total) by mouth daily.    Dispense:  90 tablet    Refill:  1    Order Specific Question:   Supervising Provider    Answer:   Lavera Guise [id number]    Total time spent: 30 Minutes   Time spent includes review of chart, medications, test results, and follow up plan with the patient.      Dr Lavera Guise Internal medicine

## 2019-12-16 ENCOUNTER — Telehealth: Payer: Self-pay

## 2019-12-16 DIAGNOSIS — E210 Primary hyperparathyroidism: Secondary | ICD-10-CM | POA: Diagnosis not present

## 2019-12-16 DIAGNOSIS — R809 Proteinuria, unspecified: Secondary | ICD-10-CM | POA: Diagnosis not present

## 2019-12-16 DIAGNOSIS — N184 Chronic kidney disease, stage 4 (severe): Secondary | ICD-10-CM | POA: Diagnosis not present

## 2019-12-16 NOTE — Telephone Encounter (Signed)
PLAN OF CARE SIGNED AND MAILED TO [place].

## 2019-12-17 DIAGNOSIS — R809 Proteinuria, unspecified: Secondary | ICD-10-CM | POA: Diagnosis not present

## 2019-12-22 ENCOUNTER — Telehealth: Payer: Self-pay

## 2019-12-22 NOTE — Telephone Encounter (Signed)
ORDER AND PLAN OF CARE SIGNED BY PROVIDER AND PLACED IN [place].

## 2020-01-11 DIAGNOSIS — E210 Primary hyperparathyroidism: Secondary | ICD-10-CM | POA: Diagnosis not present

## 2020-01-11 DIAGNOSIS — I716 Thoracoabdominal aortic aneurysm, without rupture: Secondary | ICD-10-CM | POA: Diagnosis not present

## 2020-01-11 DIAGNOSIS — R809 Proteinuria, unspecified: Secondary | ICD-10-CM | POA: Diagnosis not present

## 2020-01-11 DIAGNOSIS — N184 Chronic kidney disease, stage 4 (severe): Secondary | ICD-10-CM | POA: Diagnosis not present

## 2020-01-11 DIAGNOSIS — I10 Essential (primary) hypertension: Secondary | ICD-10-CM | POA: Diagnosis not present

## 2020-02-24 DIAGNOSIS — I716 Thoracoabdominal aortic aneurysm, without rupture: Secondary | ICD-10-CM | POA: Diagnosis not present

## 2020-02-24 DIAGNOSIS — E210 Primary hyperparathyroidism: Secondary | ICD-10-CM | POA: Diagnosis not present

## 2020-02-24 DIAGNOSIS — D631 Anemia in chronic kidney disease: Secondary | ICD-10-CM | POA: Diagnosis not present

## 2020-02-24 DIAGNOSIS — R809 Proteinuria, unspecified: Secondary | ICD-10-CM | POA: Diagnosis not present

## 2020-02-24 DIAGNOSIS — R600 Localized edema: Secondary | ICD-10-CM | POA: Diagnosis not present

## 2020-02-24 DIAGNOSIS — I10 Essential (primary) hypertension: Secondary | ICD-10-CM | POA: Diagnosis not present

## 2020-02-24 DIAGNOSIS — R3129 Other microscopic hematuria: Secondary | ICD-10-CM | POA: Diagnosis not present

## 2020-02-24 DIAGNOSIS — N184 Chronic kidney disease, stage 4 (severe): Secondary | ICD-10-CM | POA: Diagnosis not present

## 2020-02-25 ENCOUNTER — Other Ambulatory Visit: Payer: Self-pay | Admitting: Nephrology

## 2020-02-25 DIAGNOSIS — R3129 Other microscopic hematuria: Secondary | ICD-10-CM

## 2020-03-10 ENCOUNTER — Other Ambulatory Visit (INDEPENDENT_AMBULATORY_CARE_PROVIDER_SITE_OTHER): Payer: Self-pay | Admitting: Vascular Surgery

## 2020-03-10 DIAGNOSIS — N186 End stage renal disease: Secondary | ICD-10-CM

## 2020-03-10 DIAGNOSIS — Z992 Dependence on renal dialysis: Secondary | ICD-10-CM

## 2020-03-15 ENCOUNTER — Ambulatory Visit (INDEPENDENT_AMBULATORY_CARE_PROVIDER_SITE_OTHER): Payer: PPO

## 2020-03-15 ENCOUNTER — Other Ambulatory Visit: Payer: Self-pay

## 2020-03-15 ENCOUNTER — Ambulatory Visit (INDEPENDENT_AMBULATORY_CARE_PROVIDER_SITE_OTHER): Payer: PPO | Admitting: Vascular Surgery

## 2020-03-15 ENCOUNTER — Encounter (INDEPENDENT_AMBULATORY_CARE_PROVIDER_SITE_OTHER): Payer: Self-pay | Admitting: Vascular Surgery

## 2020-03-15 VITALS — BP 155/85 | HR 60 | Resp 16 | Ht 60.0 in | Wt 137.6 lb

## 2020-03-15 DIAGNOSIS — I712 Thoracic aortic aneurysm, without rupture, unspecified: Secondary | ICD-10-CM

## 2020-03-15 DIAGNOSIS — Z992 Dependence on renal dialysis: Secondary | ICD-10-CM

## 2020-03-15 DIAGNOSIS — N186 End stage renal disease: Secondary | ICD-10-CM

## 2020-03-15 DIAGNOSIS — I10 Essential (primary) hypertension: Secondary | ICD-10-CM

## 2020-03-15 DIAGNOSIS — N184 Chronic kidney disease, stage 4 (severe): Secondary | ICD-10-CM | POA: Diagnosis not present

## 2020-03-15 NOTE — Assessment & Plan Note (Signed)
The patient had a vein mapping today which shows no adequate superficial vein for fistula creation in either upper extremity.  Given the fact that she is only at stage IV chronic kidney disease and does not have dialysis needs in the next few weeks, we would generally defer graft placement until dialysis is eminent.  I have reached out to her nephrologist to make sure that this is acceptable and he feels that she does have a little time before she would start dialysis so we will hold off on any access creation at this point.  I will await to hear word from her nephrologist that she is nearing dialysis needs and at that time we will schedule her for a left arm AV graft as she is right-hand dominant.

## 2020-03-15 NOTE — Assessment & Plan Note (Signed)
5.7 cm at last check.  Followed by Dr. Sammuel Hines at [facility].

## 2020-03-15 NOTE — Assessment & Plan Note (Signed)
Likely an underlying cause of her renal insufficiency and blood pressure control important in reducing the progression of atherosclerotic disease and aneurysmal growth. On appropriate oral medications.

## 2020-03-15 NOTE — Progress Notes (Signed)
Patient ID: Elizabeth Roy, female   DOB: 01-02-1956, 64 y.o.   MRN: [id number]  Chief Complaint  Patient presents with  . New Patient (Initial Visit)    ref Tedrow vein mapping    HPI Elizabeth Roy is a 64 y.o. female.  I am asked to see the patient by M. Tedrow and Dr. Candiss Norse for evaluation of dialysis access placement.  The patient was actually seen in our office about 2 years ago for a thoracoabdominal aneurysm where she was ultimately referred to Dr. Sammuel Hines at [facility] who has been following her for this.  This now measures 5.7 cm in maximal diameter.  At that time, she had fairly mild chronic kidney disease but it has continued to progress.  She now has stage IV chronic kidney disease and is nearing dialysis dependence.  As such, she is referred for evaluation for permanent dialysis access.  She has had some fluid retention and some lethargy, but no clear uremic symptoms that would prompt immediate dialysis.  Her aneurysm repair has been deferred until her aneurysm grows to a larger size at least in part due to the likelihood she would need to be on dialysis after the repair.     Past Medical History:  Diagnosis Date  . Arthritis   . Hypertension   . Renal disorder     Past Surgical History:  Procedure Laterality Date  . MOUTH SURGERY       Family History  Problem Relation Age of Onset  . Hypertension Mother   . Intellectual disability Sister   . Stroke Sister   no bleeding or clotting disorders   Social History   Tobacco Use  . Smoking status: Former Smoker    Types: Cigarettes  . Smokeless tobacco: Never Used  Substance Use Topics  . Alcohol use: Never  . Drug use: No     No Known Allergies  Current Outpatient Medications  Medication Sig Dispense Refill  . amLODipine (NORVASC) 10 MG tablet Take 1 tablet (10 mg total) by mouth daily. 90 tablet 1  . bisoprolol (ZEBETA) 10 MG tablet Take 1 tablet (10 mg total) by mouth daily. 90 tablet 1  . ergocalciferol (VITAMIN  D2) 1.25 MG (50000 UT) capsule Take by mouth.    . Olopatadine HCl 0.2 % SOLN Place 1 drop into both eyes daily. 2.5 mL 5  . sodium bicarbonate 650 MG tablet Take by mouth.    . furosemide (LASIX) 20 MG tablet Take 0.5 tablets (10 mg total) by mouth daily. 15 tablet 0  . irbesartan (AVAPRO) 300 MG tablet Take 1 tablet (300 mg total) by mouth daily. 30 tablet 0   No current facility-administered medications for this visit.      REVIEW OF SYSTEMS (Negative unless checked)  Constitutional: [] Weight loss  [] Fever  [] Chills Cardiac: [] Chest pain   [] Chest pressure   [] Palpitations   [] Shortness of breath when laying flat   [] Shortness of breath at rest   [] Shortness of breath with exertion. Vascular:  [] Pain in legs with walking   [] Pain in legs at rest   [] Pain in legs when laying flat   [] Claudication   [] Pain in feet when walking  [] Pain in feet at rest  [] Pain in feet when laying flat   [] History of DVT   [] Phlebitis   [] Swelling in legs   [] Varicose veins   [] Non-healing ulcers Pulmonary:   [] Uses home oxygen   [] Productive cough   [] Hemoptysis   [] Wheeze  []   COPD   [] Asthma Neurologic:  [] Dizziness  [] Blackouts   [] Seizures   [] History of stroke   [] History of TIA  [] Aphasia   [] Temporary blindness   [] Dysphagia   [] Weakness or numbness in arms   [] Weakness or numbness in legs Musculoskeletal:  [x] Arthritis   [] Joint swelling   [x] Joint pain   [] Low back pain Hematologic:  [] Easy bruising  [] Easy bleeding   [] Hypercoagulable state   [x] Anemic  [] Hepatitis Gastrointestinal:  [] Blood in stool   [] Vomiting blood  [] Gastroesophageal reflux/heartburn   [] Abdominal pain Genitourinary:  [x] Chronic kidney disease   [] Difficult urination  [] Frequent urination  [] Burning with urination   [] Hematuria Skin:  [] Rashes   [] Ulcers   [] Wounds Psychological:  [] History of anxiety   []  History of major depression.    Physical Exam BP (!) 155/85 (BP Location: Right Arm)   Pulse 60   Resp 16   Ht 5'  (1.524 m)   Wt 137 lb 9.6 oz (62.4 kg)   BMI 26.87 kg/m  Gen:  WD/WN, NAD Head: [place]/AT, No temporalis wasting.  Ear/Nose/Throat: Hearing grossly intact, nares w/o erythema or drainage, oropharynx w/o Erythema/Exudate Eyes: Conjunctiva clear, sclera non-icteric  Neck: trachea midline.  No JVD.  Pulmonary:  Good air movement, respirations not labored, no use of accessory muscles  Cardiac: RRR, no JVD Vascular: Allen's test normal bilaterally Vessel Right Left  Radial Palpable Palpable                                    Musculoskeletal: M/S 5/5 throughout.  Extremities without ischemic changes.  No deformity or atrophy.  Mild lower extremity edema. Neurologic: Sensation grossly intact in extremities.  Symmetrical.  Speech is fluent. Motor exam as listed above. Psychiatric: Judgment intact, Mood & affect appropriate for pt's clinical situation. Dermatologic: No rashes or ulcers noted.  No cellulitis or open wounds.    Radiology No results found.  Labs No results found for this or any previous visit (from the past 2160 hour(s)).  Assessment/Plan:  Thoracic aortic aneurysm without rupture (HCC) 5.7 cm at last check.  Followed by Dr. Sammuel Hines at [facility].  Essential hypertension Likely an underlying cause of her renal insufficiency and blood pressure control important in reducing the progression of atherosclerotic disease and aneurysmal growth. On appropriate oral medications.   CKD (chronic kidney disease), stage IV (HCC) The patient had a vein mapping today which shows no adequate superficial vein for fistula creation in either upper extremity.  Given the fact that she is only at stage IV chronic kidney disease and does not have dialysis needs in the next few weeks, we would generally defer graft placement until dialysis is eminent.  I have reached out to her nephrologist to make sure that this is acceptable and he feels that she does have a little time before she would start  dialysis so we will hold off on any access creation at this point.  I will await to hear word from her nephrologist that she is nearing dialysis needs and at that time we will schedule her for a left arm AV graft as she is right-hand dominant.      Leotis Pain 03/15/2020, 12:09 PM   This note was created with Dragon medical transcription system.  Any errors from dictation are unintentional.

## 2020-03-15 NOTE — Patient Instructions (Signed)
Vascular Access for Hemodialysis        A vascular access is a connection to the blood inside your blood vessels that allows blood to be easily removed from your body and returned to your body during kidney dialysis (hemodialysis). Hemodialysis is a procedure in which a machine outside of the body filters the blood of a person whose kidneys are no longer working properly. There are three types of vascular accesses:  Arteriovenous fistula (AVF). This is a connection between an artery and a vein (usually in the arm) that is made by sewing them together. Blood in the artery flows directly into the vein, causing it to get larger over time. This makes it easier for the vein to be used for hemodialysis. An arteriovenous fistula takes 1-6 months to develop after surgery.  Arteriovenous graft (AVG). This is a connection between an artery and a vein in the arm that is made with a tube. An arteriovenous graft can be used within 2-3 weeks of surgery.  Venous catheter. This is a thin, flexible tube that is placed in a large vein (usually in the neck, chest, or groin). A venous catheter for hemodialysis contains two tubes that come out of the skin. A venous catheter can be used right away. It is usually used as a temporary access if you need hemodialysis before a fistula or graft has developed, or if kidney failure is sudden (acute) and likely to improve without the need for [facility]-term dialysis. It may also be used as a permanent access if a fistula or graft cannot be created. Which type of access is best for me? The type of access that is best for you depends on the size and strength of your veins, your age, and any other health problems that you have, such as diabetes. An ultrasound test may be used to look at your veins to help make this decision. A fistula is usually the preferred type of access. It can last several years and is less likely than the other types of accesses to become infected or to cause a blood  clot within a blood vessel (thrombosis). However, a fistula is not an option for everyone. If your veins are not the right size or if the fistula does not develop properly, a graft may be used instead. Grafts require you to have strong veins. If your veins are not strong enough for a graft, a catheter may be used. Catheters are more likely than fistulas and grafts to become infected or to have a thrombosis. Sometimes, only one type of access is an option. Your health care provider will help you determine which type of access is best for you. How is a vascular access used? The way that the access is used depends on the type of access:  If the access is a fistula or graft, two needles are inserted through the skin into the access before each hemodialysis session. Blood leaves the body through one of the needles and travels through a tube to the hemodialysis machine (dialyzer). Then it flows through another tube and returns to the body through the second needle.  If the access is a catheter, one tube is connected directly to the tube that leads to the dialyzer, and the other tube is connected to a tube that leads away from the dialyzer. Blood leaves the body through one tube and returns to the body through the other. What problems can occur with vascular access?  A blood clot within a blood vessel (thrombosis).  Thrombosis can lead to a narrowing of a blood vessel (stenosis). If thrombosis occurs frequently, another access site may be created as a backup.  Infection.  Heart enlargement (cardiomegaly) and heart failure. Changes in blood flow may cause an increase in blood pressure or heart rate, making your heart work harder to pump blood. These problems are most likely to occur with a venous catheter and least likely to occur with an arteriovenous fistula. How do I care for my vascular access?  Wear a medical alert bracelet. In case of an emergency, this bracelet tells health care providers that you  are a dialysis patient and allows them to care for your veins appropriately. If you have a graft or fistula:  A "bruit" is a noise that is heard with a stethoscope, and a "thrill" is a vibration that is felt over the graft or fistula. The presence of the bruit and thrill indicates that the access is working. You will be taught to feel for the thrill each day. If this is not felt, the access may be clotted. Call your health care provider.  Keep your arm straight and raised (elevated) above your heart while the access site is healing.  You may freely use the arm where your vascular access is located after the site heals. Keep the following in mind: ? Avoid pressure on the arm. ? Avoid lifting heavy objects with the arm. ? Avoid sleeping on the arm. ? Avoid wearing tight-sleeved shirts or jewelry around the graft or fistula.  Do not allow blood pressure monitoring or needle punctures on the side where the graft or fistula is located.  With permission from your health care provider, you may do exercises to help with blood flow through a fistula. These exercises involve squeezing a rubber ball or other soft objects as instructed.  Wash your access site according to directions from your health care provider. If you have a venous catheter:  Keep the insertion site clean and dry at all times.  If you are told you can shower after the site heals, use a protective covering over the catheter to keep it dry.  Follow directions from your health care provider for bandage (dressing) changes.  Ask your health care provider what activities are safe for you. You may be restricted from lifting or making repetitive arm movements on the side with the catheter. Contact a health care provider if:  Swelling around the graft or fistula gets worse.  You develop new pain.  Your catheter gets damaged. Get help right away if:  You have pain, numbness, an unusual pale skin color, or blue fingers or sores at  the tips of your fingers in the hand on the side of your fistula.  You have chills.  You have a fever.  You have pus or other fluid (drainage) at the vascular access site.  You develop skin redness or red streaking on the skin around, above, or below the vascular access.  You have bleeding at the vascular access that cannot be easily controlled.  Your catheter gets pulled out of place.  You feel your heart racing or skipping beats.  You have chest pain. Summary  A vascular access is a connection to the blood inside your blood vessels that allows blood to be easily removed from your body and returned to your body during kidney dialysis (hemodialysis).  There are three types of vascular accesses.The type of access that is best for you depends on the size and strength of your  veins, your age, and any other health problems that you have, such as diabetes.  A fistula is usually the preferred type of access, although it is not an option for everyone. It can last several years and is less likely than the other types of accesses to become infected or to cause a blood clot within a blood vessel (thrombosis).  Wear a medical alert bracelet. In case of an emergency, this tells health care providers that you are a dialysis patient. This information is not intended to replace advice given to you by your health care provider. Make sure you discuss any questions you have with your health care provider. Document Revised: 01/20/2019 Document Reviewed: 10/26/2016 Elsevier Patient Education  [employer].

## 2020-04-08 ENCOUNTER — Other Ambulatory Visit: Payer: Self-pay | Admitting: Nephrology

## 2020-04-08 ENCOUNTER — Telehealth: Payer: Self-pay

## 2020-04-08 ENCOUNTER — Other Ambulatory Visit: Payer: Self-pay

## 2020-04-08 NOTE — Telephone Encounter (Signed)
PT is no longer taking Irbesartan nor Olmesa Medox PT taking different meds

## 2020-04-15 ENCOUNTER — Encounter: Payer: Self-pay | Admitting: Nurse Practitioner

## 2020-04-15 ENCOUNTER — Ambulatory Visit (INDEPENDENT_AMBULATORY_CARE_PROVIDER_SITE_OTHER): Payer: PPO | Admitting: Nurse Practitioner

## 2020-04-15 ENCOUNTER — Other Ambulatory Visit: Payer: Self-pay

## 2020-04-15 VITALS — BP 147/85 | HR 60 | Temp 97.5°F | Resp 16 | Ht 60.0 in | Wt 143.4 lb

## 2020-04-15 DIAGNOSIS — I712 Thoracic aortic aneurysm, without rupture, unspecified: Secondary | ICD-10-CM

## 2020-04-15 DIAGNOSIS — N184 Chronic kidney disease, stage 4 (severe): Secondary | ICD-10-CM | POA: Diagnosis not present

## 2020-04-15 DIAGNOSIS — I10 Essential (primary) hypertension: Secondary | ICD-10-CM

## 2020-04-15 MED ORDER — AMLODIPINE BESYLATE 10 MG PO TABS: 10.0000 mg | ORAL_TABLET | Freq: Every day | ORAL | 2 refills | Status: DC

## 2020-04-15 NOTE — Progress Notes (Signed)
[facility] [place], [place] [address]  Internal MEDICINE  Office Visit Note  Patient Name: Elizabeth Roy  [id number]  [id number]  Date of Service: 04/24/2020  Chief Complaint  Patient presents with  . Follow-up  . Hypertension    The patient is here for routine follow up. The blood pressure is generally well controlled. Needs refills of amlodipine. She is consistently followed by nephrology. Most recent labs indicate declining renal functions. They have discussed hemodialysis with her and she is currently in process of deciding best course of action for her. She also has thoracic abdominal aortic aneurysm which is monitored per vein and vascular. Currently this is stable.       Current Medication: Outpatient Encounter Medications as of 04/15/2020  Medication Sig  . amLODipine (NORVASC) 10 MG tablet Take 1 tablet (10 mg total) by mouth daily.  . bisoprolol (ZEBETA) 10 MG tablet Take 1 tablet (10 mg total) by mouth daily.  . ergocalciferol (VITAMIN D2) 1.25 MG (50000 UT) capsule Take by mouth.  . Olopatadine HCl 0.2 % SOLN Place 1 drop into both eyes daily.  . sodium bicarbonate 650 MG tablet Take by mouth.  . [DISCONTINUED] amLODipine (NORVASC) 10 MG tablet Take 1 tablet (10 mg total) by mouth daily.  . furosemide (LASIX) 20 MG tablet Take 0.5 tablets (10 mg total) by mouth daily.   No facility-administered encounter medications on file as of 04/15/2020.    Surgical History: Past Surgical History:  Procedure Laterality Date  . MOUTH SURGERY      Medical History: Past Medical History:  Diagnosis Date  . Arthritis   . Hypertension   . Renal disorder     Family History: Family History  Problem Relation Age of Onset  . Hypertension Mother   . Intellectual disability Sister   . Stroke Sister     Social History   Socioeconomic History  . Marital status: Widowed    Spouse name: Not on file  . Number of children: Not on file  . Years of  education: Not on file  . Highest education level: Not on file  Occupational History  . Not on file  Tobacco Use  . Smoking status: Former Smoker    Types: Cigarettes  . Smokeless tobacco: Never Used  Substance and Sexual Activity  . Alcohol use: Never  . Drug use: No  . Sexual activity: Not on file  Other Topics Concern  . Not on file  Social History Narrative  . Not on file   Social Determinants of Health   Financial Resource Strain:   . Difficulty of Paying Living Expenses:   Food Insecurity:   . Worried About Charity fundraiser in the Last Year:   . Arboriculturist in the Last Year:   Transportation Needs:   . Film/video editor (Medical):   Marland Kitchen Lack of Transportation (Non-Medical):   Physical Activity:   . Days of Exercise per Week:   . Minutes of Exercise per Session:   Stress:   . Feeling of Stress :   Social Connections:   . Frequency of Communication with Friends and Family:   . Frequency of Social Gatherings with Friends and Family:   . Attends Religious Services:   . Active Member of Clubs or Organizations:   . Attends Archivist Meetings:   Marland Kitchen Marital Status:   Intimate Partner Violence:   . Fear of Current or Ex-Partner:   . Emotionally Abused:   .  Physically Abused:   . Sexually Abused:       Review of Systems  Constitutional: Positive for activity change and fatigue. Negative for chills and fever.       Improved fatigue over past few months.   HENT: Negative for hearing loss, rhinorrhea and tinnitus.   Respiratory: Positive for shortness of breath. Negative for wheezing.        Improved shortness of breath with activity.  Cardiovascular: Negative for chest pain and palpitations.       Stable blood pressure.   Gastrointestinal: Positive for abdominal pain. Negative for constipation, nausea and vomiting.  Endocrine: Negative for cold intolerance, heat intolerance, polydipsia and polyuria.  Musculoskeletal: Positive for back pain.  Negative for joint swelling.  Skin: Negative for rash and wound.  Allergic/Immunologic: Negative for environmental allergies.  Neurological: Negative for dizziness and weakness.  Hematological: Negative for adenopathy.  Psychiatric/Behavioral: The patient is nervous/anxious.     Today's Vitals   04/15/20 1535  BP: (!) 147/85  Pulse: 60  Resp: 16  Temp: (!) 97.5 F (36.4 C)  SpO2: 98%  Weight: 143 lb 6.4 oz (65 kg)  Height: 5' (1.524 m)   Body mass index is 28.01 kg/m.  Physical Exam Vitals and nursing note reviewed.  Constitutional:      General: She is not in acute distress.    Appearance: Normal appearance. She is well-developed. She is not diaphoretic.  HENT:     Head: Normocephalic and atraumatic.     Mouth/Throat:     Pharynx: No oropharyngeal exudate.  Eyes:     Pupils: Pupils are equal, round, and reactive to light.  Neck:     Thyroid: No thyromegaly.     Vascular: No carotid bruit or JVD.     Trachea: No tracheal deviation.  Cardiovascular:     Rate and Rhythm: Normal rate and regular rhythm.     Heart sounds: Normal heart sounds. No murmur heard.  No friction rub. No gallop.   Pulmonary:     Effort: Pulmonary effort is normal. No respiratory distress.     Breath sounds: Normal breath sounds. No wheezing or rales.  Chest:     Chest wall: No tenderness.  Abdominal:     Palpations: Abdomen is soft.  Musculoskeletal:        General: Normal range of motion.     Cervical back: Normal range of motion and neck supple.  Lymphadenopathy:     Cervical: No cervical adenopathy.  Skin:    General: Skin is warm and dry.  Neurological:     General: No focal deficit present.     Mental Status: She is alert and oriented to person, place, and time.     Cranial Nerves: No cranial nerve deficit.  Psychiatric:        Mood and Affect: Mood normal.        Behavior: Behavior normal.        Thought Content: Thought content normal.        Judgment: Judgment normal.     Assessment/Plan:  1. Essential hypertension Generally stable. Continue blood pressure medication as prescribed  - amLODipine (NORVASC) 10 MG tablet; Take 1 tablet (10 mg total) by mouth daily.  Dispense: 30 tablet; Refill: 2  2. CKD (chronic kidney disease), stage IV (HCC) Regular visits with nephrology as scheduled.   3. Thoracic aortic aneurysm without rupture (HCC) Regular visits with vein and vascular as scheduled.   General Counseling: Glenda verbalizes understanding of the  findings of todays visit and agrees with plan of treatment. I have discussed any further diagnostic evaluation that may be needed or ordered today. We also reviewed her medications today. she has been encouraged to call the office with any questions or concerns that should arise related to todays visit.   Hypertension Counseling:   The following hypertensive lifestyle modification were recommended and discussed:  1. Limiting alcohol intake to less than 1 oz/day of ethanol:(24 oz of beer or 8 oz of wine or 2 oz of 100-proof whiskey). 2. Take baby ASA 81 mg daily. 3. Importance of regular aerobic exercise and losing weight. 4. Reduce dietary saturated fat and cholesterol intake for overall cardiovascular health. 5. Maintaining adequate dietary potassium, calcium, and magnesium intake. 6. Regular monitoring of the blood pressure. 7. Reduce sodium intake to less than 100 mmol/day (less than 2.3 gm of sodium or less than 6 gm of sodium choride)   This patient was seen by [place] with Dr Lavera Guise as a part of collaborative care agreement  Meds ordered this encounter  Medications  . amLODipine (NORVASC) 10 MG tablet    Sig: Take 1 tablet (10 mg total) by mouth daily.    Dispense:  30 tablet    Refill:  2    Order Specific Question:   Supervising Provider    Answer:   Lavera Guise [[id number]]    Total time spent: 25 Minutes   Time spent includes review of chart, medications, test  results, and follow up plan with the patient.      Dr Lavera Guise Internal medicine

## 2020-04-26 DIAGNOSIS — I716 Thoracoabdominal aortic aneurysm, without rupture: Secondary | ICD-10-CM | POA: Diagnosis not present

## 2020-04-26 DIAGNOSIS — D631 Anemia in chronic kidney disease: Secondary | ICD-10-CM | POA: Diagnosis not present

## 2020-04-26 DIAGNOSIS — R600 Localized edema: Secondary | ICD-10-CM | POA: Insufficient documentation

## 2020-04-26 DIAGNOSIS — N184 Chronic kidney disease, stage 4 (severe): Secondary | ICD-10-CM | POA: Diagnosis not present

## 2020-04-26 DIAGNOSIS — E210 Primary hyperparathyroidism: Secondary | ICD-10-CM | POA: Diagnosis not present

## 2020-04-26 DIAGNOSIS — R809 Proteinuria, unspecified: Secondary | ICD-10-CM | POA: Diagnosis not present

## 2020-04-26 DIAGNOSIS — I10 Essential (primary) hypertension: Secondary | ICD-10-CM | POA: Diagnosis not present

## 2020-05-16 DIAGNOSIS — M7041 Prepatellar bursitis, right knee: Secondary | ICD-10-CM | POA: Diagnosis not present

## 2020-05-17 DIAGNOSIS — M7041 Prepatellar bursitis, right knee: Secondary | ICD-10-CM | POA: Diagnosis not present

## 2020-05-23 DIAGNOSIS — M7041 Prepatellar bursitis, right knee: Secondary | ICD-10-CM | POA: Insufficient documentation

## 2020-05-30 DIAGNOSIS — M7041 Prepatellar bursitis, right knee: Secondary | ICD-10-CM | POA: Diagnosis not present

## 2020-06-16 DIAGNOSIS — I7100 Dissection of unspecified site of aorta: Secondary | ICD-10-CM | POA: Diagnosis not present

## 2020-06-16 DIAGNOSIS — I716 Thoracoabdominal aortic aneurysm, without rupture: Secondary | ICD-10-CM | POA: Diagnosis not present

## 2020-06-16 DIAGNOSIS — J9811 Atelectasis: Secondary | ICD-10-CM | POA: Diagnosis not present

## 2020-06-16 DIAGNOSIS — N281 Cyst of kidney, acquired: Secondary | ICD-10-CM | POA: Diagnosis not present

## 2020-07-11 ENCOUNTER — Other Ambulatory Visit: Payer: Self-pay

## 2020-07-11 DIAGNOSIS — I10 Essential (primary) hypertension: Secondary | ICD-10-CM

## 2020-07-11 MED ORDER — AMLODIPINE BESYLATE 10 MG PO TABS: 10.0000 mg | ORAL_TABLET | Freq: Every day | ORAL | 2 refills | Status: DC

## 2020-07-20 DIAGNOSIS — N184 Chronic kidney disease, stage 4 (severe): Secondary | ICD-10-CM | POA: Diagnosis not present

## 2020-07-20 DIAGNOSIS — I10 Essential (primary) hypertension: Secondary | ICD-10-CM | POA: Diagnosis not present

## 2020-07-22 ENCOUNTER — Other Ambulatory Visit: Payer: Self-pay

## 2020-07-22 DIAGNOSIS — I10 Essential (primary) hypertension: Secondary | ICD-10-CM

## 2020-07-22 MED ORDER — BISOPROLOL FUMARATE 10 MG PO TABS: 10.0000 mg | ORAL_TABLET | Freq: Every day | ORAL | 1 refills | Status: DC

## 2020-07-27 DIAGNOSIS — D631 Anemia in chronic kidney disease: Secondary | ICD-10-CM | POA: Diagnosis not present

## 2020-07-27 DIAGNOSIS — R809 Proteinuria, unspecified: Secondary | ICD-10-CM | POA: Diagnosis not present

## 2020-07-27 DIAGNOSIS — N185 Chronic kidney disease, stage 5: Secondary | ICD-10-CM | POA: Diagnosis not present

## 2020-07-27 DIAGNOSIS — I716 Thoracoabdominal aortic aneurysm, without rupture: Secondary | ICD-10-CM | POA: Diagnosis not present

## 2020-07-27 DIAGNOSIS — R600 Localized edema: Secondary | ICD-10-CM | POA: Diagnosis not present

## 2020-07-27 DIAGNOSIS — I10 Essential (primary) hypertension: Secondary | ICD-10-CM | POA: Diagnosis not present

## 2020-07-27 DIAGNOSIS — E210 Primary hyperparathyroidism: Secondary | ICD-10-CM | POA: Diagnosis not present

## 2020-08-17 DIAGNOSIS — N184 Chronic kidney disease, stage 4 (severe): Secondary | ICD-10-CM | POA: Diagnosis not present

## 2020-08-18 ENCOUNTER — Other Ambulatory Visit: Payer: Self-pay

## 2020-08-18 ENCOUNTER — Encounter: Payer: Self-pay | Admitting: Nurse Practitioner

## 2020-08-18 ENCOUNTER — Ambulatory Visit (INDEPENDENT_AMBULATORY_CARE_PROVIDER_SITE_OTHER): Payer: PPO | Admitting: Nurse Practitioner

## 2020-08-18 VITALS — BP 139/93 | HR 62 | Temp 97.5°F | Resp 16 | Ht 60.0 in | Wt 146.8 lb

## 2020-08-18 DIAGNOSIS — N2581 Secondary hyperparathyroidism of renal origin: Secondary | ICD-10-CM

## 2020-08-18 DIAGNOSIS — N184 Chronic kidney disease, stage 4 (severe): Secondary | ICD-10-CM

## 2020-08-18 DIAGNOSIS — I10 Essential (primary) hypertension: Secondary | ICD-10-CM | POA: Diagnosis not present

## 2020-08-18 DIAGNOSIS — Z0001 Encounter for general adult medical examination with abnormal findings: Secondary | ICD-10-CM | POA: Diagnosis not present

## 2020-08-18 DIAGNOSIS — I712 Thoracic aortic aneurysm, without rupture, unspecified: Secondary | ICD-10-CM

## 2020-08-18 DIAGNOSIS — R300 Dysuria: Secondary | ICD-10-CM | POA: Diagnosis not present

## 2020-08-18 LAB — POCT URINALYSIS DIPSTICK
Bilirubin, UA: NEGATIVE
Glucose, UA: NEGATIVE
Ketones, UA: NEGATIVE
Leukocytes, UA: NEGATIVE
Nitrite, UA: NEGATIVE
Protein, UA: POSITIVE — AB
Spec Grav, UA: 1.015 (ref 1.010–1.025)
Urobilinogen, UA: 0.2 E.U./dL
pH, UA: 5 (ref 5.0–8.0)

## 2020-08-18 NOTE — Progress Notes (Signed)
[facility] [place],  [address]  Internal MEDICINE  Office Visit Note  Patient Name: Elizabeth Roy  [id number]  [id number]  Date of Service: 09/10/2020   Pt is here for routine health maintenance examination  Chief Complaint  Patient presents with  . Medicare Wellness  . Hypertension  . Quality Metric Gaps    covid,colonoscopy     The patient is here for health maintenance exam. She states that she has noted a strong odor to he urine. [name] is she has infection or if this is related to urinary tract infection or related to failing kidneys. She is attending classes about peritoneal dialysis. She is afraid to do this type of dialysis as she does not feel comfortable with hooking things up on her own. She does have a AAA which is approximately 7.3cm in diameter and growing in size. The vascular surgeon will not repair the aneurysm until the patient has started dialysis treatment.  Reviewing labs done per nephrology in October, 2021, she has elevated calcium levels and very high intact parathyroid hormone. She mentions she was to be referred to endocrinology, but has never seen one.  She reports fatigue.  She is due to have colon cancer screen.    Current Medication: Outpatient Encounter Medications as of 08/18/2020  Medication Sig  . amLODipine (NORVASC) 10 MG tablet Take 1 tablet (10 mg total) by mouth daily.  . bisoprolol (ZEBETA) 10 MG tablet Take 1 tablet (10 mg total) by mouth daily. (Patient not taking: Reported on 08/26/2020)  . ergocalciferol (VITAMIN D2) 1.25 MG (50000 UT) capsule Take by mouth.  . Olopatadine HCl 0.2 % SOLN Place 1 drop into both eyes daily.  . sodium bicarbonate 650 MG tablet Take by mouth.  . furosemide (LASIX) 20 MG tablet Take 0.5 tablets (10 mg total) by mouth daily.  . [DISCONTINUED] amLODipine (NORVASC) 10 MG tablet Take 1 tablet (10 mg total) by mouth daily.  . [DISCONTINUED] bisoprolol (ZEBETA) 10 MG tablet Take  1 tablet (10 mg total) by mouth daily.   No facility-administered encounter medications on file as of 08/18/2020.    Surgical History: Past Surgical History:  Procedure Laterality Date  . MOUTH SURGERY      Medical History: Past Medical History:  Diagnosis Date  . Arthritis   . Hypertension   . Renal disorder     Family History: Family History  Problem Relation Age of Onset  . Hypertension Mother   . Intellectual disability Sister   . Stroke Sister       Review of Systems  Constitutional: Positive for activity change and fatigue. Negative for chills and fever.  HENT: Negative for congestion, hearing loss, rhinorrhea and tinnitus.   Respiratory: Positive for shortness of breath. Negative for wheezing.   Cardiovascular: Negative for chest pain and palpitations.       Stable blood pressure.   Gastrointestinal: Negative for abdominal pain, constipation, nausea and vomiting.  Endocrine: Negative for cold intolerance, heat intolerance, polydipsia and polyuria.  Genitourinary: Positive for decreased urine volume and dysuria. Negative for frequency, pelvic pain and urgency.  Musculoskeletal: Positive for back pain and myalgias. Negative for joint swelling.  Skin: Negative for rash and wound.  Neurological: Negative for dizziness, weakness and headaches.  Hematological: Negative for adenopathy.  Psychiatric/Behavioral: The patient is nervous/anxious.      Today's Vitals   08/18/20 1442  BP: (!) 139/93  Pulse: 62  Resp: 16  Temp: (!) 97.5 F (36.4 C)  SpO2: 97%  Weight: 146 lb 12.8 oz (66.6 kg)  Height: 5' (1.524 m)   Body mass index is 28.67 kg/m.  Physical Exam Vitals and nursing note reviewed.  Constitutional:      General: She is not in acute distress.    Appearance: Normal appearance. She is well-developed. She is not diaphoretic.  HENT:     Head: Normocephalic and atraumatic.     Right Ear: Tympanic membrane normal.     Left Ear: Tympanic membrane  normal.     Nose: Nose normal.     Mouth/Throat:     Pharynx: No oropharyngeal exudate.  Eyes:     Pupils: Pupils are equal, round, and reactive to light.  Neck:     Thyroid: No thyromegaly.     Vascular: No carotid bruit or JVD.     Trachea: No tracheal deviation.  Cardiovascular:     Rate and Rhythm: Normal rate and regular rhythm.     Pulses: Normal pulses.     Heart sounds: Normal heart sounds. No murmur heard.  No friction rub. No gallop.   Pulmonary:     Effort: Pulmonary effort is normal. No respiratory distress.     Breath sounds: Normal breath sounds. No wheezing or rales.  Chest:     Chest wall: No tenderness.  Abdominal:     General: Bowel sounds are normal.     Palpations: Abdomen is soft.     Tenderness: There is abdominal tenderness.     Comments: Mid generalized abdominal tenderness with palpation.   Genitourinary:    Comments: Urine sample positive for protein and trace blood.  Musculoskeletal:        General: Normal range of motion.     Cervical back: Normal range of motion and neck supple.  Lymphadenopathy:     Cervical: No cervical adenopathy.  Skin:    General: Skin is warm and dry.  Neurological:     General: No focal deficit present.     Mental Status: She is alert and oriented to person, place, and time.     Cranial Nerves: No cranial nerve deficit.  Psychiatric:        Mood and Affect: Mood normal.        Behavior: Behavior normal.        Thought Content: Thought content normal.        Judgment: Judgment normal.    Depression screen [facility] 2/9 08/18/2020 12/15/2019 09/14/2019 09/04/2019 05/11/2019  Decreased Interest 0 0 0 0 0  Down, Depressed, Hopeless 0 0 0 0 0  PHQ - 2 Score 0 0 0 0 0    Functional Status Survey: Is the patient deaf or have difficulty hearing?: No Does the patient have difficulty seeing, even when wearing glasses/contacts?: Yes Does the patient have difficulty concentrating, remembering, or making decisions?: No Does the  patient have difficulty walking or climbing stairs?: Yes Does the patient have difficulty dressing or bathing?: No Does the patient have difficulty doing errands alone such as visiting a doctor's office or shopping?: No  MMSE - [place] Exam 08/18/2020 09/04/2019  Orientation to time 5 5  Orientation to Place 5 5  Registration 3 3  Attention/ Calculation 5 5  Recall 3 3  Language- name 2 objects 2 2  Language- repeat 1 1  Language- follow 3 step command 3 3  Language- read & follow direction 1 1  Write a sentence 1 1  Copy design 1 1  Total score  30 30    Fall Risk  08/18/2020 12/15/2019 09/14/2019 09/04/2019 05/11/2019  Falls in the past year? 0 0 0 0 0  Number falls in past yr: - - - - -  Injury with Fall? - - - - -      LABS: Recent Results (from the past 2160 hour(s))  CULTURE, URINE COMPREHENSIVE     Status: None   Collection Time: 08/18/20  3:00 PM   Specimen: Urine   Urine  Result Value Ref Range   Urine Culture, Comprehensive Final report    Organism ID, Bacteria Comment     Comment: Mixed urogenital flora 10,000-25,000 colony forming units per mL   UA/M w/rflx Culture, Routine     Status: Abnormal   Collection Time: 08/18/20  3:00 PM   Specimen: Urine   Urine  Result Value Ref Range   Specific Gravity, UA 1.012 1.005 - 1.030   pH, UA 6.0 5.0 - 7.5   Color, UA Yellow Yellow   Appearance Ur Clear Clear   Leukocytes,UA Negative Negative   Protein,UA 3+ (A) Negative/Trace   Glucose, UA Negative Negative   Ketones, UA Negative Negative   RBC, UA Negative Negative   Bilirubin, UA Negative Negative   Urobilinogen, Ur 0.2 0.2 - 1.0 mg/dL   Nitrite, UA Negative Negative   Microscopic Examination See below:     Comment: Microscopic was indicated and was performed.   Urinalysis Reflex Comment     Comment: This specimen will not reflex to a Urine Culture.  Microscopic Examination     Status: None   Collection Time: 08/18/20  3:00 PM   Urine  Result  Value Ref Range   WBC, UA None seen 0 - 5 /hpf   RBC None seen 0 - 2 /hpf   Epithelial Cells (non renal) 0-10 0 - 10 /hpf   Casts None seen None seen /lpf   Bacteria, UA None seen None seen/Few  POCT Urinalysis Dipstick     Status: Abnormal   Collection Time: 08/18/20  4:17 PM  Result Value Ref Range   Color, UA     Clarity, UA     Glucose, UA Negative Negative   Bilirubin, UA negative    Ketones, UA negative    Spec Grav, UA 1.015 1.010 - 1.025   Blood, UA trace    pH, UA 5.0 5.0 - 8.0   Protein, UA Positive (A) Negative   Urobilinogen, UA 0.2 0.2 or 1.0 E.U./dL   Nitrite, UA negative    Leukocytes, UA Negative Negative   Appearance     Odor      Assessment/Plan: 1. Encounter for general adult medical examination with abnormal findings Annual health maintenance exam today.   2. CKD (chronic kidney disease), stage IV [facility]) Patient is seeing nephrology routinely. Currently discussing initiation of peritoneal dialysis.   3. Hyperparathyroidism due to renal insufficiency [facility]) Reviewed labs done per nephrology 07/2020 showing elevated calcium and parathyroid hormone levels. Will refer to endocrinology for further evaluation.  - Ambulatory referral to Endocrinology  4. Hypercalcemia Reviewed labs done per nephrology 07/2020 showing elevated calcium and parathyroid hormone levels. Will refer to endocrinology for further evaluation.  - Ambulatory referral to Endocrinology  5. Essential hypertension Stable. Continue bp medication as prescribed   6. Thoracic aortic aneurysm without rupture [facility]) Patient continues to see vein and vascular provider.   7. Dysuria U/a positive for protein and trace blood. Will send for culture and sensitivity and treat as indicated.  -  POCT Urinalysis Dipstick - CULTURE, URINE COMPREHENSIVE - UA/M w/rflx Culture, Routine  General Counseling: Felicia verbalizes understanding of the findings of todays visit and agrees with plan of treatment. I  have discussed any further diagnostic evaluation that may be needed or ordered today. We also reviewed her medications today. she has been encouraged to call the office with any questions or concerns that should arise related to todays visit.    Counseling:  This patient was seen by Leretha Pol FNP Collaboration with Dr Lavera Guise as a part of collaborative care agreement  Orders Placed This Encounter  Procedures  . CULTURE, URINE COMPREHENSIVE  . Microscopic Examination  . UA/M w/rflx Culture, Routine  . Ambulatory referral to Endocrinology  . POCT Urinalysis Dipstick    Total time spent: 85 Minutes  Time spent includes review of chart, medications, test results, and follow up plan with the patient.     Lavera Guise, MD  Internal Medicine

## 2020-08-18 NOTE — Progress Notes (Signed)
Patient with stage IV chronic kidney disease. Sent urine for culture and sensitivity and will trest as indicated.

## 2020-08-19 LAB — MICROSCOPIC EXAMINATION
Bacteria, UA: NONE SEEN
Casts: NONE SEEN /lpf
RBC, Urine: NONE SEEN /hpf (ref 0–2)
WBC, UA: NONE SEEN /hpf (ref 0–5)

## 2020-08-19 LAB — UA/M W/RFLX CULTURE, ROUTINE
Bilirubin, UA: NEGATIVE
Glucose, UA: NEGATIVE
Ketones, UA: NEGATIVE
Leukocytes,UA: NEGATIVE
Nitrite, UA: NEGATIVE
RBC, UA: NEGATIVE
Specific Gravity, UA: 1.012 (ref 1.005–1.030)
Urobilinogen, Ur: 0.2 mg/dL (ref 0.2–1.0)
pH, UA: 6 (ref 5.0–7.5)

## 2020-08-22 ENCOUNTER — Other Ambulatory Visit: Payer: Self-pay

## 2020-08-22 LAB — CULTURE, URINE COMPREHENSIVE

## 2020-08-26 ENCOUNTER — Telehealth: Payer: Self-pay

## 2020-08-26 ENCOUNTER — Other Ambulatory Visit: Payer: Self-pay

## 2020-08-26 NOTE — Telephone Encounter (Signed)
Spoke with pt, she wants to know the results of her UA from 08/18/20

## 2020-08-30 NOTE — Telephone Encounter (Signed)
Her u/a was positive for protein and trace of blood. This is expected as she does have significant kidney disease .

## 2020-09-02 ENCOUNTER — Other Ambulatory Visit: Payer: Self-pay | Admitting: Nurse Practitioner

## 2020-09-02 ENCOUNTER — Telehealth: Payer: Self-pay

## 2020-09-02 DIAGNOSIS — N760 Acute vaginitis: Secondary | ICD-10-CM

## 2020-09-02 DIAGNOSIS — B9689 Other specified bacterial agents as the cause of diseases classified elsewhere: Secondary | ICD-10-CM

## 2020-09-02 MED ORDER — METRONIDAZOLE 0.75 % VA GEL: 1.0000 | Freq: Every day | VAGINAL | 0 refills | Status: DC

## 2020-09-02 NOTE — Telephone Encounter (Signed)
Most likely, she has bacterial infection. I have sent prescription for metronidazole vaginal gel. She should insert 1 applicatorful vaginally every night for next 7 nights. Please advise against douching. This often makes bacterial or yeast infections worse. Thanks.

## 2020-09-02 NOTE — Telephone Encounter (Signed)
Spoke with pt, she wants to know what is causing the odor in her vagina, and wants to know if she can start douching or if something is causing the odor does she need meds? She stopped douching because she was told to at one of her visits, but wants to restart if it means the odor will be gone, she notices this odor when using the bathroom or even in the bath.

## 2020-09-05 ENCOUNTER — Telehealth: Payer: Self-pay

## 2020-09-05 NOTE — Telephone Encounter (Signed)
Spoke with pt, informed her most likely, she has bacterial infection and that a prescription for metronidazole vaginal gel was sent to the pharmacy. She should insert 1 applicatorful vaginally every night for next 7 nights. Advised against douching and let her know it often makes bacterial or yeast infections worse. Pt aware.

## 2020-09-06 ENCOUNTER — Telehealth: Payer: Self-pay

## 2020-09-07 ENCOUNTER — Other Ambulatory Visit: Payer: Self-pay | Admitting: Nurse Practitioner

## 2020-09-07 DIAGNOSIS — N760 Acute vaginitis: Secondary | ICD-10-CM

## 2020-09-07 DIAGNOSIS — E210 Primary hyperparathyroidism: Secondary | ICD-10-CM | POA: Diagnosis not present

## 2020-09-07 DIAGNOSIS — B9689 Other specified bacterial agents as the cause of diseases classified elsewhere: Secondary | ICD-10-CM

## 2020-09-07 DIAGNOSIS — D631 Anemia in chronic kidney disease: Secondary | ICD-10-CM | POA: Diagnosis not present

## 2020-09-07 DIAGNOSIS — R809 Proteinuria, unspecified: Secondary | ICD-10-CM | POA: Diagnosis not present

## 2020-09-07 DIAGNOSIS — R600 Localized edema: Secondary | ICD-10-CM | POA: Diagnosis not present

## 2020-09-07 DIAGNOSIS — I716 Thoracoabdominal aortic aneurysm, without rupture: Secondary | ICD-10-CM | POA: Diagnosis not present

## 2020-09-07 DIAGNOSIS — I10 Essential (primary) hypertension: Secondary | ICD-10-CM | POA: Diagnosis not present

## 2020-09-07 DIAGNOSIS — N185 Chronic kidney disease, stage 5: Secondary | ICD-10-CM | POA: Diagnosis not present

## 2020-09-07 MED ORDER — METRONIDAZOLE 500 MG PO TABS: 500.0000 mg | ORAL_TABLET | Freq: Two times a day (BID) | ORAL | 0 refills | Status: DC

## 2020-09-07 NOTE — Telephone Encounter (Signed)
Pt.notified

## 2020-09-07 NOTE — Telephone Encounter (Signed)
Changed prescription to metronidazole 500mg  twice daily for next 7 days. Advise her to take with food and to avoid any alcohol intake while taking this medication. Thanks.

## 2020-09-10 DIAGNOSIS — N2581 Secondary hyperparathyroidism of renal origin: Secondary | ICD-10-CM | POA: Insufficient documentation

## 2020-09-15 ENCOUNTER — Other Ambulatory Visit: Payer: Self-pay | Admitting: Nurse Practitioner

## 2020-09-15 DIAGNOSIS — Z1231 Encounter for screening mammogram for malignant neoplasm of breast: Secondary | ICD-10-CM

## 2020-09-29 ENCOUNTER — Telehealth: Payer: Self-pay

## 2020-09-29 NOTE — Telephone Encounter (Signed)
Faxed Cologuard paperwork to [facility].

## 2020-10-18 ENCOUNTER — Other Ambulatory Visit: Payer: Self-pay

## 2020-10-18 ENCOUNTER — Ambulatory Visit
Admission: RE | Admit: 2020-10-18 | Discharge: 2020-10-18 | Disposition: A | Discharge status: Home / Self Care | Payer: PPO | Source: Ambulatory Visit | Attending: Nurse Practitioner | Admitting: Nurse Practitioner

## 2020-10-18 DIAGNOSIS — Z1231 Encounter for screening mammogram for malignant neoplasm of breast: Secondary | ICD-10-CM | POA: Diagnosis not present

## 2020-10-18 NOTE — Progress Notes (Signed)
Additional images recommended.

## 2020-10-19 ENCOUNTER — Other Ambulatory Visit: Payer: Self-pay | Admitting: Nurse Practitioner

## 2020-10-19 DIAGNOSIS — R921 Mammographic calcification found on diagnostic imaging of breast: Secondary | ICD-10-CM

## 2020-10-19 DIAGNOSIS — R928 Other abnormal and inconclusive findings on diagnostic imaging of breast: Secondary | ICD-10-CM

## 2020-10-31 ENCOUNTER — Ambulatory Visit: Payer: PPO

## 2020-11-02 DIAGNOSIS — R809 Proteinuria, unspecified: Secondary | ICD-10-CM | POA: Diagnosis not present

## 2020-11-02 DIAGNOSIS — N185 Chronic kidney disease, stage 5: Secondary | ICD-10-CM | POA: Diagnosis not present

## 2020-11-02 DIAGNOSIS — E210 Primary hyperparathyroidism: Secondary | ICD-10-CM | POA: Diagnosis not present

## 2020-11-02 DIAGNOSIS — I716 Thoracoabdominal aortic aneurysm, without rupture: Secondary | ICD-10-CM | POA: Diagnosis not present

## 2020-11-02 DIAGNOSIS — R600 Localized edema: Secondary | ICD-10-CM | POA: Diagnosis not present

## 2020-11-02 DIAGNOSIS — I10 Essential (primary) hypertension: Secondary | ICD-10-CM | POA: Diagnosis not present

## 2020-11-08 ENCOUNTER — Other Ambulatory Visit: Payer: Self-pay

## 2020-11-08 ENCOUNTER — Other Ambulatory Visit: Payer: Self-pay | Admitting: Nurse Practitioner

## 2020-11-08 ENCOUNTER — Ambulatory Visit
Admission: RE | Admit: 2020-11-08 | Discharge: 2020-11-08 | Disposition: A | Discharge status: Home / Self Care | Payer: PPO | Source: Ambulatory Visit | Attending: Nurse Practitioner | Admitting: Nurse Practitioner

## 2020-11-08 DIAGNOSIS — R921 Mammographic calcification found on diagnostic imaging of breast: Secondary | ICD-10-CM | POA: Diagnosis not present

## 2020-11-08 DIAGNOSIS — R928 Other abnormal and inconclusive findings on diagnostic imaging of breast: Secondary | ICD-10-CM | POA: Insufficient documentation

## 2020-11-08 DIAGNOSIS — R922 Inconclusive mammogram: Secondary | ICD-10-CM | POA: Diagnosis not present

## 2020-11-09 DIAGNOSIS — R809 Proteinuria, unspecified: Secondary | ICD-10-CM | POA: Diagnosis not present

## 2020-11-09 DIAGNOSIS — D631 Anemia in chronic kidney disease: Secondary | ICD-10-CM | POA: Diagnosis not present

## 2020-11-09 DIAGNOSIS — R600 Localized edema: Secondary | ICD-10-CM | POA: Diagnosis not present

## 2020-11-09 DIAGNOSIS — N185 Chronic kidney disease, stage 5: Secondary | ICD-10-CM | POA: Diagnosis not present

## 2020-11-09 DIAGNOSIS — I10 Essential (primary) hypertension: Secondary | ICD-10-CM | POA: Diagnosis not present

## 2020-11-09 DIAGNOSIS — I716 Thoracoabdominal aortic aneurysm, without rupture: Secondary | ICD-10-CM | POA: Diagnosis not present

## 2020-11-09 DIAGNOSIS — E210 Primary hyperparathyroidism: Secondary | ICD-10-CM | POA: Diagnosis not present

## 2020-11-10 ENCOUNTER — Other Ambulatory Visit: Payer: Self-pay | Admitting: Internal Medicine

## 2020-11-10 DIAGNOSIS — R928 Other abnormal and inconclusive findings on diagnostic imaging of breast: Secondary | ICD-10-CM

## 2020-11-10 DIAGNOSIS — R921 Mammographic calcification found on diagnostic imaging of breast: Secondary | ICD-10-CM

## 2020-11-22 ENCOUNTER — Telehealth: Payer: Self-pay

## 2020-11-22 NOTE — Telephone Encounter (Signed)
LMOM to reschedule Friday afternoon appt. 12-16-20

## 2020-11-24 ENCOUNTER — Ambulatory Visit
Admission: RE | Admit: 2020-11-24 | Discharge: 2020-11-24 | Disposition: A | Discharge status: Home / Self Care | Payer: PPO | Source: Ambulatory Visit | Attending: Internal Medicine | Admitting: Internal Medicine

## 2020-11-24 ENCOUNTER — Other Ambulatory Visit: Payer: Self-pay

## 2020-11-24 DIAGNOSIS — R921 Mammographic calcification found on diagnostic imaging of breast: Secondary | ICD-10-CM | POA: Diagnosis not present

## 2020-11-24 DIAGNOSIS — N6022 Fibroadenosis of left breast: Secondary | ICD-10-CM | POA: Diagnosis not present

## 2020-11-24 DIAGNOSIS — R920 Mammographic microcalcification found on diagnostic imaging of breast: Secondary | ICD-10-CM | POA: Diagnosis not present

## 2020-11-24 DIAGNOSIS — R928 Other abnormal and inconclusive findings on diagnostic imaging of breast: Secondary | ICD-10-CM | POA: Insufficient documentation

## 2020-11-24 HISTORY — PX: BREAST BIOPSY: SHX20

## 2020-11-25 LAB — SURGICAL PATHOLOGY

## 2020-11-28 ENCOUNTER — Other Ambulatory Visit: Payer: Self-pay

## 2020-11-28 DIAGNOSIS — I10 Essential (primary) hypertension: Secondary | ICD-10-CM

## 2020-11-28 MED ORDER — AMLODIPINE BESYLATE 10 MG PO TABS: 10.0000 mg | ORAL_TABLET | Freq: Every day | ORAL | 2 refills | Status: DC

## 2020-12-01 NOTE — Progress Notes (Signed)
This resulted to me and she sees you next.

## 2020-12-01 NOTE — Progress Notes (Signed)
Patient seeing you next

## 2020-12-08 DIAGNOSIS — I7100 Dissection of unspecified site of aorta: Secondary | ICD-10-CM | POA: Diagnosis not present

## 2020-12-08 DIAGNOSIS — N189 Chronic kidney disease, unspecified: Secondary | ICD-10-CM | POA: Diagnosis not present

## 2020-12-08 DIAGNOSIS — I716 Thoracoabdominal aortic aneurysm, without rupture: Secondary | ICD-10-CM | POA: Diagnosis not present

## 2020-12-13 DIAGNOSIS — N185 Chronic kidney disease, stage 5: Secondary | ICD-10-CM | POA: Diagnosis not present

## 2020-12-16 ENCOUNTER — Ambulatory Visit: Payer: PPO | Admitting: Hospice and Palliative Medicine

## 2020-12-26 ENCOUNTER — Ambulatory Visit (INDEPENDENT_AMBULATORY_CARE_PROVIDER_SITE_OTHER): Payer: PPO | Admitting: Hospice and Palliative Medicine

## 2020-12-26 ENCOUNTER — Encounter: Payer: Self-pay | Admitting: Hospice and Palliative Medicine

## 2020-12-26 ENCOUNTER — Other Ambulatory Visit: Payer: Self-pay

## 2020-12-26 VITALS — BP 138/90 | HR 62 | Temp 97.3°F | Resp 16 | Ht 59.0 in | Wt 148.8 lb

## 2020-12-26 DIAGNOSIS — I10 Essential (primary) hypertension: Secondary | ICD-10-CM | POA: Diagnosis not present

## 2020-12-26 DIAGNOSIS — R300 Dysuria: Secondary | ICD-10-CM

## 2020-12-26 DIAGNOSIS — N185 Chronic kidney disease, stage 5: Secondary | ICD-10-CM

## 2020-12-26 DIAGNOSIS — I712 Thoracic aortic aneurysm, without rupture, unspecified: Secondary | ICD-10-CM

## 2020-12-26 LAB — POCT URINALYSIS DIPSTICK
Bilirubin, UA: NEGATIVE
Glucose, UA: NEGATIVE
Leukocytes, UA: NEGATIVE
Protein, UA: POSITIVE — AB
Spec Grav, UA: 1.015 (ref 1.010–1.025)
Urobilinogen, UA: 0.2 E.U./dL
pH, UA: 5 (ref 5.0–8.0)

## 2020-12-26 NOTE — Progress Notes (Signed)
[facility] [place], [place] [address]  Internal MEDICINE  Office Visit Note  Patient Name: Elizabeth Roy  [id number]  [id number]  Date of Service: 12/27/2020  Chief Complaint  Patient presents with  . Follow-up    Pt received meds for infection, odor is still there, frequent urination, lower back pain, pt has kidney disease  . Hypertension    HPI Patient is here for routine follow-up Continues to complain of odor in her urine, increased frequency and low back pain, having similar symptoms in November as well as vaginal odor--prescribed Metronidazole gel for presumed BV History of Stage V CKD-followed by nephrology and vascular surgery, plan to start dialysis soon, contemplating PD vs HD at this point Discussed urinary symptoms with nephrologist whom does not feel symptoms are related to CKD and repeat cultures are negative   Current Medication: Outpatient Encounter Medications as of 12/26/2020  Medication Sig  . amLODipine (NORVASC) 10 MG tablet Take 1 tablet (10 mg total) by mouth daily.  . bisoprolol (ZEBETA) 10 MG tablet Take 1 tablet (10 mg total) by mouth daily.  . ergocalciferol (VITAMIN D2) 1.25 MG (50000 UT) capsule Take by mouth.  . Olopatadine HCl 0.2 % SOLN Place 1 drop into both eyes daily.  . sodium bicarbonate 650 MG tablet Take by mouth.  . furosemide (LASIX) 20 MG tablet Take 0.5 tablets (10 mg total) by mouth daily.  . [DISCONTINUED] metroNIDAZOLE (FLAGYL) 500 MG tablet Take 1 tablet (500 mg total) by mouth 2 (two) times daily. (Patient not taking: Reported on 12/26/2020)   No facility-administered encounter medications on file as of 12/26/2020.    Surgical History: Past Surgical History:  Procedure Laterality Date  . BREAST BIOPSY Left 11/24/2020   Affirm bx-"X" clip-path pending  . MOUTH SURGERY      Medical History: Past Medical History:  Diagnosis Date  . Arthritis   . Hypertension   . Renal disorder     Family  History: Family History  Problem Relation Age of Onset  . Hypertension Mother   . Intellectual disability Sister   . Stroke Sister     Social History   Socioeconomic History  . Marital status: Widowed    Spouse name: Not on file  . Number of children: Not on file  . Years of education: Not on file  . Highest education level: Not on file  Occupational History  . Not on file  Tobacco Use  . Smoking status: Former Smoker    Types: Cigarettes  . Smokeless tobacco: Never Used  Substance and Sexual Activity  . Alcohol use: Never  . Drug use: No  . Sexual activity: Not on file  Other Topics Concern  . Not on file  Social History Narrative  . Not on file   Social Determinants of Health   Financial Resource Strain: Not on file  Food Insecurity: Not on file  Transportation Needs: Not on file  Physical Activity: Not on file  Stress: Not on file  Social Connections: Not on file  Intimate Partner Violence: Not on file      Review of Systems  Constitutional: Negative for chills, diaphoresis and fatigue.  HENT: Negative for ear pain, postnasal drip and sinus pressure.   Eyes: Negative for photophobia, discharge, redness, itching and visual disturbance.  Respiratory: Negative for cough, shortness of breath and wheezing.   Cardiovascular: Negative for chest pain, palpitations and leg swelling.  Gastrointestinal: Negative for abdominal pain, constipation, diarrhea, nausea and vomiting.  Genitourinary: Positive for frequency. Negative for dysuria and flank pain.       Odor in urine  Musculoskeletal: Negative for arthralgias, back pain, gait problem and neck pain.  Skin: Negative for color change.  Allergic/Immunologic: Negative for environmental allergies and food allergies.  Neurological: Negative for dizziness and headaches.  Hematological: Does not bruise/bleed easily.  Psychiatric/Behavioral: Negative for agitation, behavioral problems (depression) and hallucinations.     Vital Signs: BP 138/90   Pulse 62   Temp (!) 97.3 F (36.3 C)   Resp 16   Ht '4\' 11"'$  (1.499 m)   Wt 148 lb 12.8 oz (67.5 kg)   SpO2 97%   BMI 30.05 kg/m    Physical Exam Vitals reviewed.  Constitutional:      Appearance: Normal appearance. She is normal weight.  Cardiovascular:     Rate and Rhythm: Normal rate and regular rhythm.     Pulses: Normal pulses.     Heart sounds: Normal heart sounds.  Pulmonary:     Effort: Pulmonary effort is normal.     Breath sounds: Normal breath sounds.  Abdominal:     General: Abdomen is flat.     Palpations: Abdomen is soft.  Musculoskeletal:        General: Normal range of motion.     Cervical back: Normal range of motion.  Skin:    General: Skin is warm.  Neurological:     General: No focal deficit present.     Mental Status: She is alert and oriented to person, place, and time. Mental status is at baseline.  Psychiatric:        Mood and Affect: Mood normal.        Behavior: Behavior normal.        Thought Content: Thought content normal.        Judgment: Judgment normal.    Assessment/Plan: 1. Essential hypertension BP and HR well controlled, continue to monitor  2. CKD (chronic kidney disease), stage V (HCC) Awaiting to shadow a patient undergo PD as this is the best option for her regarding cardiac function, followed closely by nephrology  3. Thoracic aortic aneurysm without rupture [facility]) Aortic dissection 5.7cm--followed closely by vascular surgery, working closely with nephrology as to plans to proceed with internvention  4. Dysuria Likely multi-factorial, no signs of acute infection - POCT Urinalysis Dipstick - CULTURE, URINE COMPREHENSIVE  General Counseling: Elizabeth Roy verbalizes understanding of the findings of todays visit and agrees with plan of treatment. I have discussed any further diagnostic evaluation that may be needed or ordered today. We also reviewed her medications today. she has been encouraged to call  the office with any questions or concerns that should arise related to todays visit.    Orders Placed This Encounter  Procedures  . CULTURE, URINE COMPREHENSIVE  . POCT Urinalysis Dipstick   Time spent: 30 Minutes Time spent includes review of chart, medications, test results and follow-up plan with the patient.  This patient was seen by Theodoro Grist AGNP-C in Collaboration with Dr Lavera Guise as a part of collaborative care agreement     Tanna Furry. Gal Smolinski AGNP-C Internal medicine

## 2020-12-27 ENCOUNTER — Encounter: Payer: Self-pay | Admitting: Hospice and Palliative Medicine

## 2020-12-29 LAB — CULTURE, URINE COMPREHENSIVE

## 2021-01-09 ENCOUNTER — Telehealth: Payer: Self-pay

## 2021-01-09 NOTE — Telephone Encounter (Signed)
Completed medical records for [facility]  Mailed to [address], [place] [address]

## 2021-01-11 DIAGNOSIS — N185 Chronic kidney disease, stage 5: Secondary | ICD-10-CM | POA: Diagnosis not present

## 2021-01-16 ENCOUNTER — Other Ambulatory Visit: Payer: Self-pay | Admitting: Hospice and Palliative Medicine

## 2021-01-20 IMAGING — MG DIGITAL SCREENING BILAT W/ TOMO W/ CAD
8 series · 8 of 24 positions shown · non-contrast
Comparison: Previous exam(s).

CLINICAL DATA: Screening.

EXAM:
DIGITAL SCREENING BILATERAL MAMMOGRAM WITH TOMO AND CAD

[R CC synth-2D]
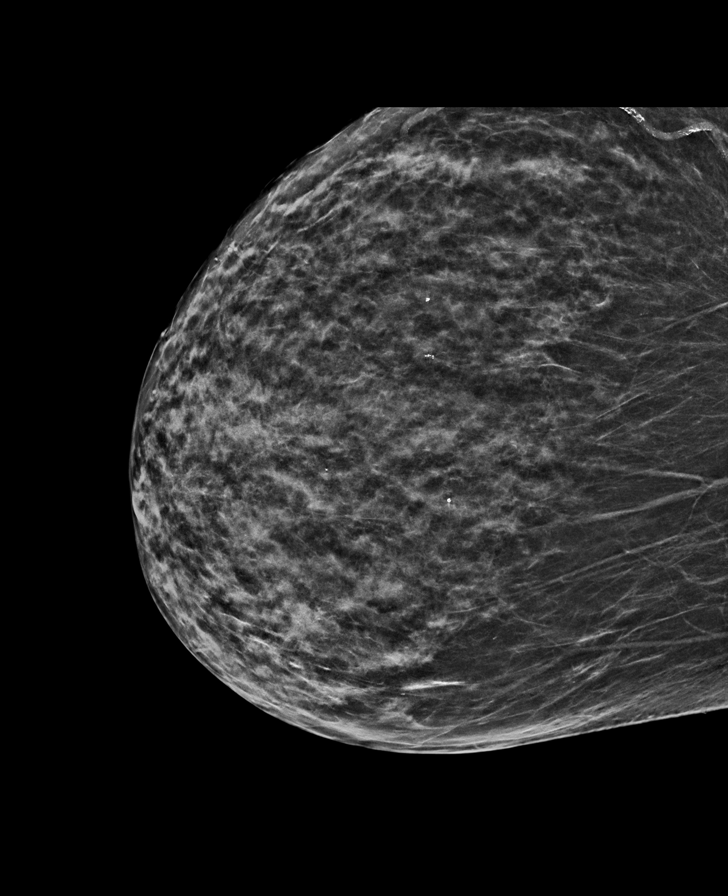

[L CC synth-2D]
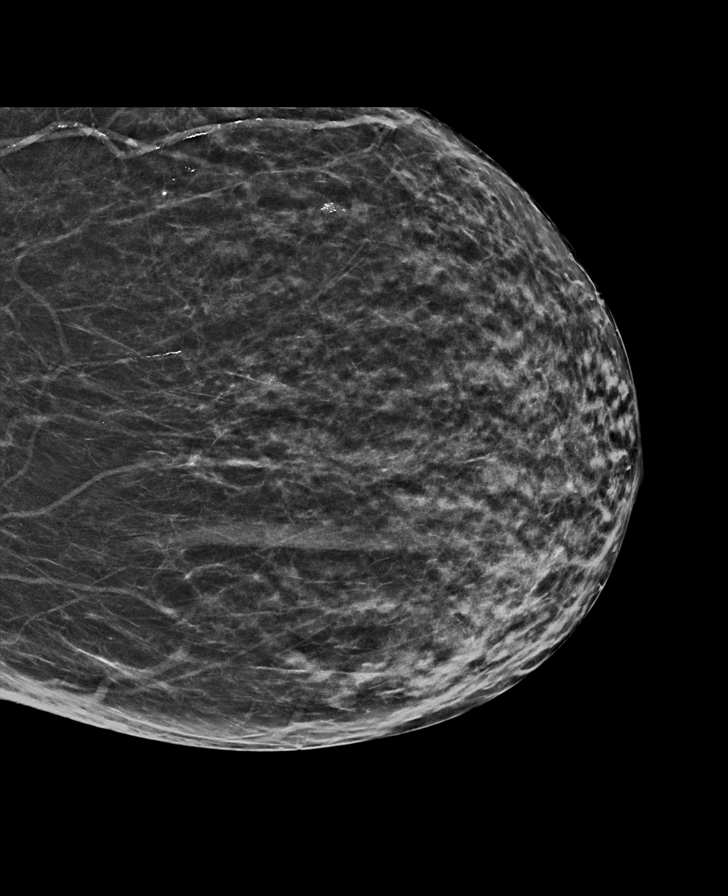

[R MLO synth-2D]
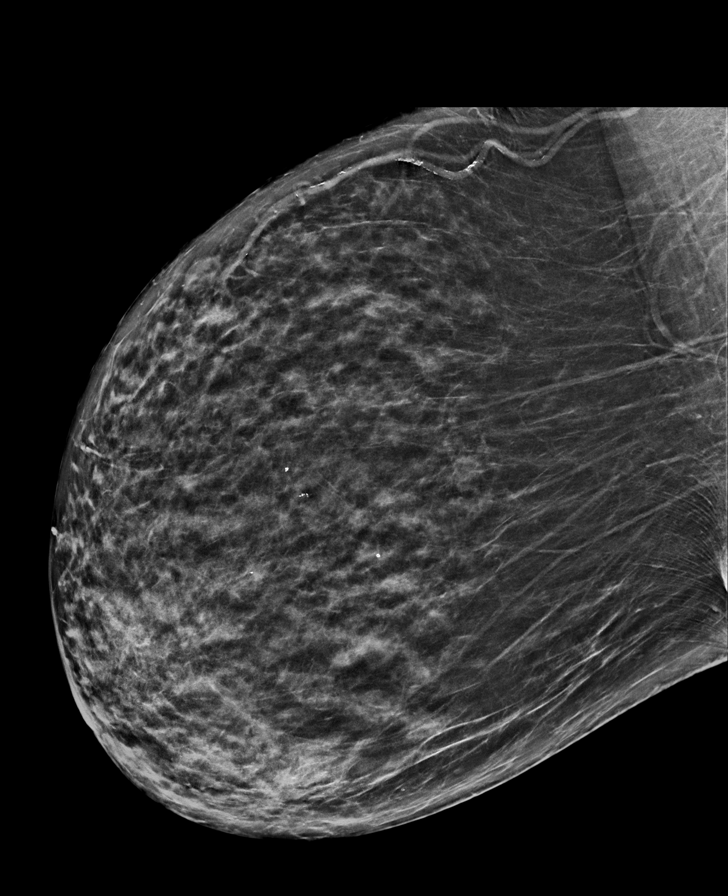

[L MLO synth-2D]
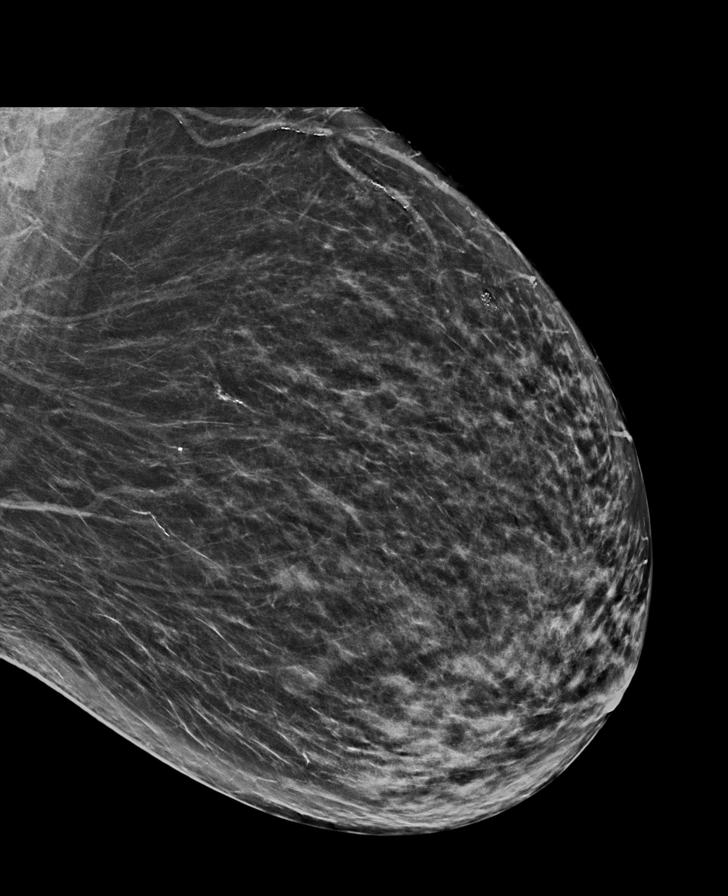

[L CC tomo · tomo slice 32/63.0]
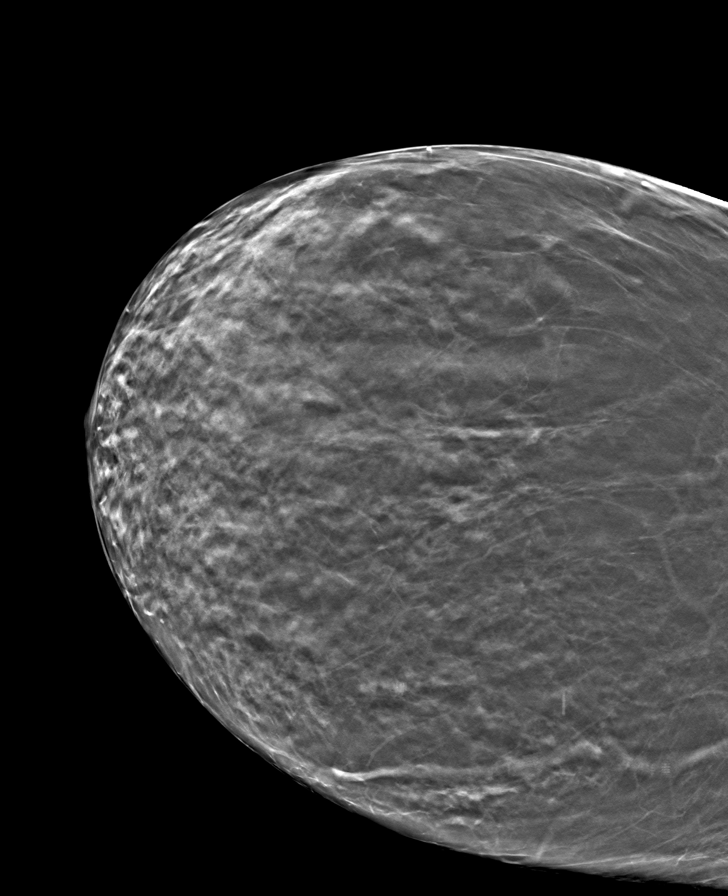

[L MLO tomo · tomo slice 37/73.0]
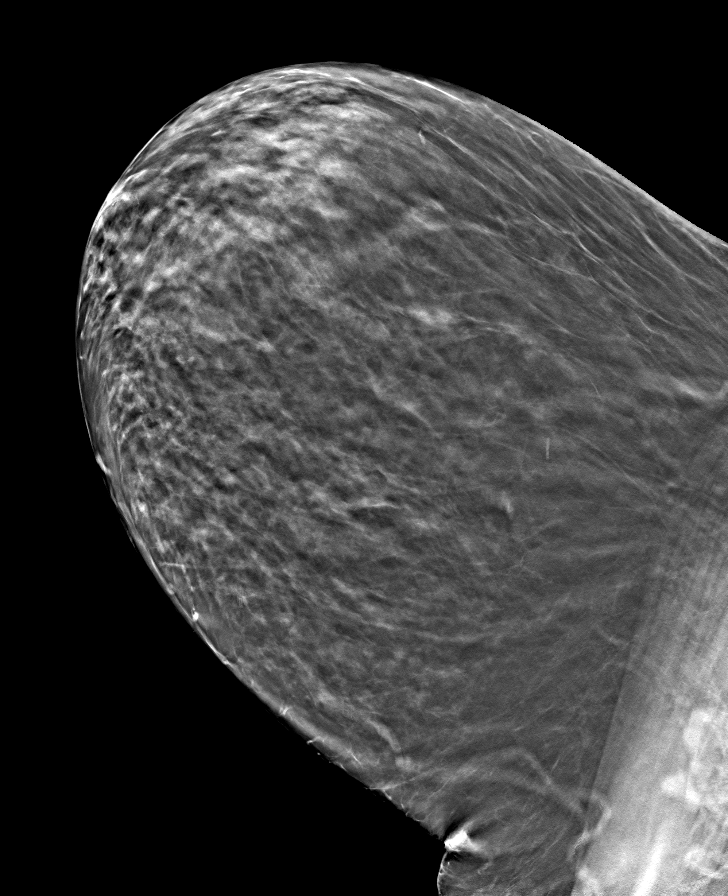

[R CC tomo · tomo slice 33/66.0]
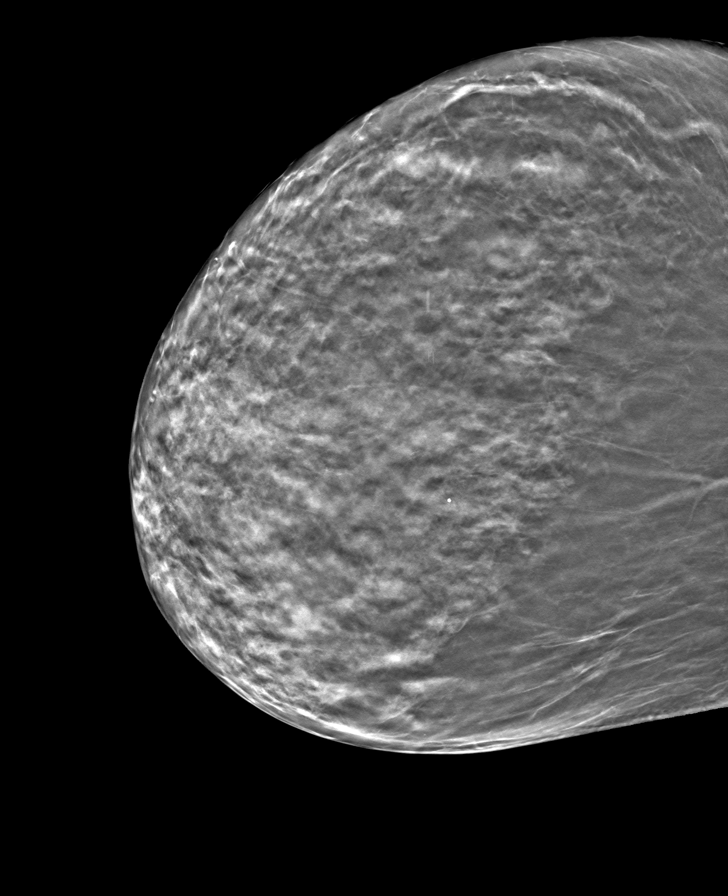

[R MLO tomo · tomo slice 35/68.0]
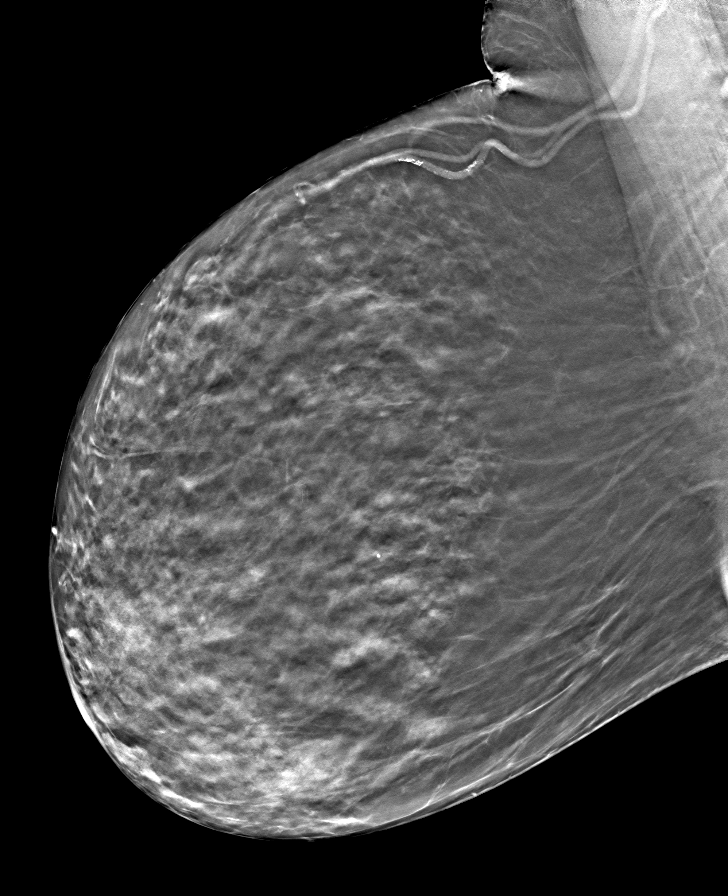

[8 of 24 positions shown; findings below may reference images not displayed]

ACR Breast Density Category c: The breast tissue is heterogeneously
dense, which may obscure small masses.
FINDINGS: In the left breast, calcifications warrant further evaluation. In
the right breast, no findings suspicious for malignancy. Images were
processed with CAD.
IMPRESSION: Further evaluation is suggested for calcifications in the left
breast.

RECOMMENDATION:
Diagnostic mammogram of the left breast. (Code:JX-[id number])

The patient will be contacted regarding the findings, and additional
imaging will be scheduled.

BI-RADS CATEGORY  0: Incomplete. Need additional imaging evaluation
and/or prior mammograms for comparison.

## 2021-02-09 DIAGNOSIS — I716 Thoracoabdominal aortic aneurysm, without rupture: Secondary | ICD-10-CM | POA: Diagnosis not present

## 2021-02-09 DIAGNOSIS — N189 Chronic kidney disease, unspecified: Secondary | ICD-10-CM | POA: Diagnosis not present

## 2021-02-22 DIAGNOSIS — I716 Thoracoabdominal aortic aneurysm, without rupture: Secondary | ICD-10-CM | POA: Diagnosis not present

## 2021-02-22 DIAGNOSIS — D631 Anemia in chronic kidney disease: Secondary | ICD-10-CM | POA: Diagnosis not present

## 2021-02-22 DIAGNOSIS — E210 Primary hyperparathyroidism: Secondary | ICD-10-CM | POA: Diagnosis not present

## 2021-02-22 DIAGNOSIS — N185 Chronic kidney disease, stage 5: Secondary | ICD-10-CM | POA: Diagnosis not present

## 2021-02-22 DIAGNOSIS — I10 Essential (primary) hypertension: Secondary | ICD-10-CM | POA: Diagnosis not present

## 2021-02-22 DIAGNOSIS — R809 Proteinuria, unspecified: Secondary | ICD-10-CM | POA: Diagnosis not present

## 2021-02-22 DIAGNOSIS — R600 Localized edema: Secondary | ICD-10-CM | POA: Diagnosis not present

## 2021-02-26 IMAGING — MG MM BREAST BX W LOC DEV 1ST LESION IMAGE BX SPEC STEREO GUIDE*L*
4 series · 4 of 4 positions shown · non-contrast
Comparison: Previous exams.
COMPARISON: Previous exams.

Addendum:
CLINICAL DATA: Left breast upper outer quadrant calcifications.

EXAM:
LEFT BREAST STEREOTACTIC CORE NEEDLE BIOPSY

[L (1 of 4)]
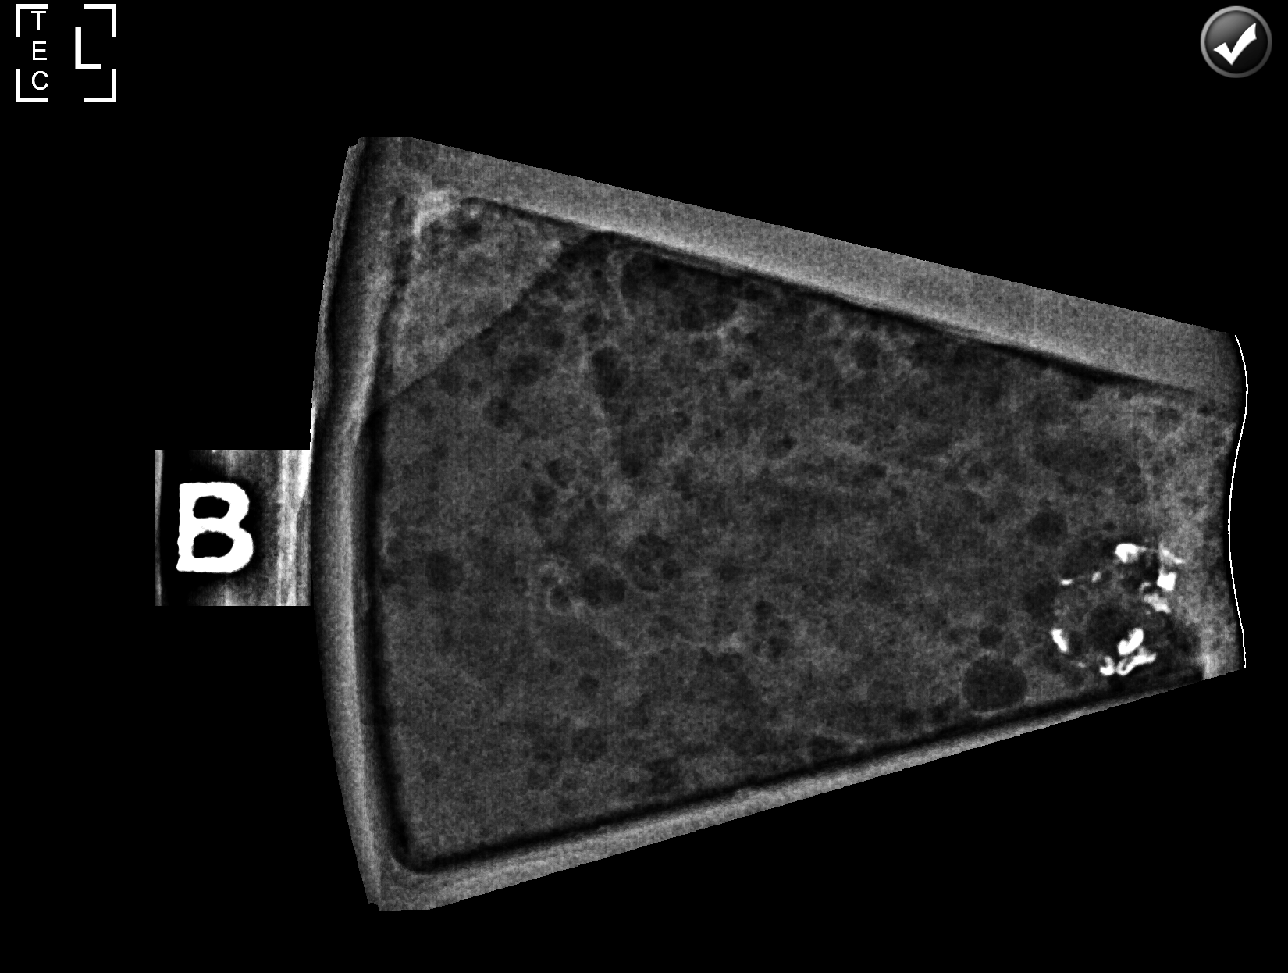

[L (2 of 4)]
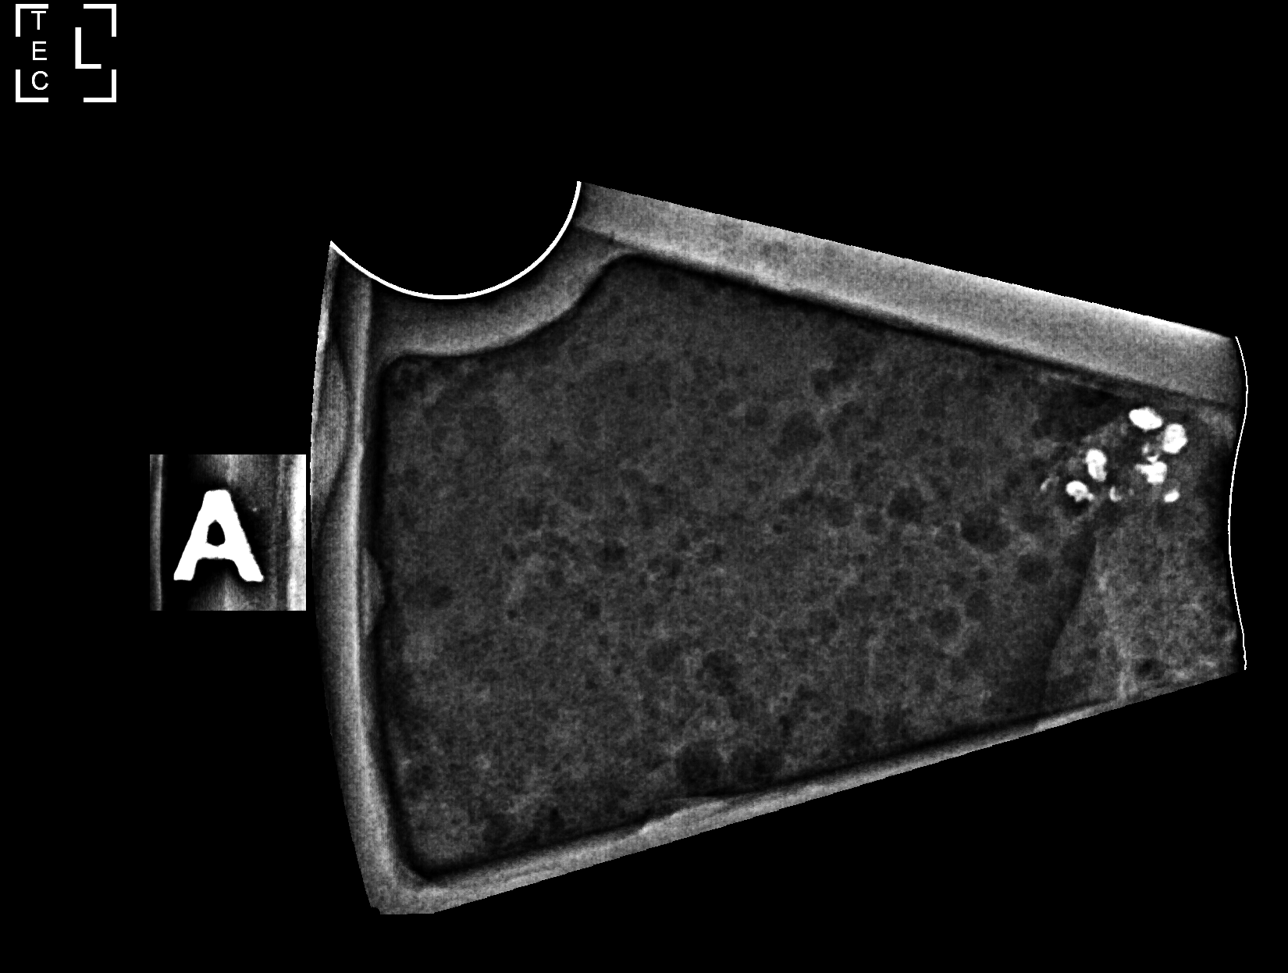

[L (3 of 4)]
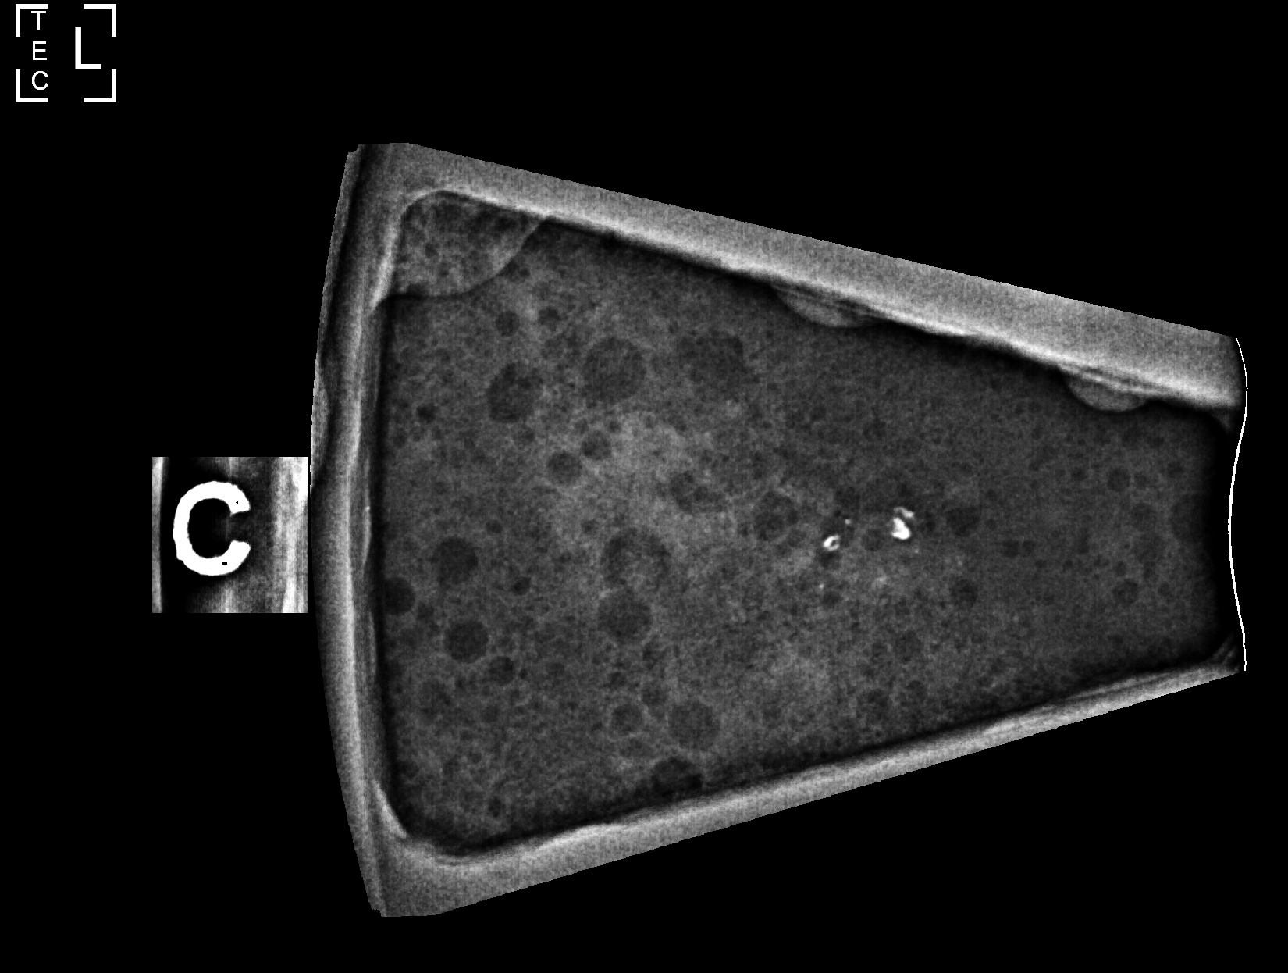

[L (4 of 4)]
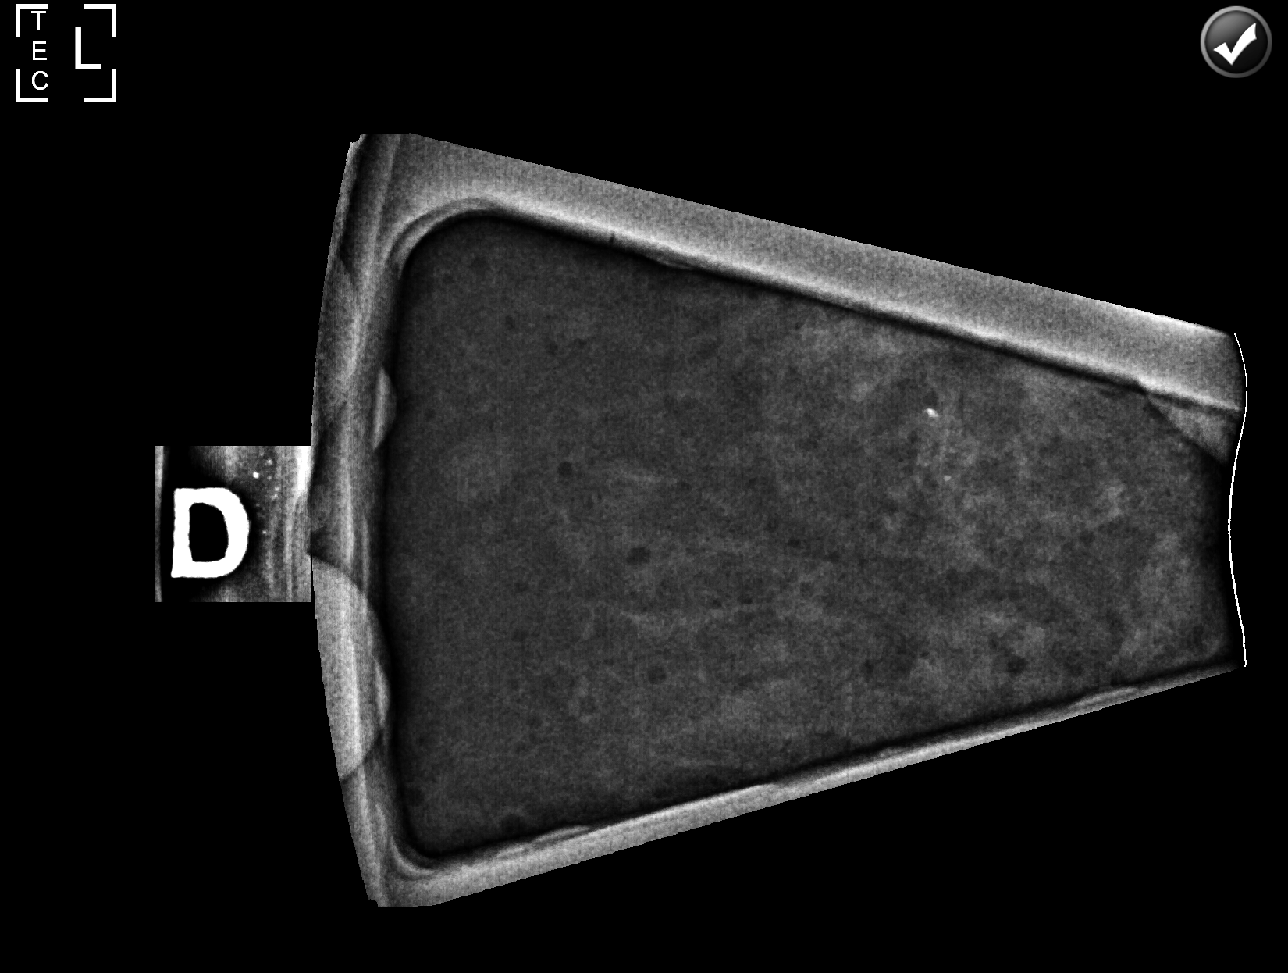

[4 of 4 positions shown; findings below may reference images not displayed]



Using sterile technique and 1% Lidocaine as local anesthetic, under
stereotactic guidance, a 9 gauge vacuum assisted device was used to
perform core needle biopsy of calcifications in the upper-outer
quadrant of the left breast using a superior approach. Specimen
radiograph was performed showing presence of calcifications.
Specimens with calcifications are identified for pathology.

Lesion quadrant: Upper outer quadrant

At the conclusion of the procedure, X shaped tissue marker clip was
deployed into the biopsy cavity. Follow-up 2-view mammogram was
performed and dictated separately.
IMPRESSION: Stereotactic-guided biopsy of left breast. No apparent
complications.

ADDENDUM:
PATHOLOGY revealed: A. BREAST, LEFT, UPPER OUTER QUADRANT;
STEREOTACTIC-GUIDED CORE BIOPSY: - FIBROADENOMATOID CHANGES WITH
STROMAL HYALINIZATION AND CALCIFICATIONS. - NEGATIVE FOR ATYPIA AND
MALIGNANCY.

Pathology results are CONCORDANT with imaging findings, per Dr.
Lorenz Jumper.

Pathology results and recommendations below were discussed with
patient by telephone on 11/28/2020. Patient reported biopsy site
within normal limits with slight tenderness at the site. Post biopsy
care instructions were reviewed, questions were answered and my
direct phone number was provided to patient. Patient was instructed
to call [HOSPITAL] if any concerns or questions arise
related to the biopsy.

Recommendation: Resume annual bilateral screening mammogram due
October 2021.

Pathology results reported by Debi Zuniga RN on 11/28/2020.



Using sterile technique and 1% Lidocaine as local anesthetic, under
stereotactic guidance, a 9 gauge vacuum assisted device was used to
perform core needle biopsy of calcifications in the upper-outer
quadrant of the left breast using a superior approach. Specimen
radiograph was performed showing presence of calcifications.
Specimens with calcifications are identified for pathology.

Lesion quadrant: Upper outer quadrant

At the conclusion of the procedure, X shaped tissue marker clip was
deployed into the biopsy cavity. Follow-up 2-view mammogram was
performed and dictated separately.
IMPRESSION: Stereotactic-guided biopsy of left breast. No apparent
complications.

## 2021-03-03 ENCOUNTER — Other Ambulatory Visit: Payer: Self-pay | Admitting: Internal Medicine

## 2021-03-03 DIAGNOSIS — I10 Essential (primary) hypertension: Secondary | ICD-10-CM

## 2021-03-09 DIAGNOSIS — I10 Essential (primary) hypertension: Secondary | ICD-10-CM | POA: Diagnosis not present

## 2021-03-09 DIAGNOSIS — Z6830 Body mass index (BMI) 30.0-30.9, adult: Secondary | ICD-10-CM | POA: Diagnosis not present

## 2021-03-09 DIAGNOSIS — I716 Thoracoabdominal aortic aneurysm, without rupture: Secondary | ICD-10-CM | POA: Diagnosis not present

## 2021-03-09 DIAGNOSIS — N186 End stage renal disease: Secondary | ICD-10-CM | POA: Diagnosis not present

## 2021-03-09 DIAGNOSIS — M1990 Unspecified osteoarthritis, unspecified site: Secondary | ICD-10-CM | POA: Diagnosis not present

## 2021-03-18 DIAGNOSIS — Z20822 Contact with and (suspected) exposure to covid-19: Secondary | ICD-10-CM | POA: Diagnosis not present

## 2021-03-21 DIAGNOSIS — M169 Osteoarthritis of hip, unspecified: Secondary | ICD-10-CM | POA: Diagnosis not present

## 2021-03-21 DIAGNOSIS — Z87891 Personal history of nicotine dependence: Secondary | ICD-10-CM | POA: Diagnosis not present

## 2021-03-21 DIAGNOSIS — N186 End stage renal disease: Secondary | ICD-10-CM | POA: Diagnosis not present

## 2021-03-21 DIAGNOSIS — I1311 Hypertensive heart and chronic kidney disease without heart failure, with stage 5 chronic kidney disease, or end stage renal disease: Secondary | ICD-10-CM | POA: Diagnosis not present

## 2021-03-21 DIAGNOSIS — N185 Chronic kidney disease, stage 5: Secondary | ICD-10-CM | POA: Diagnosis not present

## 2021-03-21 DIAGNOSIS — D631 Anemia in chronic kidney disease: Secondary | ICD-10-CM | POA: Diagnosis not present

## 2021-03-21 DIAGNOSIS — N189 Chronic kidney disease, unspecified: Secondary | ICD-10-CM | POA: Diagnosis not present

## 2021-03-21 DIAGNOSIS — R600 Localized edema: Secondary | ICD-10-CM | POA: Diagnosis not present

## 2021-03-21 DIAGNOSIS — M479 Spondylosis, unspecified: Secondary | ICD-10-CM | POA: Diagnosis not present

## 2021-03-21 DIAGNOSIS — Z4901 Encounter for fitting and adjustment of extracorporeal dialysis catheter: Secondary | ICD-10-CM | POA: Diagnosis not present

## 2021-03-21 DIAGNOSIS — I716 Thoracoabdominal aortic aneurysm, without rupture: Secondary | ICD-10-CM | POA: Diagnosis not present

## 2021-03-21 DIAGNOSIS — Z79899 Other long term (current) drug therapy: Secondary | ICD-10-CM | POA: Diagnosis not present

## 2021-03-21 DIAGNOSIS — Z4902 Encounter for fitting and adjustment of peritoneal dialysis catheter: Secondary | ICD-10-CM | POA: Diagnosis not present

## 2021-04-13 DIAGNOSIS — D631 Anemia in chronic kidney disease: Secondary | ICD-10-CM | POA: Diagnosis not present

## 2021-04-13 DIAGNOSIS — N185 Chronic kidney disease, stage 5: Secondary | ICD-10-CM | POA: Diagnosis not present

## 2021-04-13 DIAGNOSIS — R600 Localized edema: Secondary | ICD-10-CM | POA: Diagnosis not present

## 2021-04-13 DIAGNOSIS — I10 Essential (primary) hypertension: Secondary | ICD-10-CM | POA: Diagnosis not present

## 2021-04-13 DIAGNOSIS — E210 Primary hyperparathyroidism: Secondary | ICD-10-CM | POA: Diagnosis not present

## 2021-04-13 DIAGNOSIS — R809 Proteinuria, unspecified: Secondary | ICD-10-CM | POA: Diagnosis not present

## 2021-04-13 DIAGNOSIS — I716 Thoracoabdominal aortic aneurysm, without rupture: Secondary | ICD-10-CM | POA: Diagnosis not present

## 2021-04-13 DIAGNOSIS — R3129 Other microscopic hematuria: Secondary | ICD-10-CM | POA: Diagnosis not present

## 2021-04-13 DIAGNOSIS — N184 Chronic kidney disease, stage 4 (severe): Secondary | ICD-10-CM | POA: Diagnosis not present

## 2021-04-26 ENCOUNTER — Telehealth: Payer: Self-pay

## 2021-04-26 NOTE — Telephone Encounter (Signed)
Left vm to screen for 04/27/21 appointment-Toni

## 2021-04-27 ENCOUNTER — Ambulatory Visit: Payer: PPO | Admitting: Physician Assistant

## 2021-05-02 DIAGNOSIS — N186 End stage renal disease: Secondary | ICD-10-CM | POA: Diagnosis not present

## 2021-05-02 DIAGNOSIS — Z992 Dependence on renal dialysis: Secondary | ICD-10-CM | POA: Diagnosis not present

## 2021-05-08 DIAGNOSIS — Z992 Dependence on renal dialysis: Secondary | ICD-10-CM | POA: Diagnosis not present

## 2021-05-08 DIAGNOSIS — N186 End stage renal disease: Secondary | ICD-10-CM | POA: Diagnosis not present

## 2021-05-12 ENCOUNTER — Other Ambulatory Visit: Payer: Self-pay | Admitting: Nephrology

## 2021-05-12 ENCOUNTER — Ambulatory Visit
Admission: RE | Admit: 2021-05-12 | Discharge: 2021-05-12 | Disposition: A | Discharge status: Home / Self Care | Payer: PPO | Attending: Nephrology | Admitting: Nephrology

## 2021-05-12 ENCOUNTER — Encounter: Payer: Self-pay | Admitting: Nephrology

## 2021-05-12 ENCOUNTER — Ambulatory Visit
Admission: RE | Admit: 2021-05-12 | Discharge: 2021-05-12 | Disposition: A | Discharge status: Home / Self Care | Payer: PPO | Source: Ambulatory Visit | Attending: Nephrology | Admitting: Nephrology

## 2021-05-12 ENCOUNTER — Other Ambulatory Visit: Payer: Self-pay

## 2021-05-12 DIAGNOSIS — N186 End stage renal disease: Secondary | ICD-10-CM

## 2021-05-12 DIAGNOSIS — R109 Unspecified abdominal pain: Secondary | ICD-10-CM | POA: Diagnosis not present

## 2021-05-13 ENCOUNTER — Other Ambulatory Visit: Payer: Self-pay | Admitting: Nurse Practitioner

## 2021-05-13 DIAGNOSIS — I10 Essential (primary) hypertension: Secondary | ICD-10-CM

## 2021-05-15 DIAGNOSIS — Z992 Dependence on renal dialysis: Secondary | ICD-10-CM | POA: Diagnosis not present

## 2021-05-15 DIAGNOSIS — N186 End stage renal disease: Secondary | ICD-10-CM | POA: Diagnosis not present

## 2021-05-24 DIAGNOSIS — N186 End stage renal disease: Secondary | ICD-10-CM | POA: Diagnosis not present

## 2021-05-24 DIAGNOSIS — Z992 Dependence on renal dialysis: Secondary | ICD-10-CM | POA: Diagnosis not present

## 2021-05-29 ENCOUNTER — Other Ambulatory Visit: Payer: Self-pay | Admitting: Internal Medicine

## 2021-05-29 DIAGNOSIS — I10 Essential (primary) hypertension: Secondary | ICD-10-CM

## 2021-05-30 ENCOUNTER — Telehealth: Payer: Self-pay

## 2021-05-30 NOTE — Telephone Encounter (Signed)
Pt advised that need follow up appt and I made for on 06/13/2021 with Elizabeth Roy

## 2021-05-31 ENCOUNTER — Ambulatory Visit
Admission: RE | Admit: 2021-05-31 | Discharge: 2021-05-31 | Disposition: A | Discharge status: Home / Self Care | Payer: PPO | Source: Ambulatory Visit | Attending: Nephrology | Admitting: Nephrology

## 2021-05-31 ENCOUNTER — Other Ambulatory Visit: Payer: Self-pay

## 2021-05-31 ENCOUNTER — Other Ambulatory Visit: Payer: Self-pay | Admitting: Nephrology

## 2021-05-31 DIAGNOSIS — N186 End stage renal disease: Secondary | ICD-10-CM | POA: Diagnosis not present

## 2021-05-31 DIAGNOSIS — N281 Cyst of kidney, acquired: Secondary | ICD-10-CM | POA: Diagnosis not present

## 2021-05-31 DIAGNOSIS — R3911 Hesitancy of micturition: Secondary | ICD-10-CM

## 2021-05-31 DIAGNOSIS — R188 Other ascites: Secondary | ICD-10-CM | POA: Diagnosis not present

## 2021-06-01 DIAGNOSIS — D509 Iron deficiency anemia, unspecified: Secondary | ICD-10-CM | POA: Diagnosis not present

## 2021-06-01 DIAGNOSIS — Z992 Dependence on renal dialysis: Secondary | ICD-10-CM | POA: Diagnosis not present

## 2021-06-01 DIAGNOSIS — N186 End stage renal disease: Secondary | ICD-10-CM | POA: Diagnosis not present

## 2021-06-13 ENCOUNTER — Ambulatory Visit (INDEPENDENT_AMBULATORY_CARE_PROVIDER_SITE_OTHER): Payer: PPO | Admitting: Nurse Practitioner

## 2021-06-13 ENCOUNTER — Encounter: Payer: Self-pay | Admitting: Nurse Practitioner

## 2021-06-13 ENCOUNTER — Other Ambulatory Visit: Payer: Self-pay

## 2021-06-13 ENCOUNTER — Ambulatory Visit: Payer: PPO | Admitting: Nurse Practitioner

## 2021-06-13 VITALS — BP 130/80 | HR 60 | Temp 97.8°F | Resp 16 | Ht 59.0 in | Wt 143.0 lb

## 2021-06-13 DIAGNOSIS — Z992 Dependence on renal dialysis: Secondary | ICD-10-CM | POA: Diagnosis not present

## 2021-06-13 DIAGNOSIS — I10 Essential (primary) hypertension: Secondary | ICD-10-CM | POA: Diagnosis not present

## 2021-06-13 DIAGNOSIS — N185 Chronic kidney disease, stage 5: Secondary | ICD-10-CM | POA: Diagnosis not present

## 2021-06-13 MED ORDER — AMLODIPINE BESYLATE 10 MG PO TABS: 10.0000 mg | ORAL_TABLET | Freq: Every day | ORAL | 1 refills | Status: DC

## 2021-06-13 MED ORDER — BISOPROLOL FUMARATE 10 MG PO TABS: 10.0000 mg | ORAL_TABLET | Freq: Every day | ORAL | 1 refills | Status: DC

## 2021-06-13 NOTE — Progress Notes (Signed)
[facility] [place], [place] [address]  Internal MEDICINE  Office Visit Note  Patient Name: Elizabeth Roy  [id number]  [id number]  Date of Service: 06/13/2021  Chief Complaint  Patient presents with   Follow-up   Hypertension    HPI [name] presents for a follow up visit for hypertension and medication refill. Her blood pressure is currently stable on amlodipine and bisoprolol. She was recently started on peritoneal dialysis and has been home for about 1 week since starting peritoneal dialysis. She is going to see her nephrologist soon to follow up.     Current Medication: Outpatient Encounter Medications as of 06/13/2021  Medication Sig   ergocalciferol (VITAMIN D2) 1.25 MG (50000 UT) capsule Take by mouth.   Olopatadine HCl 0.2 % SOLN Place 1 drop into both eyes daily.   [DISCONTINUED] amLODipine (NORVASC) 10 MG tablet TAKE 1 TABLET BY MOUTH EVERY DAY   [DISCONTINUED] bisoprolol (ZEBETA) 10 MG tablet Take 1 tablet (10 mg total) by mouth daily.   amLODipine (NORVASC) 10 MG tablet Take 1 tablet (10 mg total) by mouth daily.   bisoprolol (ZEBETA) 10 MG tablet Take 1 tablet (10 mg total) by mouth daily.   [DISCONTINUED] furosemide (LASIX) 20 MG tablet Take 0.5 tablets (10 mg total) by mouth daily.   [DISCONTINUED] sodium bicarbonate 650 MG tablet Take by mouth. (Patient not taking: Reported on 06/13/2021)   No facility-administered encounter medications on file as of 06/13/2021.    Surgical History: Past Surgical History:  Procedure Laterality Date   BREAST BIOPSY Left 11/24/2020   Affirm bx-"X" clip-path pending   MOUTH SURGERY      Medical History: Past Medical History:  Diagnosis Date   Arthritis    Hypertension    Renal disorder     Family History: Family History  Problem Relation Age of Onset   Hypertension Mother    Intellectual disability Sister    Stroke Sister     Social History   Socioeconomic History   Marital status: Widowed     Spouse name: Not on file   Number of children: Not on file   Years of education: Not on file   Highest education level: Not on file  Occupational History   Not on file  Tobacco Use   Smoking status: Former    Types: Cigarettes   Smokeless tobacco: Never  Substance and Sexual Activity   Alcohol use: Never   Drug use: No   Sexual activity: Not on file  Other Topics Concern   Not on file  Social History Narrative   Not on file   Social Determinants of Health   Financial Resource Strain: Not on file  Food Insecurity: Not on file  Transportation Needs: Not on file  Physical Activity: Not on file  Stress: Not on file  Social Connections: Not on file  Intimate Partner Violence: Not on file      Review of Systems  Constitutional:  Negative for chills, fatigue and unexpected weight change.  HENT:  Negative for congestion, rhinorrhea, sneezing and sore throat.   Eyes:  Negative for redness.  Respiratory:  Negative for cough, chest tightness and shortness of breath.   Cardiovascular:  Negative for chest pain and palpitations.  Gastrointestinal:  Negative for abdominal pain, constipation, diarrhea, nausea and vomiting.  Genitourinary:  Negative for dysuria and frequency.  Musculoskeletal:  Negative for arthralgias, back pain, joint swelling and neck pain.  Skin:  Negative for rash.  Neurological: Negative.  Negative for tremors and numbness.  Hematological:  Negative for adenopathy. Does not bruise/bleed easily.  Psychiatric/Behavioral:  Negative for behavioral problems (Depression), sleep disturbance and suicidal ideas. The patient is not nervous/anxious.    Vital Signs: BP 130/80   Pulse 60   Temp 97.8 F (36.6 C)   Resp 16   Ht '4\' 11"'$  (1.499 m)   Wt 143 lb (64.9 kg)   SpO2 97%   BMI 28.88 kg/m    Physical Exam Vitals reviewed.  Constitutional:      General: She is not in acute distress.    Appearance: Normal appearance. She is normal weight. She is not  ill-appearing.  HENT:     Head: Normocephalic and atraumatic.  Eyes:     Extraocular Movements: Extraocular movements intact.     Pupils: Pupils are equal, round, and reactive to light.  Cardiovascular:     Rate and Rhythm: Normal rate and regular rhythm.  Pulmonary:     Effort: Pulmonary effort is normal. No respiratory distress.  Neurological:     Mental Status: She is alert and oriented to person, place, and time.     Cranial Nerves: No cranial nerve deficit.     Coordination: Coordination normal.     Gait: Gait normal.  Psychiatric:        Mood and Affect: Mood normal.        Behavior: Behavior normal.     Assessment/Plan: 1. Essential hypertension Stable, continue as prescribed, refills ordered - amLODipine (NORVASC) 10 MG tablet; Take 1 tablet (10 mg total) by mouth daily.  Dispense: 90 tablet; Refill: 1 - bisoprolol (ZEBETA) 10 MG tablet; Take 1 tablet (10 mg total) by mouth daily.  Dispense: 90 tablet; Refill: 1  2. CKD (chronic kidney disease), stage V ([place]) Followed by nephrology, started CAPD  3. Continuous ambulatory peritoneal dialysis status [facility]) Has been home x1 week since starting peritoneal dialysis.   General Counseling: Elizabeth Roy verbalizes understanding of the findings of todays visit and agrees with plan of treatment. I have discussed any further diagnostic evaluation that may be needed or ordered today. We also reviewed her medications today. she has been encouraged to call the office with any questions or concerns that should arise related to todays visit.    No orders of the defined types were placed in this encounter.   Meds ordered this encounter  Medications   amLODipine (NORVASC) 10 MG tablet    Sig: Take 1 tablet (10 mg total) by mouth daily.    Dispense:  90 tablet    Refill:  1   bisoprolol (ZEBETA) 10 MG tablet    Sig: Take 1 tablet (10 mg total) by mouth daily.    Dispense:  90 tablet    Refill:  1    Return in about 3 months (around  09/13/2021) for F/U, med refill, BP check, Aerik Polan PCP.   Total time spent:20 Minutes Time spent includes review of chart, medications, test results, and follow up plan with the patient.   [place] Controlled Substance Database was reviewed by me.  This patient was seen by Jonetta Osgood, FNP-C in collaboration with Dr. Clayborn Bigness as a part of collaborative care agreement.   Deny Chevez R. Valetta Fuller, MSN, FNP-C Internal medicine

## 2021-06-15 DIAGNOSIS — D631 Anemia in chronic kidney disease: Secondary | ICD-10-CM | POA: Diagnosis not present

## 2021-06-15 DIAGNOSIS — D509 Iron deficiency anemia, unspecified: Secondary | ICD-10-CM | POA: Diagnosis not present

## 2021-06-15 DIAGNOSIS — Z992 Dependence on renal dialysis: Secondary | ICD-10-CM | POA: Diagnosis not present

## 2021-06-15 DIAGNOSIS — N186 End stage renal disease: Secondary | ICD-10-CM | POA: Diagnosis not present

## 2021-07-14 DIAGNOSIS — N186 End stage renal disease: Secondary | ICD-10-CM | POA: Diagnosis not present

## 2021-07-14 DIAGNOSIS — Z992 Dependence on renal dialysis: Secondary | ICD-10-CM | POA: Diagnosis not present

## 2021-07-15 DIAGNOSIS — N186 End stage renal disease: Secondary | ICD-10-CM | POA: Diagnosis not present

## 2021-07-15 DIAGNOSIS — Z23 Encounter for immunization: Secondary | ICD-10-CM | POA: Diagnosis not present

## 2021-07-15 DIAGNOSIS — D631 Anemia in chronic kidney disease: Secondary | ICD-10-CM | POA: Diagnosis not present

## 2021-07-15 DIAGNOSIS — D509 Iron deficiency anemia, unspecified: Secondary | ICD-10-CM | POA: Diagnosis not present

## 2021-07-15 DIAGNOSIS — Z992 Dependence on renal dialysis: Secondary | ICD-10-CM | POA: Diagnosis not present

## 2021-07-24 ENCOUNTER — Encounter: Payer: Self-pay | Admitting: Physician Assistant

## 2021-07-24 ENCOUNTER — Ambulatory Visit (INDEPENDENT_AMBULATORY_CARE_PROVIDER_SITE_OTHER): Payer: PPO | Admitting: Physician Assistant

## 2021-07-24 ENCOUNTER — Other Ambulatory Visit: Payer: Self-pay

## 2021-07-24 DIAGNOSIS — I10 Essential (primary) hypertension: Secondary | ICD-10-CM

## 2021-07-24 DIAGNOSIS — Z1212 Encounter for screening for malignant neoplasm of rectum: Secondary | ICD-10-CM

## 2021-07-24 DIAGNOSIS — Z992 Dependence on renal dialysis: Secondary | ICD-10-CM | POA: Diagnosis not present

## 2021-07-24 DIAGNOSIS — K625 Hemorrhage of anus and rectum: Secondary | ICD-10-CM

## 2021-07-24 DIAGNOSIS — N186 End stage renal disease: Secondary | ICD-10-CM

## 2021-07-24 DIAGNOSIS — Z1211 Encounter for screening for malignant neoplasm of colon: Secondary | ICD-10-CM | POA: Diagnosis not present

## 2021-07-24 NOTE — Progress Notes (Signed)
[facility] [place], [place] [address]  Internal MEDICINE  Office Visit Note  Patient Name: Elizabeth Roy  [id number]  [id number]  Date of Service: 07/24/2021  Chief Complaint  Patient presents with   Acute Visit    Blood in stool possible hemorrhoids       HPI Pt is here for a sick visit. -Seeing streaks of blood on tissue when she wipes -She is a PD patient and took laxative but stopped bc too frequent of BM. Started noticing blood right after stopping but unsure if it started prior. Not painful with wiping since stopping laxative. No pain with BM and no pain with sitting either. Does not look at stool closely. From wiping states she doesn't think the stools are dark. Doesn't feel constipated usually. Usually has BM every night. Does state blood on tissue is bright red and some days have more blood than others. Has been going on for weeks. Does have some mild abdominal tenderness. -Furthermore does state she was started on sensipar not too [place] ago and may discuss this with nephrology further as this could contribute to symptoms. -Denies any dizziness or lightheadedness -listed that cologuard done in 2021 however never done and no colonoscopy. Discussed GI visit to follow up on continued bleed with concern for possible hemorrhoid though pt is not describing any pain. Does need colon cancer screening via colonoscopy vs cologuard and pt plans to discuss this with nephrology and GI since she is a dialysis patient, but is having symptoms worse investigating -has blood work with neph on wed so will await cbc per nephrology results though pt is chronically anemia due to ESRD  Current Medication:  Outpatient Encounter Medications as of 07/24/2021  Medication Sig   amLODipine (NORVASC) 10 MG tablet Take 1 tablet (10 mg total) by mouth daily.   bisoprolol (ZEBETA) 10 MG tablet Take 1 tablet (10 mg total) by mouth daily.   cinacalcet (SENSIPAR) 30 MG tablet Take 30  mg by mouth daily.   ergocalciferol (VITAMIN D2) 1.25 MG (50000 UT) capsule Take by mouth.   Olopatadine HCl 0.2 % SOLN Place 1 drop into both eyes daily.   No facility-administered encounter medications on file as of 07/24/2021.      Medical History: Past Medical History:  Diagnosis Date   Arthritis    Hypertension    Renal disorder      Vital Signs: BP 114/79   Pulse 61   Temp 97.7 F (36.5 C)   Resp 16   Ht '4\' 11"'$  (1.499 m)   Wt 142 lb (64.4 kg)   SpO2 98%   BMI 28.68 kg/m    Review of Systems  Constitutional:  Negative for fatigue and fever.  HENT:  Negative for congestion, mouth sores and postnasal drip.   Respiratory:  Negative for cough.   Cardiovascular:  Negative for chest pain.  Gastrointestinal:  Positive for abdominal pain, anal bleeding and blood in stool. Negative for constipation, diarrhea, nausea and rectal pain.  Genitourinary:  Negative for flank pain.  Neurological:  Negative for dizziness and light-headedness.  Psychiatric/Behavioral: Negative.     Physical Exam Constitutional:      General: She is not in acute distress.    Appearance: She is well-developed. She is not diaphoretic.  HENT:     Head: Normocephalic and atraumatic.     Mouth/Throat:     Pharynx: No oropharyngeal exudate.  Eyes:     Pupils: Pupils are equal, round, and  reactive to light.  Neck:     Thyroid: No thyromegaly.     Vascular: No JVD.     Trachea: No tracheal deviation.  Cardiovascular:     Rate and Rhythm: Normal rate and regular rhythm.     Heart sounds: Normal heart sounds. No murmur heard.   No friction rub. No gallop.  Pulmonary:     Effort: Pulmonary effort is normal. No respiratory distress.     Breath sounds: No wheezing or rales.  Chest:     Chest wall: No tenderness.  Abdominal:     General: Bowel sounds are normal. There is no distension.     Palpations: Abdomen is soft.     Tenderness: There is abdominal tenderness. There is no guarding or  rebound.  Musculoskeletal:        General: Normal range of motion.     Cervical back: Normal range of motion and neck supple.  Lymphadenopathy:     Cervical: No cervical adenopathy.  Skin:    General: Skin is warm and dry.  Neurological:     Mental Status: She is alert and oriented to person, place, and time.     Cranial Nerves: No cranial nerve deficit.  Psychiatric:        Behavior: Behavior normal.        Thought Content: Thought content normal.        Judgment: Judgment normal.      Assessment/Plan: 1. BRBPR (bright red blood per rectum) Will refer to GI for further eval and management given chronicity of symptoms. Pt instructed too monitor stool to look for blood actually in stool or dark tarry appearance. Patient advised to increase fiber and monitor for any worsening symptoms and go to ED if these arise - Ambulatory referral to Gastroenterology  2. ESRD (end stage renal disease) on dialysis [facility]) Followed by nephrology who plan to draw labs this week and can monitor CBC. Pt also instructed to discuss cinacalet as this is a newer medication for her and can be known to cause GI upset or bleed.  3. Essential hypertension Well controlled, continue norvasc  4. Screening for colorectal cancer Will refer to GI to discuss cologuard vs colonoscopy given she is a dialysis pt but now has bleeding from rectum   General Counseling: Tatiyanna verbalizes understanding of the findings of todays visit and agrees with plan of treatment. I have discussed any further diagnostic evaluation that may be needed or ordered today. We also reviewed her medications today. she has been encouraged to call the office with any questions or concerns that should arise related to todays visit.    Counseling:    No orders of the defined types were placed in this encounter.   No orders of the defined types were placed in this encounter.   Time spent:35 Minutes

## 2021-08-14 DIAGNOSIS — N186 End stage renal disease: Secondary | ICD-10-CM | POA: Diagnosis not present

## 2021-08-14 DIAGNOSIS — Z992 Dependence on renal dialysis: Secondary | ICD-10-CM | POA: Diagnosis not present

## 2021-08-15 DIAGNOSIS — N186 End stage renal disease: Secondary | ICD-10-CM | POA: Diagnosis not present

## 2021-08-15 DIAGNOSIS — D509 Iron deficiency anemia, unspecified: Secondary | ICD-10-CM | POA: Diagnosis not present

## 2021-08-15 DIAGNOSIS — Z23 Encounter for immunization: Secondary | ICD-10-CM | POA: Diagnosis not present

## 2021-08-15 DIAGNOSIS — Z992 Dependence on renal dialysis: Secondary | ICD-10-CM | POA: Diagnosis not present

## 2021-08-15 DIAGNOSIS — D631 Anemia in chronic kidney disease: Secondary | ICD-10-CM | POA: Diagnosis not present

## 2021-08-21 ENCOUNTER — Other Ambulatory Visit: Payer: Self-pay

## 2021-08-21 ENCOUNTER — Ambulatory Visit (INDEPENDENT_AMBULATORY_CARE_PROVIDER_SITE_OTHER): Payer: PPO | Admitting: Physician Assistant

## 2021-08-21 ENCOUNTER — Encounter: Payer: Self-pay | Admitting: Physician Assistant

## 2021-08-21 ENCOUNTER — Ambulatory Visit: Payer: PPO | Admitting: Physician Assistant

## 2021-08-21 DIAGNOSIS — I712 Thoracic aortic aneurysm, without rupture, unspecified: Secondary | ICD-10-CM

## 2021-08-21 DIAGNOSIS — I10 Essential (primary) hypertension: Secondary | ICD-10-CM

## 2021-08-21 DIAGNOSIS — Z992 Dependence on renal dialysis: Secondary | ICD-10-CM

## 2021-08-21 DIAGNOSIS — E782 Mixed hyperlipidemia: Secondary | ICD-10-CM | POA: Diagnosis not present

## 2021-08-21 DIAGNOSIS — Z1212 Encounter for screening for malignant neoplasm of rectum: Secondary | ICD-10-CM

## 2021-08-21 DIAGNOSIS — Z1211 Encounter for screening for malignant neoplasm of colon: Secondary | ICD-10-CM

## 2021-08-21 DIAGNOSIS — N186 End stage renal disease: Secondary | ICD-10-CM

## 2021-08-21 DIAGNOSIS — Z0001 Encounter for general adult medical examination with abnormal findings: Secondary | ICD-10-CM

## 2021-08-21 DIAGNOSIS — E538 Deficiency of other specified B group vitamins: Secondary | ICD-10-CM | POA: Diagnosis not present

## 2021-08-21 DIAGNOSIS — R300 Dysuria: Secondary | ICD-10-CM

## 2021-08-21 DIAGNOSIS — R5383 Other fatigue: Secondary | ICD-10-CM

## 2021-08-21 NOTE — Progress Notes (Signed)
[facility] [place], [place] [address]  Internal MEDICINE  Office Visit Note  Patient Name: Elizabeth Roy  [id number]  [id number]  Date of Service: 08/23/2021  Chief Complaint  Patient presents with   Medicare Wellness    Discuss meds   Hypertension     HPI Pt is here for routine health maintenance examination and has no complaints today -Rectal bleeding has improved, occasional blood. She has not yet seen GI, but is scheduled. Pt also never did cologuard or colonoscopy and will discuss with GI at visit. -Dialysis daily at home. Thinks she has a follow up with nephrology in a week or two -She is followed by vascular for aneurysm, has not heard anything recently about next steps and advised to give their office a call for an update -BP at home 130s/80s -Has not taken any of her medications today which is likely why her BP is high  -Had mammogram in Jan with biopsy done post calcifications seen which was negative for malignancy. -Due for routine labs-lipid, Thyroid and b12/folate; other labs done via nephrology and pt will take order to next visit to see if these labs can be added to her lab draw, if not she will go to [facility]  Current Medication: Outpatient Encounter Medications as of 08/21/2021  Medication Sig   amLODipine (NORVASC) 10 MG tablet Take 1 tablet (10 mg total) by mouth daily.   bisoprolol (ZEBETA) 10 MG tablet Take 1 tablet (10 mg total) by mouth daily.   cinacalcet (SENSIPAR) 30 MG tablet Take 30 mg by mouth daily.   Olopatadine HCl 0.2 % SOLN Place 1 drop into both eyes daily.   [DISCONTINUED] ergocalciferol (VITAMIN D2) 1.25 MG (50000 UT) capsule Take by mouth. (Patient not taking: Reported on 08/21/2021)   No facility-administered encounter medications on file as of 08/21/2021.    Surgical History: Past Surgical History:  Procedure Laterality Date   BREAST BIOPSY Left 11/24/2020   Affirm bx-"X" clip-path pending   MOUTH SURGERY       Medical History: Past Medical History:  Diagnosis Date   Arthritis    Hypertension    Renal disorder     Family History: Family History  Problem Relation Age of Onset   Hypertension Mother    Intellectual disability Sister    Stroke Sister       Review of Systems  Constitutional:  Negative for chills, fatigue and unexpected weight change.  HENT:  Negative for congestion, postnasal drip, rhinorrhea, sneezing and sore throat.   Eyes:  Negative for redness.  Respiratory:  Negative for cough, chest tightness and shortness of breath.   Cardiovascular:  Negative for chest pain and palpitations.  Gastrointestinal:  Negative for abdominal pain, constipation, diarrhea, nausea and vomiting.  Genitourinary:  Negative for dysuria and frequency.  Musculoskeletal:  Negative for arthralgias, back pain, joint swelling and neck pain.  Skin:  Negative for rash.  Neurological: Negative.  Negative for tremors and numbness.  Hematological:  Negative for adenopathy. Does not bruise/bleed easily.  Psychiatric/Behavioral:  Negative for behavioral problems (Depression), sleep disturbance and suicidal ideas. The patient is not nervous/anxious.     Vital Signs: BP (!) 148/84   Pulse 64   Temp 98.5 F (36.9 C)   Resp 16   Ht 4\' 11"  (1.499 m)   Wt 141 lb 6.4 oz (64.1 kg)   SpO2 98%   BMI 28.56 kg/m    Physical Exam Vitals and nursing note reviewed.  Constitutional:  General: She is not in acute distress.    Appearance: She is well-developed. She is not diaphoretic.  HENT:     Head: Normocephalic and atraumatic.     Right Ear: External ear normal.     Left Ear: External ear normal.     Nose: Nose normal.     Mouth/Throat:     Pharynx: No oropharyngeal exudate.  Eyes:     General: No scleral icterus.       Right eye: No discharge.        Left eye: No discharge.     Conjunctiva/sclera: Conjunctivae normal.     Pupils: Pupils are equal, round, and reactive to light.  Neck:      Thyroid: No thyromegaly.     Vascular: No JVD.     Trachea: No tracheal deviation.  Cardiovascular:     Rate and Rhythm: Normal rate and regular rhythm.     Heart sounds: Normal heart sounds. No murmur heard.   No friction rub. No gallop.  Pulmonary:     Effort: Pulmonary effort is normal. No respiratory distress.     Breath sounds: Normal breath sounds. No stridor. No wheezing or rales.  Chest:     Chest wall: No tenderness.  Abdominal:     General: Bowel sounds are normal. There is no distension.     Palpations: Abdomen is soft. There is no mass.     Tenderness: There is no abdominal tenderness. There is no guarding or rebound.  Musculoskeletal:        General: No tenderness or deformity. Normal range of motion.     Cervical back: Normal range of motion and neck supple.  Lymphadenopathy:     Cervical: No cervical adenopathy.  Skin:    General: Skin is warm and dry.     Coloration: Skin is not pale.     Findings: No erythema or rash.  Neurological:     Mental Status: She is alert.     Cranial Nerves: No cranial nerve deficit.     Motor: No abnormal muscle tone.     Coordination: Coordination normal.     Deep Tendon Reflexes: Reflexes are normal and symmetric.  Psychiatric:        Behavior: Behavior normal.        Thought Content: Thought content normal.        Judgment: Judgment normal.     LABS: Recent Results (from the past 2160 hour(s))  UA/M w/rflx Culture, Routine     Status: Abnormal   Collection Time: 08/21/21  3:52 PM   Specimen: Urine   Urine  Result Value Ref Range   Specific Gravity, UA 1.014 1.005 - 1.030   pH, UA 6.5 5.0 - 7.5   Color, UA Yellow Yellow   Appearance Ur Clear Clear   Leukocytes,UA Negative Negative   Protein,UA 2+ (A) Negative/Trace   Glucose, UA Negative Negative   Ketones, UA Negative Negative   RBC, UA Negative Negative   Bilirubin, UA Negative Negative   Urobilinogen, Ur 0.2 0.2 - 1.0 mg/dL   Nitrite, UA Negative Negative    Microscopic Examination See below:     Comment: Microscopic was indicated and was performed.   Urinalysis Reflex Comment     Comment: This specimen will not reflex to a Urine Culture.  Microscopic Examination     Status: Abnormal   Collection Time: 08/21/21  3:52 PM   Urine  Result Value Ref Range   WBC, UA 0-5 0 -  5 /hpf   RBC 0-2 0 - 2 /hpf   Epithelial Cells (non renal) >10 (A) 0 - 10 /hpf   Casts None seen None seen /lpf   Bacteria, UA None seen None seen/Few        Assessment/Plan: 1. Encounter for general adult medical examination with abnormal findings CPE performed, patient is up-to-date on mammogram and will discuss colonoscopy versus Cologuard with GI at upcoming visit.  Routine labs ordered and slip given to patient to take to next nephrology visit to see if this can be added to their labs, if not she will go through [facility]  2. Essential hypertension Elevated in office but patient has not taken medications today.  Discussed the importance of medication adherence and patient will continue on current medications  3. ESRD (end stage renal disease) on dialysis [facility]) On dialysis and followed by nephrology  4. Thoracic aortic aneurysm without rupture, unspecified part Followed by vascular surgery and patient will call to get an update on neck steps  5. Screening for colorectal cancer Will follow up with GI  6. Mixed hyperlipidemia - Lipid Panel With LDL/HDL Ratio  7. B12 deficiency - B12 and Folate Panel  8. Other fatigue - TSH + free T4  9. Dysuria - UA/M w/rflx Culture, Routine   General Counseling: Belynda verbalizes understanding of the findings of todays visit and agrees with plan of treatment. I have discussed any further diagnostic evaluation that may be needed or ordered today. We also reviewed her medications today. she has been encouraged to call the office with any questions or concerns that should arise related to todays  visit.    Counseling:    Orders Placed This Encounter  Procedures   Microscopic Examination   UA/M w/rflx Culture, Routine   TSH + free T4   Lipid Panel With LDL/HDL Ratio   B12 and Folate Panel    No orders of the defined types were placed in this encounter.   This patient was seen by Drema Dallas, PA-C in collaboration with Dr. Clayborn Bigness as a part of collaborative care agreement.  Total time spent:35 Minutes  Time spent includes review of chart, medications, test results, and follow up plan with the patient.     Lavera Guise, MD  Internal Medicine

## 2021-08-22 LAB — UA/M W/RFLX CULTURE, ROUTINE
Bilirubin, UA: NEGATIVE
Glucose, UA: NEGATIVE
Ketones, UA: NEGATIVE
Leukocytes,UA: NEGATIVE
Nitrite, UA: NEGATIVE
RBC, UA: NEGATIVE
Specific Gravity, UA: 1.014 (ref 1.005–1.030)
Urobilinogen, Ur: 0.2 mg/dL (ref 0.2–1.0)
pH, UA: 6.5 (ref 5.0–7.5)

## 2021-08-22 LAB — MICROSCOPIC EXAMINATION
Bacteria, UA: NONE SEEN
Casts: NONE SEEN /lpf
Epithelial Cells (non renal): >10 /hpf — AB (ref 0–10)

## 2021-09-13 DIAGNOSIS — N186 End stage renal disease: Secondary | ICD-10-CM | POA: Diagnosis not present

## 2021-09-13 DIAGNOSIS — Z992 Dependence on renal dialysis: Secondary | ICD-10-CM | POA: Diagnosis not present

## 2021-09-14 DIAGNOSIS — D509 Iron deficiency anemia, unspecified: Secondary | ICD-10-CM | POA: Diagnosis not present

## 2021-09-14 DIAGNOSIS — Z992 Dependence on renal dialysis: Secondary | ICD-10-CM | POA: Diagnosis not present

## 2021-09-14 DIAGNOSIS — N186 End stage renal disease: Secondary | ICD-10-CM | POA: Diagnosis not present

## 2021-09-14 DIAGNOSIS — D631 Anemia in chronic kidney disease: Secondary | ICD-10-CM | POA: Diagnosis not present

## 2021-09-14 DIAGNOSIS — Z23 Encounter for immunization: Secondary | ICD-10-CM | POA: Diagnosis not present

## 2021-09-27 ENCOUNTER — Telehealth: Payer: Self-pay | Admitting: Gastroenterology

## 2021-09-27 ENCOUNTER — Ambulatory Visit: Payer: PPO | Admitting: Gastroenterology

## 2021-09-27 NOTE — Telephone Encounter (Signed)
Pt called and left a VM. I returned pt's call to cxl her appt. Pt hung up.

## 2021-10-03 ENCOUNTER — Ambulatory Visit: Payer: PPO | Admitting: Gastroenterology

## 2021-10-14 DIAGNOSIS — N186 End stage renal disease: Secondary | ICD-10-CM | POA: Diagnosis not present

## 2021-10-14 DIAGNOSIS — Z992 Dependence on renal dialysis: Secondary | ICD-10-CM | POA: Diagnosis not present

## 2021-10-15 DIAGNOSIS — D631 Anemia in chronic kidney disease: Secondary | ICD-10-CM | POA: Diagnosis not present

## 2021-10-15 DIAGNOSIS — Z23 Encounter for immunization: Secondary | ICD-10-CM | POA: Diagnosis not present

## 2021-10-15 DIAGNOSIS — N186 End stage renal disease: Secondary | ICD-10-CM | POA: Diagnosis not present

## 2021-10-15 DIAGNOSIS — D509 Iron deficiency anemia, unspecified: Secondary | ICD-10-CM | POA: Diagnosis not present

## 2021-10-15 DIAGNOSIS — Z992 Dependence on renal dialysis: Secondary | ICD-10-CM | POA: Diagnosis not present

## 2021-11-09 DIAGNOSIS — I7123 Aneurysm of the descending thoracic aorta, without rupture: Secondary | ICD-10-CM | POA: Diagnosis not present

## 2021-11-09 DIAGNOSIS — N189 Chronic kidney disease, unspecified: Secondary | ICD-10-CM | POA: Diagnosis not present

## 2021-11-09 DIAGNOSIS — I7102 Dissection of abdominal aorta: Secondary | ICD-10-CM | POA: Diagnosis not present

## 2021-11-09 DIAGNOSIS — I716 Thoracoabdominal aortic aneurysm, without rupture, unspecified: Secondary | ICD-10-CM | POA: Diagnosis not present

## 2021-11-09 DIAGNOSIS — I129 Hypertensive chronic kidney disease with stage 1 through stage 4 chronic kidney disease, or unspecified chronic kidney disease: Secondary | ICD-10-CM | POA: Diagnosis not present

## 2021-11-09 DIAGNOSIS — Z992 Dependence on renal dialysis: Secondary | ICD-10-CM | POA: Diagnosis not present

## 2021-11-14 DIAGNOSIS — N186 End stage renal disease: Secondary | ICD-10-CM | POA: Diagnosis not present

## 2021-11-14 DIAGNOSIS — Z992 Dependence on renal dialysis: Secondary | ICD-10-CM | POA: Diagnosis not present

## 2021-11-15 DIAGNOSIS — D631 Anemia in chronic kidney disease: Secondary | ICD-10-CM | POA: Diagnosis not present

## 2021-11-15 DIAGNOSIS — D509 Iron deficiency anemia, unspecified: Secondary | ICD-10-CM | POA: Diagnosis not present

## 2021-11-15 DIAGNOSIS — N186 End stage renal disease: Secondary | ICD-10-CM | POA: Diagnosis not present

## 2021-11-15 DIAGNOSIS — Z992 Dependence on renal dialysis: Secondary | ICD-10-CM | POA: Diagnosis not present

## 2021-11-21 DIAGNOSIS — I714 Abdominal aortic aneurysm, without rupture, unspecified: Secondary | ICD-10-CM | POA: Diagnosis not present

## 2021-11-21 DIAGNOSIS — N2581 Secondary hyperparathyroidism of renal origin: Secondary | ICD-10-CM | POA: Diagnosis not present

## 2021-11-21 DIAGNOSIS — E1122 Type 2 diabetes mellitus with diabetic chronic kidney disease: Secondary | ICD-10-CM | POA: Diagnosis not present

## 2021-11-21 DIAGNOSIS — Z992 Dependence on renal dialysis: Secondary | ICD-10-CM | POA: Diagnosis not present

## 2021-11-21 DIAGNOSIS — I270 Primary pulmonary hypertension: Secondary | ICD-10-CM | POA: Diagnosis not present

## 2021-11-21 DIAGNOSIS — N186 End stage renal disease: Secondary | ICD-10-CM | POA: Diagnosis not present

## 2021-12-07 DIAGNOSIS — I7100 Dissection of unspecified site of aorta: Secondary | ICD-10-CM | POA: Diagnosis not present

## 2021-12-07 DIAGNOSIS — I716 Thoracoabdominal aortic aneurysm, without rupture, unspecified: Secondary | ICD-10-CM | POA: Diagnosis not present

## 2021-12-12 DIAGNOSIS — N186 End stage renal disease: Secondary | ICD-10-CM | POA: Diagnosis not present

## 2021-12-12 DIAGNOSIS — Z992 Dependence on renal dialysis: Secondary | ICD-10-CM | POA: Diagnosis not present

## 2021-12-13 DIAGNOSIS — N186 End stage renal disease: Secondary | ICD-10-CM | POA: Diagnosis not present

## 2021-12-13 DIAGNOSIS — D509 Iron deficiency anemia, unspecified: Secondary | ICD-10-CM | POA: Diagnosis not present

## 2021-12-13 DIAGNOSIS — Z992 Dependence on renal dialysis: Secondary | ICD-10-CM | POA: Diagnosis not present

## 2021-12-13 DIAGNOSIS — D631 Anemia in chronic kidney disease: Secondary | ICD-10-CM | POA: Diagnosis not present

## 2021-12-18 ENCOUNTER — Other Ambulatory Visit: Payer: Self-pay

## 2021-12-18 ENCOUNTER — Encounter: Payer: Self-pay | Admitting: Physician Assistant

## 2021-12-18 ENCOUNTER — Ambulatory Visit (INDEPENDENT_AMBULATORY_CARE_PROVIDER_SITE_OTHER): Payer: PPO | Admitting: Physician Assistant

## 2021-12-18 DIAGNOSIS — I10 Essential (primary) hypertension: Secondary | ICD-10-CM | POA: Diagnosis not present

## 2021-12-18 DIAGNOSIS — I712 Thoracic aortic aneurysm, without rupture, unspecified: Secondary | ICD-10-CM | POA: Diagnosis not present

## 2021-12-18 DIAGNOSIS — Z992 Dependence on renal dialysis: Secondary | ICD-10-CM

## 2021-12-18 DIAGNOSIS — N186 End stage renal disease: Secondary | ICD-10-CM

## 2021-12-18 MED ORDER — BISOPROLOL FUMARATE 10 MG PO TABS: 10.0000 mg | ORAL_TABLET | Freq: Every day | ORAL | 1 refills | Status: DC

## 2021-12-18 MED ORDER — AMLODIPINE BESYLATE 10 MG PO TABS: 10.0000 mg | ORAL_TABLET | Freq: Every day | ORAL | 1 refills | Status: DC

## 2021-12-18 NOTE — Progress Notes (Cosign Needed)
[facility] [place], [place] [address]  Internal MEDICINE  Office Visit Note  Patient Name: Elizabeth Roy  [id number]  [id number]  Date of Service: 12/18/2021  Chief Complaint  Patient presents with   Follow-up   Hyperlipidemia   Hypertension    HPI Pt is here for routine follow up -Did not have labs done but will go now.  She states nephrology did some lab work but unfortunately I am unable to see those results at this time.  States she has a follow-up in a few weeks to review these. 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 She is supposed to call back to their office regarding whether to move forward with surgery as recommended.  She states her biggest hesitancy is worrying about the recovery time and how this will impact her ability to complete her own dialysis.  Advised her to to go ahead and contact office to ask these questions in order to move forward with surgical planning -nephrology increased her sensipar to 60mg , she continues to do 30mg  currently but is supposed to be increasing -Did not go to GI appt due to scheduling problem and has not called back. She was taking miralax, but couldn't tolerate frequently, changed to stool softener. Sometimes uses hemorrhoid wipes to help due to some burning/skin irritation. No recent blood that she has noticed but is admitted that she has not been checking as regularly.  She is additionally supposed to be seeing GI for discussion on how best to complete colon cancer screening as she may not be a candidate for colonoscopy given other health conditions.  She is going to call back to set up appointment given she is having some changes in stool and irritation as well as history of bleeding  Current Medication: Outpatient Encounter Medications as of 12/18/2021  Medication Sig   cinacalcet (SENSIPAR) 30 MG tablet Take 60 mg by mouth daily.   Olopatadine HCl 0.2 % SOLN Place 1 drop  into both eyes daily.   [DISCONTINUED] amLODipine (NORVASC) 10 MG tablet Take 1 tablet (10 mg total) by mouth daily.   [DISCONTINUED] bisoprolol (ZEBETA) 10 MG tablet Take 1 tablet (10 mg total) by mouth daily.   amLODipine (NORVASC) 10 MG tablet Take 1 tablet (10 mg total) by mouth daily.   bisoprolol (ZEBETA) 10 MG tablet Take 1 tablet (10 mg total) by mouth daily.   No facility-administered encounter medications on file as of 12/18/2021.    Surgical History: Past Surgical History:  Procedure Laterality Date   BREAST BIOPSY Left 11/24/2020   Affirm bx-"X" clip-path pending   MOUTH SURGERY      Medical History: Past Medical History:  Diagnosis Date   Arthritis    Hypertension    Renal disorder     Family History: Family History  Problem Relation Age of Onset   Hypertension Mother    Intellectual disability Sister    Stroke Sister     Social History   Socioeconomic History   Marital status: Widowed    Spouse name: Not on file   Number of children: Not on file   Years of education: Not on file   Highest education level: Not on file  Occupational History   Not on file  Tobacco Use   Smoking status: Former    Types: Cigarettes   Smokeless tobacco: Never  Substance and Sexual Activity   Alcohol use: Never   Drug use: No   Sexual activity:  Not on file  Other Topics Concern   Not on file  Social History Narrative   Not on file   Social Determinants of Health   Financial Resource Strain: Not on file  Food Insecurity: Not on file  Transportation Needs: Not on file  Physical Activity: Not on file  Stress: Not on file  Social Connections: Not on file  Intimate Partner Violence: Not on file      Review of Systems  Constitutional:  Negative for chills, fatigue and unexpected weight change.  HENT:  Negative for congestion, postnasal drip, rhinorrhea, sneezing and sore throat.   Eyes:  Negative for redness.  Respiratory:  Negative for cough, chest tightness  and shortness of breath.   Cardiovascular:  Negative for chest pain and palpitations.  Gastrointestinal:  Positive for constipation. Negative for abdominal pain, diarrhea, nausea and vomiting.  Genitourinary:  Negative for dysuria and frequency.  Musculoskeletal:  Negative for arthralgias, back pain, joint swelling and neck pain.  Skin:  Negative for rash.  Neurological: Negative.  Negative for tremors and numbness.  Hematological:  Negative for adenopathy. Does not bruise/bleed easily.  Psychiatric/Behavioral:  Negative for behavioral problems (Depression), sleep disturbance and suicidal ideas. The patient is not nervous/anxious.    Vital Signs: BP 121/85    Pulse (!) 52    Temp 97.6 F (36.4 C)    Resp 16    Ht 4\' 11"  (1.499 m)    Wt 138 lb 9.6 oz (62.9 kg)    SpO2 99%    BMI 27.99 kg/m    Physical Exam Vitals and nursing note reviewed.  Constitutional:      General: She is not in acute distress.    Appearance: She is well-developed. She is not diaphoretic.  HENT:     Head: Normocephalic and atraumatic.     Right Ear: External ear normal.     Left Ear: External ear normal.     Nose: Nose normal.     Mouth/Throat:     Pharynx: No oropharyngeal exudate.  Eyes:     General: No scleral icterus.    Conjunctiva/sclera: Conjunctivae normal.     Pupils: Pupils are equal, round, and reactive to light.  Neck:     Thyroid: No thyromegaly.     Vascular: No JVD.     Trachea: No tracheal deviation.  Cardiovascular:     Rate and Rhythm: Normal rate and regular rhythm.     Heart sounds: Normal heart sounds. No murmur heard.   No friction rub. No gallop.  Pulmonary:     Effort: Pulmonary effort is normal. No respiratory distress.     Breath sounds: Normal breath sounds. No stridor. No wheezing or rales.  Chest:     Chest wall: No tenderness.  Abdominal:     General: Bowel sounds are normal.     Palpations: Abdomen is soft.  Musculoskeletal:        General: No tenderness or  deformity. Normal range of motion.     Cervical back: Normal range of motion and neck supple.  Lymphadenopathy:     Cervical: No cervical adenopathy.  Skin:    General: Skin is warm and dry.     Coloration: Skin is not pale.     Findings: No erythema or rash.  Neurological:     Mental Status: She is alert.     Cranial Nerves: No cranial nerve deficit.     Motor: No abnormal muscle tone.     Coordination: Coordination  normal.     Deep Tendon Reflexes: Reflexes are normal and symmetric.  Psychiatric:        Behavior: Behavior normal.        Thought Content: Thought content normal.        Judgment: Judgment normal.       Assessment/Plan: 1. Essential hypertension Stable, continue current medication - amLODipine (NORVASC) 10 MG tablet; Take 1 tablet (10 mg total) by mouth daily.  Dispense: 90 tablet; Refill: 1 - bisoprolol (ZEBETA) 10 MG tablet; Take 1 tablet (10 mg total) by mouth daily.  Dispense: 90 tablet; Refill: 1  2. ESRD (end stage renal disease) on dialysis [facility]) Followed by nephrology  3. Thoracic aortic aneurysm without rupture, unspecified part Followed by vascular surgery.  Patient will follow-up with their office in regards to making decision for surgery due to enlargement of aneurysm   General Counseling: Micky verbalizes understanding of the findings of todays visit and agrees with plan of treatment. I have discussed any further diagnostic evaluation that may be needed or ordered today. We also reviewed her medications today. she has been encouraged to call the office with any questions or concerns that should arise related to todays visit.    No orders of the defined types were placed in this encounter.   Meds ordered this encounter  Medications   amLODipine (NORVASC) 10 MG tablet    Sig: Take 1 tablet (10 mg total) by mouth daily.    Dispense:  90 tablet    Refill:  1   bisoprolol (ZEBETA) 10 MG tablet    Sig: Take 1 tablet (10 mg total) by mouth daily.     Dispense:  90 tablet    Refill:  1    This patient was seen by Drema Dallas, PA-C in collaboration with Dr. Clayborn Bigness as a part of collaborative care agreement.   Total time spent:30 Minutes Time spent includes review of chart, medications, test results, and follow up plan with the patient.      Dr Lavera Guise Internal medicine

## 2022-01-12 DIAGNOSIS — N186 End stage renal disease: Secondary | ICD-10-CM | POA: Diagnosis not present

## 2022-01-12 DIAGNOSIS — Z992 Dependence on renal dialysis: Secondary | ICD-10-CM | POA: Diagnosis not present

## 2022-01-13 DIAGNOSIS — N186 End stage renal disease: Secondary | ICD-10-CM | POA: Diagnosis not present

## 2022-01-13 DIAGNOSIS — D631 Anemia in chronic kidney disease: Secondary | ICD-10-CM | POA: Diagnosis not present

## 2022-01-13 DIAGNOSIS — D509 Iron deficiency anemia, unspecified: Secondary | ICD-10-CM | POA: Diagnosis not present

## 2022-01-13 DIAGNOSIS — Z992 Dependence on renal dialysis: Secondary | ICD-10-CM | POA: Diagnosis not present

## 2022-02-11 DIAGNOSIS — Z992 Dependence on renal dialysis: Secondary | ICD-10-CM | POA: Diagnosis not present

## 2022-02-11 DIAGNOSIS — N186 End stage renal disease: Secondary | ICD-10-CM | POA: Diagnosis not present

## 2022-02-12 DIAGNOSIS — Z992 Dependence on renal dialysis: Secondary | ICD-10-CM | POA: Diagnosis not present

## 2022-02-12 DIAGNOSIS — N186 End stage renal disease: Secondary | ICD-10-CM | POA: Diagnosis not present

## 2022-02-12 DIAGNOSIS — D509 Iron deficiency anemia, unspecified: Secondary | ICD-10-CM | POA: Diagnosis not present

## 2022-02-12 DIAGNOSIS — D631 Anemia in chronic kidney disease: Secondary | ICD-10-CM | POA: Diagnosis not present

## 2022-02-27 DIAGNOSIS — Z87891 Personal history of nicotine dependence: Secondary | ICD-10-CM | POA: Diagnosis not present

## 2022-02-27 DIAGNOSIS — N186 End stage renal disease: Secondary | ICD-10-CM | POA: Diagnosis not present

## 2022-02-27 DIAGNOSIS — I120 Hypertensive chronic kidney disease with stage 5 chronic kidney disease or end stage renal disease: Secondary | ICD-10-CM | POA: Diagnosis not present

## 2022-02-27 DIAGNOSIS — Z992 Dependence on renal dialysis: Secondary | ICD-10-CM | POA: Diagnosis not present

## 2022-03-14 DIAGNOSIS — N186 End stage renal disease: Secondary | ICD-10-CM | POA: Diagnosis not present

## 2022-03-14 DIAGNOSIS — Z992 Dependence on renal dialysis: Secondary | ICD-10-CM | POA: Diagnosis not present

## 2022-03-15 DIAGNOSIS — N186 End stage renal disease: Secondary | ICD-10-CM | POA: Diagnosis not present

## 2022-03-15 DIAGNOSIS — Z992 Dependence on renal dialysis: Secondary | ICD-10-CM | POA: Diagnosis not present

## 2022-03-15 DIAGNOSIS — D509 Iron deficiency anemia, unspecified: Secondary | ICD-10-CM | POA: Diagnosis not present

## 2022-03-15 DIAGNOSIS — D631 Anemia in chronic kidney disease: Secondary | ICD-10-CM | POA: Diagnosis not present

## 2022-04-04 ENCOUNTER — Telehealth: Payer: Self-pay

## 2022-04-04 NOTE — Telephone Encounter (Signed)
Medical record payment request faxed to [employer] at [phone] and mailed requesting records to [employer] at [place] [address].

## 2022-04-13 DIAGNOSIS — Z992 Dependence on renal dialysis: Secondary | ICD-10-CM | POA: Diagnosis not present

## 2022-04-13 DIAGNOSIS — N186 End stage renal disease: Secondary | ICD-10-CM | POA: Diagnosis not present

## 2022-04-14 DIAGNOSIS — N186 End stage renal disease: Secondary | ICD-10-CM | POA: Diagnosis not present

## 2022-04-14 DIAGNOSIS — D631 Anemia in chronic kidney disease: Secondary | ICD-10-CM | POA: Diagnosis not present

## 2022-04-14 DIAGNOSIS — D509 Iron deficiency anemia, unspecified: Secondary | ICD-10-CM | POA: Diagnosis not present

## 2022-04-14 DIAGNOSIS — Z992 Dependence on renal dialysis: Secondary | ICD-10-CM | POA: Diagnosis not present

## 2022-04-16 ENCOUNTER — Ambulatory Visit (INDEPENDENT_AMBULATORY_CARE_PROVIDER_SITE_OTHER): Payer: PPO | Admitting: Physician Assistant

## 2022-04-16 ENCOUNTER — Encounter: Payer: Self-pay | Admitting: Physician Assistant

## 2022-04-16 DIAGNOSIS — Z992 Dependence on renal dialysis: Secondary | ICD-10-CM

## 2022-04-16 DIAGNOSIS — K625 Hemorrhage of anus and rectum: Secondary | ICD-10-CM

## 2022-04-16 DIAGNOSIS — Z1212 Encounter for screening for malignant neoplasm of rectum: Secondary | ICD-10-CM

## 2022-04-16 DIAGNOSIS — I10 Essential (primary) hypertension: Secondary | ICD-10-CM | POA: Diagnosis not present

## 2022-04-16 DIAGNOSIS — Z1211 Encounter for screening for malignant neoplasm of colon: Secondary | ICD-10-CM

## 2022-04-16 DIAGNOSIS — I712 Thoracic aortic aneurysm, without rupture, unspecified: Secondary | ICD-10-CM

## 2022-04-16 DIAGNOSIS — N186 End stage renal disease: Secondary | ICD-10-CM

## 2022-04-16 NOTE — Progress Notes (Signed)
[facility] [place], [place] [address]  Internal MEDICINE  Office Visit Note  Patient Name: Elizabeth Roy  [id number]  [id number]  Date of Service: 04/24/2022  Chief Complaint  Patient presents with   Follow-up   Hypertension    HPI Pt is here for routine follow up -Has not contacted vascular yet regarding aneurysm and knows she needs to do so -BP at home has been stable -Never called GI back to reschedule, only a little blood on tissue last week and unsure if any in stool. Would like new referral to discuss this and possible colon cancer screening -BP stable -Continues to do home dialysis -still needs to have fasting labs done  Current Medication: Outpatient Encounter Medications as of 04/16/2022  Medication Sig   amLODipine (NORVASC) 10 MG tablet Take 1 tablet (10 mg total) by mouth daily.   bisoprolol (ZEBETA) 10 MG tablet Take 1 tablet (10 mg total) by mouth daily.   cinacalcet (SENSIPAR) 60 MG tablet Take 60 mg by mouth daily.   Olopatadine HCl 0.2 % SOLN Place 1 drop into both eyes daily.   No facility-administered encounter medications on file as of 04/16/2022.    Surgical History: Past Surgical History:  Procedure Laterality Date   BREAST BIOPSY Left 11/24/2020   Affirm bx-"X" clip-path pending   MOUTH SURGERY      Medical History: Past Medical History:  Diagnosis Date   Arthritis    Hypertension    Renal disorder     Family History: Family History  Problem Relation Age of Onset   Hypertension Mother    Intellectual disability Sister    Stroke Sister     Social History   Socioeconomic History   Marital status: Widowed    Spouse name: Not on file   Number of children: Not on file   Years of education: Not on file   Highest education level: Not on file  Occupational History   Not on file  Tobacco Use   Smoking status: Former    Types: Cigarettes   Smokeless tobacco: Never  Substance and Sexual Activity   Alcohol use:  Never   Drug use: No   Sexual activity: Not on file  Other Topics Concern   Not on file  Social History Narrative   Not on file   Social Determinants of Health   Financial Resource Strain: Not on file  Food Insecurity: Not on file  Transportation Needs: Not on file  Physical Activity: Not on file  Stress: Not on file  Social Connections: Not on file  Intimate Partner Violence: Not on file      Review of Systems  Constitutional:  Negative for chills, fatigue and unexpected weight change.  HENT:  Negative for congestion, postnasal drip, rhinorrhea, sneezing and sore throat.   Eyes:  Negative for redness.  Respiratory:  Negative for cough, chest tightness and shortness of breath.   Cardiovascular:  Negative for chest pain and palpitations.  Gastrointestinal:  Positive for anal bleeding and constipation. Negative for abdominal pain, diarrhea, nausea and vomiting.  Genitourinary:  Negative for dysuria and frequency.  Musculoskeletal:  Negative for arthralgias, back pain, joint swelling and neck pain.  Skin:  Negative for rash.  Neurological: Negative.  Negative for tremors and numbness.  Hematological:  Negative for adenopathy. Does not bruise/bleed easily.  Psychiatric/Behavioral:  Negative for behavioral problems (Depression), sleep disturbance and suicidal ideas. The patient is not nervous/anxious.     Vital Signs: BP 138/80  Pulse 77   Temp 97.8 F (36.6 C)   Resp 16   Ht 4\' 11"  (1.499 m)   Wt 139 lb 6.4 oz (63.2 kg)   SpO2 100%   BMI 28.16 kg/m    Physical Exam Vitals and nursing note reviewed.  Constitutional:      General: She is not in acute distress.    Appearance: She is well-developed. She is not diaphoretic.  HENT:     Head: Normocephalic and atraumatic.     Right Ear: External ear normal.     Left Ear: External ear normal.     Nose: Nose normal.     Mouth/Throat:     Pharynx: No oropharyngeal exudate.  Eyes:     General: No scleral icterus.     Conjunctiva/sclera: Conjunctivae normal.     Pupils: Pupils are equal, round, and reactive to light.  Neck:     Thyroid: No thyromegaly.     Vascular: No JVD.     Trachea: No tracheal deviation.  Cardiovascular:     Rate and Rhythm: Normal rate and regular rhythm.     Heart sounds: Normal heart sounds. No murmur heard.    No friction rub. No gallop.  Pulmonary:     Effort: Pulmonary effort is normal. No respiratory distress.     Breath sounds: Normal breath sounds. No stridor. No wheezing or rales.  Chest:     Chest wall: No tenderness.  Abdominal:     General: Bowel sounds are normal.     Palpations: Abdomen is soft.  Musculoskeletal:        General: No tenderness or deformity. Normal range of motion.     Cervical back: Normal range of motion and neck supple.  Lymphadenopathy:     Cervical: No cervical adenopathy.  Skin:    General: Skin is warm and dry.     Coloration: Skin is not pale.     Findings: No erythema or rash.  Neurological:     Mental Status: She is alert.     Cranial Nerves: No cranial nerve deficit.     Motor: No abnormal muscle tone.     Coordination: Coordination normal.     Deep Tendon Reflexes: Reflexes are normal and symmetric.  Psychiatric:        Behavior: Behavior normal.        Thought Content: Thought content normal.        Judgment: Judgment normal.        Assessment/Plan: 1. Essential hypertension Stable, continue current medications  2. ESRD (end stage renal disease) on dialysis [facility]) Followed by nephrology  3. Thoracic aortic aneurysm without rupture, unspecified part ([place]) Will follow up with vascular to plan for possible surgical repair  4. Screening for colorectal cancer - Ambulatory referral to Gastroenterology  5. BRBPR (bright red blood per rectum) - Ambulatory referral to Gastroenterology   General Counseling: Almeta verbalizes understanding of the findings of todays visit and agrees with plan of treatment. I have  discussed any further diagnostic evaluation that may be needed or ordered today. We also reviewed her medications today. she has been encouraged to call the office with any questions or concerns that should arise related to todays visit.    Orders Placed This Encounter  Procedures   Ambulatory referral to Gastroenterology    No orders of the defined types were placed in this encounter.   This patient was seen by Drema Dallas, PA-C in collaboration with Dr. Clayborn Bigness as a  part of collaborative care agreement.   Total time spent:30 Minutes Time spent includes review of chart, medications, test results, and follow up plan with the patient.      Dr Lavera Guise Internal medicine

## 2022-04-19 ENCOUNTER — Telehealth: Payer: Self-pay

## 2022-04-19 NOTE — Telephone Encounter (Signed)
CALLED PATIENT NO ANSWER LEFT VOICEMAIL FOR A CALL BACK ? ?

## 2022-04-20 ENCOUNTER — Telehealth: Payer: Self-pay

## 2022-04-20 NOTE — Telephone Encounter (Signed)
error 

## 2022-04-26 ENCOUNTER — Telehealth: Payer: Self-pay

## 2022-04-26 NOTE — Telephone Encounter (Signed)
CALLED PATIENT NO ANSWER LEFT VOICEMAIL FOR A CALL BACK °Letter sent °

## 2022-05-04 ENCOUNTER — Telehealth: Payer: Self-pay

## 2022-05-04 NOTE — Telephone Encounter (Signed)
Left message for patient that her diabetic eye exam was normal on 05/04/2022.

## 2022-05-09 ENCOUNTER — Telehealth: Payer: Self-pay

## 2022-05-09 NOTE — Telephone Encounter (Signed)
Patient left a message on the colonoscopy scheduler phone to schedule a colonoscopy. Returned patient called and left a message for call back

## 2022-05-10 NOTE — Telephone Encounter (Signed)
Left vm wanting to schedule colonoscopy.

## 2022-05-14 DIAGNOSIS — N186 End stage renal disease: Secondary | ICD-10-CM | POA: Diagnosis not present

## 2022-05-14 DIAGNOSIS — Z992 Dependence on renal dialysis: Secondary | ICD-10-CM | POA: Diagnosis not present

## 2022-05-15 DIAGNOSIS — N186 End stage renal disease: Secondary | ICD-10-CM | POA: Diagnosis not present

## 2022-05-15 DIAGNOSIS — Z992 Dependence on renal dialysis: Secondary | ICD-10-CM | POA: Diagnosis not present

## 2022-05-15 DIAGNOSIS — D509 Iron deficiency anemia, unspecified: Secondary | ICD-10-CM | POA: Diagnosis not present

## 2022-05-15 DIAGNOSIS — D631 Anemia in chronic kidney disease: Secondary | ICD-10-CM | POA: Diagnosis not present

## 2022-06-03 ENCOUNTER — Other Ambulatory Visit: Payer: Self-pay | Admitting: Physician Assistant

## 2022-06-03 DIAGNOSIS — I10 Essential (primary) hypertension: Secondary | ICD-10-CM

## 2022-06-04 DIAGNOSIS — Z87891 Personal history of nicotine dependence: Secondary | ICD-10-CM | POA: Diagnosis not present

## 2022-06-04 DIAGNOSIS — Z992 Dependence on renal dialysis: Secondary | ICD-10-CM | POA: Diagnosis not present

## 2022-06-04 DIAGNOSIS — N186 End stage renal disease: Secondary | ICD-10-CM | POA: Diagnosis not present

## 2022-06-04 DIAGNOSIS — I120 Hypertensive chronic kidney disease with stage 5 chronic kidney disease or end stage renal disease: Secondary | ICD-10-CM | POA: Diagnosis not present

## 2022-06-04 DIAGNOSIS — I714 Abdominal aortic aneurysm, without rupture, unspecified: Secondary | ICD-10-CM | POA: Diagnosis not present

## 2022-06-04 DIAGNOSIS — I270 Primary pulmonary hypertension: Secondary | ICD-10-CM | POA: Diagnosis not present

## 2022-06-14 DIAGNOSIS — N186 End stage renal disease: Secondary | ICD-10-CM | POA: Diagnosis not present

## 2022-06-14 DIAGNOSIS — Z992 Dependence on renal dialysis: Secondary | ICD-10-CM | POA: Diagnosis not present

## 2022-06-15 DIAGNOSIS — Z23 Encounter for immunization: Secondary | ICD-10-CM | POA: Diagnosis not present

## 2022-06-15 DIAGNOSIS — D631 Anemia in chronic kidney disease: Secondary | ICD-10-CM | POA: Diagnosis not present

## 2022-06-15 DIAGNOSIS — N186 End stage renal disease: Secondary | ICD-10-CM | POA: Diagnosis not present

## 2022-06-15 DIAGNOSIS — D509 Iron deficiency anemia, unspecified: Secondary | ICD-10-CM | POA: Diagnosis not present

## 2022-06-15 DIAGNOSIS — Z992 Dependence on renal dialysis: Secondary | ICD-10-CM | POA: Diagnosis not present

## 2022-07-06 ENCOUNTER — Ambulatory Visit
Admission: RE | Admit: 2022-07-06 | Discharge: 2022-07-06 | Disposition: A | Discharge status: Home / Self Care | Payer: PPO | Source: Ambulatory Visit | Attending: Nephrology | Admitting: Nephrology

## 2022-07-06 ENCOUNTER — Other Ambulatory Visit: Payer: Self-pay | Admitting: Nephrology

## 2022-07-06 DIAGNOSIS — R109 Unspecified abdominal pain: Secondary | ICD-10-CM

## 2022-07-14 DIAGNOSIS — Z992 Dependence on renal dialysis: Secondary | ICD-10-CM | POA: Diagnosis not present

## 2022-07-14 DIAGNOSIS — N186 End stage renal disease: Secondary | ICD-10-CM | POA: Diagnosis not present

## 2022-07-15 DIAGNOSIS — N186 End stage renal disease: Secondary | ICD-10-CM | POA: Diagnosis not present

## 2022-07-15 DIAGNOSIS — D509 Iron deficiency anemia, unspecified: Secondary | ICD-10-CM | POA: Diagnosis not present

## 2022-07-15 DIAGNOSIS — D631 Anemia in chronic kidney disease: Secondary | ICD-10-CM | POA: Diagnosis not present

## 2022-07-15 DIAGNOSIS — Z992 Dependence on renal dialysis: Secondary | ICD-10-CM | POA: Diagnosis not present

## 2022-07-31 DIAGNOSIS — Z0289 Encounter for other administrative examinations: Secondary | ICD-10-CM | POA: Diagnosis not present

## 2022-08-06 ENCOUNTER — Ambulatory Visit: Payer: PPO | Admitting: Physician Assistant

## 2022-08-14 DIAGNOSIS — N186 End stage renal disease: Secondary | ICD-10-CM | POA: Diagnosis not present

## 2022-08-14 DIAGNOSIS — Z992 Dependence on renal dialysis: Secondary | ICD-10-CM | POA: Diagnosis not present

## 2022-08-15 DIAGNOSIS — N186 End stage renal disease: Secondary | ICD-10-CM | POA: Diagnosis not present

## 2022-08-15 DIAGNOSIS — D509 Iron deficiency anemia, unspecified: Secondary | ICD-10-CM | POA: Diagnosis not present

## 2022-08-15 DIAGNOSIS — Z992 Dependence on renal dialysis: Secondary | ICD-10-CM | POA: Diagnosis not present

## 2022-08-15 DIAGNOSIS — Z23 Encounter for immunization: Secondary | ICD-10-CM | POA: Diagnosis not present

## 2022-08-15 DIAGNOSIS — D631 Anemia in chronic kidney disease: Secondary | ICD-10-CM | POA: Diagnosis not present

## 2022-08-27 ENCOUNTER — Ambulatory Visit (INDEPENDENT_AMBULATORY_CARE_PROVIDER_SITE_OTHER): Payer: PPO | Admitting: Physician Assistant

## 2022-08-27 ENCOUNTER — Encounter: Payer: Self-pay | Admitting: Physician Assistant

## 2022-08-27 VITALS — BP 138/90 | HR 64 | Temp 98.1°F | Resp 16 | Ht 59.0 in | Wt 137.6 lb

## 2022-08-27 DIAGNOSIS — Z1231 Encounter for screening mammogram for malignant neoplasm of breast: Secondary | ICD-10-CM

## 2022-08-27 DIAGNOSIS — R300 Dysuria: Secondary | ICD-10-CM | POA: Diagnosis not present

## 2022-08-27 DIAGNOSIS — I10 Essential (primary) hypertension: Secondary | ICD-10-CM

## 2022-08-27 DIAGNOSIS — Z992 Dependence on renal dialysis: Secondary | ICD-10-CM

## 2022-08-27 DIAGNOSIS — I712 Thoracic aortic aneurysm, without rupture, unspecified: Secondary | ICD-10-CM | POA: Diagnosis not present

## 2022-08-27 DIAGNOSIS — N186 End stage renal disease: Secondary | ICD-10-CM | POA: Diagnosis not present

## 2022-08-27 DIAGNOSIS — Z0001 Encounter for general adult medical examination with abnormal findings: Secondary | ICD-10-CM | POA: Diagnosis not present

## 2022-08-27 MED ORDER — AMLODIPINE BESYLATE 10 MG PO TABS: 10.0000 mg | ORAL_TABLET | Freq: Every day | ORAL | 1 refills | Status: DC

## 2022-08-27 NOTE — Progress Notes (Signed)
[facility] [place], [place] [address]  Internal MEDICINE  Office Visit Note  Patient Name: Elizabeth Roy  [id number]  [id number]  Date of Service: 09/04/2022  Chief Complaint  Patient presents with   Medicare Wellness   Hypertension     HPI Pt is here for routine health maintenance examination -States her nephrologist added irbesartan 150mg  she thinks -BP at home 125/82 -has labs done with nephrologist but no records available in office and she will try to obtain copy.  -Continues to do dialysis -Some blood on tissue with wiping still, but no changes or blood in stool. Has appt with GI next month to consider if colon screening needed and evaluate this. -has not been back to see vasc surgery still and knows the importance of this -Due for mammogram  Current Medication: Outpatient Encounter Medications as of 08/27/2022  Medication Sig   bisoprolol (ZEBETA) 10 MG tablet TAKE 1 TABLET BY MOUTH EVERY DAY   cinacalcet (SENSIPAR) 60 MG tablet Take 60 mg by mouth daily.   irbesartan (AVAPRO) 150 MG tablet Take 150 mg by mouth daily.   Olopatadine HCl 0.2 % SOLN Place 1 drop into both eyes daily.   [DISCONTINUED] amLODipine (NORVASC) 10 MG tablet Take 1 tablet (10 mg total) by mouth daily.   amLODipine (NORVASC) 10 MG tablet Take 1 tablet (10 mg total) by mouth daily.   No facility-administered encounter medications on file as of 08/27/2022.    Surgical History: Past Surgical History:  Procedure Laterality Date   BREAST BIOPSY Left 11/24/2020   Affirm bx-"X" clip-path pending   MOUTH SURGERY      Medical History: Past Medical History:  Diagnosis Date   Arthritis    Hypertension    Renal disorder     Family History: Family History  Problem Relation Age of Onset   Hypertension Mother    Intellectual disability Sister    Stroke Sister       Review of Systems  Constitutional:  Negative for chills, fatigue and unexpected weight change.   HENT:  Negative for congestion, postnasal drip, rhinorrhea, sneezing and sore throat.   Eyes:  Negative for redness.  Respiratory:  Negative for cough, chest tightness and shortness of breath.   Cardiovascular:  Negative for chest pain and palpitations.  Gastrointestinal:  Positive for anal bleeding and constipation. Negative for abdominal pain, diarrhea, nausea and vomiting.  Genitourinary:  Negative for dysuria and frequency.  Musculoskeletal:  Negative for arthralgias, back pain, joint swelling and neck pain.  Skin:  Negative for rash.  Neurological: Negative.  Negative for tremors and numbness.  Hematological:  Negative for adenopathy. Does not bruise/bleed easily.  Psychiatric/Behavioral:  Negative for behavioral problems (Depression), sleep disturbance and suicidal ideas. The patient is not nervous/anxious.      Vital Signs: BP (!) 138/90 Comment: 142/93  Pulse 64   Temp 98.1 F (36.7 C)   Resp 16   Ht 4\' 11"  (1.499 m)   Wt 137 lb 9.6 oz (62.4 kg)   SpO2 99%   BMI 27.79 kg/m    Physical Exam Vitals and nursing note reviewed.  Constitutional:      General: She is not in acute distress.    Appearance: She is well-developed. She is not diaphoretic.  HENT:     Head: Normocephalic and atraumatic.     Right Ear: External ear normal.     Left Ear: External ear normal.     Nose: Nose normal.  Mouth/Throat:     Pharynx: No oropharyngeal exudate.  Eyes:     Pupils: Pupils are equal, round, and reactive to light.  Neck:     Thyroid: No thyromegaly.     Vascular: No JVD.     Trachea: No tracheal deviation.  Cardiovascular:     Rate and Rhythm: Normal rate and regular rhythm.     Heart sounds: Normal heart sounds. No murmur heard.    No friction rub. No gallop.  Pulmonary:     Effort: Pulmonary effort is normal. No respiratory distress.     Breath sounds: Normal breath sounds. No stridor. No wheezing or rales.  Chest:     Chest wall: No tenderness.  Abdominal:      General: Bowel sounds are normal.     Palpations: Abdomen is soft.     Tenderness: There is no abdominal tenderness.  Musculoskeletal:        General: No tenderness or deformity. Normal range of motion.     Cervical back: Normal range of motion and neck supple.  Lymphadenopathy:     Cervical: No cervical adenopathy.  Skin:    General: Skin is warm and dry.     Coloration: Skin is not pale.     Findings: No erythema or rash.  Neurological:     Mental Status: She is alert.     Cranial Nerves: No cranial nerve deficit.     Motor: No abnormal muscle tone.     Coordination: Coordination normal.     Deep Tendon Reflexes: Reflexes are normal and symmetric.  Psychiatric:        Behavior: Behavior normal.        Thought Content: Thought content normal.        Judgment: Judgment normal.      LABS: No results found for this or any previous visit (from the past 2160 hour(s)).      Assessment/Plan: 1. Encounter for general adult medical examination with abnormal findings CPE performed, due for mammogram  2. Essential hypertension Borderline in office, continue current medications - amLODipine (NORVASC) 10 MG tablet; Take 1 tablet (10 mg total) by mouth daily.  Dispense: 90 tablet; Refill: 1  3. ESRD (end stage renal disease) on dialysis [facility]) Followed by nephrology  4. Thoracic aortic aneurysm without rupture, unspecified part ([place]) Again discussed following up with vascular surgery  5. Visit for screening mammogram Will schedule mammogram due in Jan/feb  6. Dysuria - UA/M w/rflx Culture, Routine   General Counseling: [name] verbalizes understanding of the findings of todays visit and agrees with plan of treatment. I have discussed any further diagnostic evaluation that may be needed or ordered today. We also reviewed her medications today. she has been encouraged to call the office with any questions or concerns that should arise related to todays  visit.    Counseling:    Orders Placed This Encounter  Procedures   UA/M w/rflx Culture, Routine    Meds ordered this encounter  Medications   amLODipine (NORVASC) 10 MG tablet    Sig: Take 1 tablet (10 mg total) by mouth daily.    Dispense:  90 tablet    Refill:  1    This patient was seen by Drema Dallas, PA-C in collaboration with Dr. Clayborn Bigness as a part of collaborative care agreement.  Total time spent:35 Minutes  Time spent includes review of chart, medications, test results, and follow up plan with the patient.     Lavera Guise,  MD  Internal Medicine

## 2022-09-12 ENCOUNTER — Telehealth: Payer: Self-pay

## 2022-09-12 NOTE — Patient Outreach (Signed)
  Care Coordination   Initial Visit Note   09/12/2022 Name: Elizabeth Roy MRN: [id number] DOB: August 06, 1956  Elizabeth Roy is a 66 y.o. year old female who sees McDonough, Si Gaul, PA-C for primary care. I spoke with  Elizabeth Roy by phone today.  What matters to the patients health and wellness today?  Patient states she does not have any nursing or community resource needs at this time.      Goals Addressed             This Visit's Progress    Care coordination activities - no follow up needed       Care Coordination Interventions: Care coordination program/ services discussed  Social determinants of health survey completed Patient advised to contact primary care provider office  if care coordination services needed in the future           SDOH assessments and interventions completed:  Yes  SDOH Interventions Today    Flowsheet Row Most Recent Value  SDOH Interventions   Food Insecurity Interventions Intervention Not Indicated  Housing Interventions Intervention Not Indicated  Transportation Interventions Intervention Not Indicated        Care Coordination Interventions:  Yes, provided   Follow up plan: No further intervention required.   Encounter Outcome:  Pt. Visit Completed   Quinn Plowman RN,BSN,CCM [place] [phone] direct line

## 2022-09-13 DIAGNOSIS — Z992 Dependence on renal dialysis: Secondary | ICD-10-CM | POA: Diagnosis not present

## 2022-09-13 DIAGNOSIS — N186 End stage renal disease: Secondary | ICD-10-CM | POA: Diagnosis not present

## 2022-09-14 DIAGNOSIS — N186 End stage renal disease: Secondary | ICD-10-CM | POA: Diagnosis not present

## 2022-09-14 DIAGNOSIS — Z992 Dependence on renal dialysis: Secondary | ICD-10-CM | POA: Diagnosis not present

## 2022-09-14 DIAGNOSIS — D509 Iron deficiency anemia, unspecified: Secondary | ICD-10-CM | POA: Diagnosis not present

## 2022-09-14 DIAGNOSIS — D631 Anemia in chronic kidney disease: Secondary | ICD-10-CM | POA: Diagnosis not present

## 2022-09-24 ENCOUNTER — Encounter: Payer: Self-pay | Admitting: Gastroenterology

## 2022-09-24 ENCOUNTER — Other Ambulatory Visit: Payer: Self-pay

## 2022-09-24 ENCOUNTER — Ambulatory Visit (INDEPENDENT_AMBULATORY_CARE_PROVIDER_SITE_OTHER): Payer: PPO | Admitting: Gastroenterology

## 2022-09-24 VITALS — BP 142/84 | HR 60 | Temp 97.8°F | Ht 59.0 in | Wt 137.5 lb

## 2022-09-24 DIAGNOSIS — Z1211 Encounter for screening for malignant neoplasm of colon: Secondary | ICD-10-CM | POA: Diagnosis not present

## 2022-09-24 NOTE — Progress Notes (Signed)
Jonathon Bellows MD, MRCP(U.K) [address]  [place]  [place], [place] [address]  Main: [phone]  Fax: [phone]   Gastroenterology Consultation  Referring Provider:     Mylinda Latina, PA* Primary Care Physician:  Mylinda Latina, PA-C Primary Gastroenterologist:  Dr. Jonathon Bellows  Reason for Consultation:     Colonoscopy        HPI:   Elizabeth Roy is a 66 y.o. y/o female here to see me for a colonoscopy.  Referred in July 2023 for a colonoscopy.  She is here today to see me with her knees.  She says she has had her last colonoscopy over 10 years back.  Family history is only significant for her sister who may have had colon polyps.  At the time of making the appointment she had noticed some blood on the tissue paper but has since not had a recurrence. no change in bowel habits.  No other complaints Past Medical History:  Diagnosis Date   Arthritis    Hypertension    Renal disorder     Past Surgical History:  Procedure Laterality Date   BREAST BIOPSY Left 11/24/2020   Affirm bx-"X" clip-path pending   MOUTH SURGERY      Prior to Admission medications   Medication Sig Start Date End Date Taking? Authorizing Provider  amLODipine (NORVASC) 10 MG tablet Take 1 tablet (10 mg total) by mouth daily. 08/27/22   McDonough, Si Gaul, PA-C  bisoprolol-hydrochlorothiazide (ZIAC) 5-6.25 MG tablet Take 1 tablet by mouth daily.    [provider]  cinacalcet (SENSIPAR) 60 MG tablet Take 60 mg by mouth daily.    [provider]  ergocalciferol, VITAMIN D2, (DRISDOL) 200 MCG/ML drops Take 2,000 Units by mouth daily. 01/25/20   [provider]  lodoxamide (ALOMIDE) 0.1 % ophthalmic solution Place 1 drop into both eyes 4 (four) times daily. 03/12/17   [provider]  nystatin cream (MYCOSTATIN) Apply 1 Application topically 3 (three) times daily.    [provider]  ondansetron (ZOFRAN) 4 MG tablet Take 4 mg by mouth every 8  (eight) hours as needed.    [provider]  oxyCODONE-acetaminophen (PERCOCET/ROXICET) 5-325 MG tablet PLEASE SEE ATTACHED FOR DETAILED DIRECTIONS    [provider]  predniSONE (DELTASONE) 10 MG tablet Take 10 mg by mouth 2 (two) times daily with a meal.    [provider]  sodium bicarbonate 650 MG tablet Take 650 mg by mouth 2 (two) times daily.    [provider]  terbinafine (LAMISIL) 250 MG tablet Take 250 mg by mouth daily.    [provider]  torsemide (DEMADEX) 20 MG tablet Take 20 mg by mouth once. 01/11/20   [provider]  traMADol (ULTRAM) 50 MG tablet Take 50 mg by mouth every 6 (six) hours as needed.    [provider]  valsartan (DIOVAN) 320 MG tablet Take 320 mg by mouth daily.    [provider]    Family History  Problem Relation Age of Onset   Hypertension Mother    Intellectual disability Sister    Stroke Sister      Social History   Tobacco Use   Smoking status: Former    Types: Cigarettes   Smokeless tobacco: Never  Substance Use Topics   Alcohol use: Never   Drug use: No    Allergies as of 09/24/2022 - Review Complete 09/24/2022  Allergen Reaction Noted   Latex Itching  03/21/2021    Review of Systems:    All systems reviewed and negative except where noted in HPI.   Physical Exam:  BP (!) 142/84   Pulse 60   Temp 97.8 F (36.6 C) (Oral)   Ht 4\' 11"  (1.499 m)   Wt 137 lb 8 oz (62.4 kg)   BMI 27.77 kg/m  No LMP recorded. Patient is postmenopausal. Psych:  Alert and cooperative. Normal mood and affect. General:   Alert,  Well-developed, well-nourished, pleasant and cooperative in NAD Head:  Normocephalic and atraumatic.   Neurologic:  Alert and oriented x3;  grossly normal neurologically. Psych:  Alert and cooperative. Normal mood and affect.  Imaging Studies: No results found.  Assessment and Plan:   Elizabeth Roy is a 66 y.o. y/o female has been referred for  colonoscopy.  She is on dialysis possible family history of colon polyps discussed about the procedure explained about the prep she says she will discuss about it with her family will be provided all information today and she will call us back with an appointment date if she decides to proceed.  I have discussed alternative options, risks & benefits,  which include, but are not limited to, bleeding, infection, perforation,respiratory complication & drug reaction.  The patient agrees with this plan & written consent will be obtained.     Follow up in as needed  Dr Jonathon Bellows MD,MRCP(U.K)

## 2022-09-24 NOTE — Patient Instructions (Signed)
Please give [place] a call if you decide on scheduling your colonoscopy.  Take care and Happy Holidays!

## 2022-10-14 DIAGNOSIS — N186 End stage renal disease: Secondary | ICD-10-CM | POA: Diagnosis not present

## 2022-10-14 DIAGNOSIS — Z992 Dependence on renal dialysis: Secondary | ICD-10-CM | POA: Diagnosis not present

## 2022-10-15 DIAGNOSIS — D509 Iron deficiency anemia, unspecified: Secondary | ICD-10-CM | POA: Diagnosis not present

## 2022-10-15 DIAGNOSIS — D631 Anemia in chronic kidney disease: Secondary | ICD-10-CM | POA: Diagnosis not present

## 2022-10-15 DIAGNOSIS — Z992 Dependence on renal dialysis: Secondary | ICD-10-CM | POA: Diagnosis not present

## 2022-10-15 DIAGNOSIS — N186 End stage renal disease: Secondary | ICD-10-CM | POA: Diagnosis not present

## 2022-11-14 DIAGNOSIS — Z992 Dependence on renal dialysis: Secondary | ICD-10-CM | POA: Diagnosis not present

## 2022-11-14 DIAGNOSIS — N186 End stage renal disease: Secondary | ICD-10-CM | POA: Diagnosis not present

## 2022-11-15 DIAGNOSIS — Z992 Dependence on renal dialysis: Secondary | ICD-10-CM | POA: Diagnosis not present

## 2022-11-15 DIAGNOSIS — D509 Iron deficiency anemia, unspecified: Secondary | ICD-10-CM | POA: Diagnosis not present

## 2022-11-15 DIAGNOSIS — D631 Anemia in chronic kidney disease: Secondary | ICD-10-CM | POA: Diagnosis not present

## 2022-11-15 DIAGNOSIS — N186 End stage renal disease: Secondary | ICD-10-CM | POA: Diagnosis not present

## 2022-12-13 DIAGNOSIS — Z992 Dependence on renal dialysis: Secondary | ICD-10-CM | POA: Diagnosis not present

## 2022-12-13 DIAGNOSIS — N186 End stage renal disease: Secondary | ICD-10-CM | POA: Diagnosis not present

## 2022-12-14 DIAGNOSIS — N186 End stage renal disease: Secondary | ICD-10-CM | POA: Diagnosis not present

## 2022-12-14 DIAGNOSIS — D509 Iron deficiency anemia, unspecified: Secondary | ICD-10-CM | POA: Diagnosis not present

## 2022-12-14 DIAGNOSIS — D631 Anemia in chronic kidney disease: Secondary | ICD-10-CM | POA: Diagnosis not present

## 2022-12-14 DIAGNOSIS — Z992 Dependence on renal dialysis: Secondary | ICD-10-CM | POA: Diagnosis not present

## 2022-12-24 ENCOUNTER — Ambulatory Visit (INDEPENDENT_AMBULATORY_CARE_PROVIDER_SITE_OTHER): Payer: PPO | Admitting: Physician Assistant

## 2022-12-24 ENCOUNTER — Encounter: Payer: Self-pay | Admitting: Physician Assistant

## 2022-12-24 VITALS — BP 137/82 | HR 61 | Temp 97.8°F | Resp 16 | Ht 59.0 in | Wt 139.0 lb

## 2022-12-24 DIAGNOSIS — N186 End stage renal disease: Secondary | ICD-10-CM | POA: Diagnosis not present

## 2022-12-24 DIAGNOSIS — I712 Thoracic aortic aneurysm, without rupture, unspecified: Secondary | ICD-10-CM

## 2022-12-24 DIAGNOSIS — Z1231 Encounter for screening mammogram for malignant neoplasm of breast: Secondary | ICD-10-CM

## 2022-12-24 DIAGNOSIS — I10 Essential (primary) hypertension: Secondary | ICD-10-CM | POA: Diagnosis not present

## 2022-12-24 DIAGNOSIS — Z992 Dependence on renal dialysis: Secondary | ICD-10-CM

## 2022-12-24 NOTE — Progress Notes (Signed)
[facility] [place], [place] [address]  Internal MEDICINE  Office Visit Note  Patient Name: Elizabeth Roy  [id number]  [id number]  Date of Service: 12/24/2022  Chief Complaint  Patient presents with   Follow-up   Hypertension   Quality Metric Gaps    Mammogram    HPI Pt is here for routine follow up -She did have visit with GI to discuss some bleeding she has been having and is due for colonoscopy, but has not called to schedule procedure yet -She continues to follow with nephrology, on dialysis -still needs to follow up with vascular for aneurysm. Again discussed importance and to go ahead and call today so she doesn't forget/put off longer. She states her nephew was just asking her yesterday about whether she had called -BP stable -had labs with nephrology but these are not available. She will bring copies -due for mammogram -She will check on tdap and consider shingles vaccine. States nephrology also mentioned this  Current Medication: Outpatient Encounter Medications as of 12/24/2022  Medication Sig   amLODipine (NORVASC) 10 MG tablet Take 1 tablet (10 mg total) by mouth daily.   bisoprolol-hydrochlorothiazide (ZIAC) 5-6.25 MG tablet Take 1 tablet by mouth daily.   cinacalcet (SENSIPAR) 60 MG tablet Take 60 mg by mouth daily.   ergocalciferol, VITAMIN D2, (DRISDOL) 200 MCG/ML drops Take 2,000 Units by mouth daily.   irbesartan (AVAPRO) 150 MG tablet Take 150 mg by mouth daily.   torsemide (DEMADEX) 20 MG tablet Take 20 mg by mouth daily. Two tablets every morning   [DISCONTINUED] furosemide (LASIX) 20 MG tablet Take 0.5 tablets by mouth daily.   [DISCONTINUED] lodoxamide (ALOMIDE) 0.1 % ophthalmic solution Place 1 drop into both eyes 4 (four) times daily.   [DISCONTINUED] nystatin cream (MYCOSTATIN) Apply 1 Application topically 3 (three) times daily.   [DISCONTINUED] olmesartan (BENICAR) 40 MG tablet Take 40 mg by mouth daily.   [DISCONTINUED]  ondansetron (ZOFRAN) 4 MG tablet Take 4 mg by mouth every 8 (eight) hours as needed.   [DISCONTINUED] oxyCODONE-acetaminophen (PERCOCET/ROXICET) 5-325 MG tablet PLEASE SEE ATTACHED FOR DETAILED DIRECTIONS   [DISCONTINUED] predniSONE (DELTASONE) 10 MG tablet Take 10 mg by mouth 2 (two) times daily with a meal.   [DISCONTINUED] sodium bicarbonate 650 MG tablet Take 650 mg by mouth 2 (two) times daily.   [DISCONTINUED] terbinafine (LAMISIL) 250 MG tablet Take 250 mg by mouth daily.   [DISCONTINUED] traMADol (ULTRAM) 50 MG tablet Take 50 mg by mouth every 6 (six) hours as needed.   [DISCONTINUED] valsartan (DIOVAN) 320 MG tablet Take 320 mg by mouth daily.   No facility-administered encounter medications on file as of 12/24/2022.    Surgical History: Past Surgical History:  Procedure Laterality Date   BREAST BIOPSY Left 11/24/2020   Affirm bx-"X" clip-path pending   MOUTH SURGERY      Medical History: Past Medical History:  Diagnosis Date   Arthritis    Hypertension    Renal disorder     Family History: Family History  Problem Relation Age of Onset   Hypertension Mother    Intellectual disability Sister    Stroke Sister     Social History   Socioeconomic History   Marital status: Widowed    Spouse name: Not on file   Number of children: Not on file   Years of education: Not on file   Highest education level: Not on file  Occupational History   Not on file  Tobacco Use  Smoking status: Former    Types: Cigarettes   Smokeless tobacco: Never  Substance and Sexual Activity   Alcohol use: Never   Drug use: No   Sexual activity: Not on file  Other Topics Concern   Not on file  Social History Narrative   Not on file   Social Determinants of Health   Financial Resource Strain: Not on file  Food Insecurity: No Food Insecurity (09/12/2022)   Hunger Vital Sign    Worried About Running Out of Food in the Last Year: Never true    Ran Out of Food in the Last Year: Never  true  Transportation Needs: No Transportation Needs (09/12/2022)   PRAPARE - Hydrologist (Medical): No    Lack of Transportation (Non-Medical): No  Physical Activity: Not on file  Stress: Not on file  Social Connections: Not on file  Intimate Partner Violence: Not on file      Review of Systems  Constitutional:  Negative for chills, fatigue and unexpected weight change.  HENT:  Negative for congestion, postnasal drip, rhinorrhea, sneezing and sore throat.   Eyes:  Negative for redness.  Respiratory:  Negative for cough, chest tightness and shortness of breath.   Cardiovascular:  Negative for chest pain and palpitations.  Gastrointestinal:  Positive for constipation. Negative for abdominal pain, diarrhea, nausea and vomiting.  Genitourinary:  Negative for dysuria and frequency.  Musculoskeletal:  Negative for arthralgias, back pain, joint swelling and neck pain.  Skin:  Negative for rash.  Neurological: Negative.  Negative for tremors and numbness.  Hematological:  Negative for adenopathy. Does not bruise/bleed easily.  Psychiatric/Behavioral:  Negative for behavioral problems (Depression), sleep disturbance and suicidal ideas. The patient is not nervous/anxious.     Vital Signs: BP 137/82   Pulse 61   Temp 97.8 F (36.6 C)   Resp 16   Ht '4\' 11"'$  (1.499 m)   Wt 139 lb (63 kg)   SpO2 99%   BMI 28.07 kg/m    Physical Exam Vitals and nursing note reviewed.  Constitutional:      General: She is not in acute distress.    Appearance: She is well-developed. She is not diaphoretic.  HENT:     Head: Normocephalic and atraumatic.     Right Ear: External ear normal.     Left Ear: External ear normal.     Nose: Nose normal.     Mouth/Throat:     Pharynx: No oropharyngeal exudate.  Eyes:     Pupils: Pupils are equal, round, and reactive to light.  Neck:     Thyroid: No thyromegaly.     Vascular: No JVD.     Trachea: No tracheal deviation.   Cardiovascular:     Rate and Rhythm: Normal rate and regular rhythm.     Heart sounds: Normal heart sounds. No murmur heard.    No friction rub. No gallop.  Pulmonary:     Effort: Pulmonary effort is normal. No respiratory distress.     Breath sounds: Normal breath sounds. No stridor. No wheezing or rales.  Chest:     Chest wall: No tenderness.  Abdominal:     General: Bowel sounds are normal.     Palpations: Abdomen is soft.  Musculoskeletal:        General: No tenderness or deformity. Normal range of motion.     Cervical back: Normal range of motion and neck supple.  Lymphadenopathy:     Cervical: No cervical  adenopathy.  Skin:    General: Skin is warm and dry.     Coloration: Skin is not pale.     Findings: No erythema or rash.  Neurological:     Mental Status: She is alert.     Cranial Nerves: No cranial nerve deficit.     Motor: No abnormal muscle tone.     Coordination: Coordination normal.     Deep Tendon Reflexes: Reflexes are normal and symmetric.  Psychiatric:        Behavior: Behavior normal.        Thought Content: Thought content normal.        Judgment: Judgment normal.        Assessment/Plan: 1. Essential hypertension Stable, continue current medications  2. ESRD (end stage renal disease) on dialysis [facility]) Followed by nephrology  3. Thoracic aortic aneurysm without rupture, unspecified part ([place]) Again discussed importance of following up with vascular and will call today  4. Visit for screening mammogram Will schedule mammogram   General Counseling: Glorianne verbalizes understanding of the findings of todays visit and agrees with plan of treatment. I have discussed any further diagnostic evaluation that may be needed or ordered today. We also reviewed her medications today. she has been encouraged to call the office with any questions or concerns that should arise related to todays visit.    No orders of the defined types were placed in this  encounter.   No orders of the defined types were placed in this encounter.   This patient was seen by Drema Dallas, PA-C in collaboration with Dr. Clayborn Bigness as a part of collaborative care agreement.   Total time spent:30 Minutes Time spent includes review of chart, medications, test results, and follow up plan with the patient.      Dr Lavera Guise Internal medicine

## 2023-01-05 ENCOUNTER — Other Ambulatory Visit: Payer: Self-pay | Admitting: Physician Assistant

## 2023-01-05 DIAGNOSIS — I10 Essential (primary) hypertension: Secondary | ICD-10-CM

## 2023-01-13 DIAGNOSIS — Z992 Dependence on renal dialysis: Secondary | ICD-10-CM | POA: Diagnosis not present

## 2023-01-13 DIAGNOSIS — N186 End stage renal disease: Secondary | ICD-10-CM | POA: Diagnosis not present

## 2023-01-14 DIAGNOSIS — D631 Anemia in chronic kidney disease: Secondary | ICD-10-CM | POA: Diagnosis not present

## 2023-01-14 DIAGNOSIS — Z992 Dependence on renal dialysis: Secondary | ICD-10-CM | POA: Diagnosis not present

## 2023-01-14 DIAGNOSIS — N186 End stage renal disease: Secondary | ICD-10-CM | POA: Diagnosis not present

## 2023-01-14 DIAGNOSIS — D509 Iron deficiency anemia, unspecified: Secondary | ICD-10-CM | POA: Diagnosis not present

## 2023-01-21 ENCOUNTER — Telehealth: Payer: Self-pay | Admitting: Physician Assistant

## 2023-01-21 ENCOUNTER — Other Ambulatory Visit: Payer: Self-pay

## 2023-01-21 DIAGNOSIS — I10 Essential (primary) hypertension: Secondary | ICD-10-CM

## 2023-01-21 MED ORDER — AMLODIPINE BESYLATE 10 MG PO TABS
10.0000 mg | ORAL_TABLET | Freq: Every day | ORAL | 1 refills | Status: DC
Start: 2023-01-21 — End: 2023-12-30

## 2023-01-21 NOTE — Telephone Encounter (Signed)
Try to call pt no answer send amlodipine

## 2023-01-25 ENCOUNTER — Other Ambulatory Visit: Payer: Self-pay

## 2023-01-25 ENCOUNTER — Telehealth: Payer: Self-pay

## 2023-01-25 MED ORDER — BISOPROLOL FUMARATE 10 MG PO TABS: 10.0000 mg | ORAL_TABLET | Freq: Every day | ORAL | 1 refills | Status: DC

## 2023-01-25 NOTE — Telephone Encounter (Signed)
As per lauren change to Bisoprolol 10 mg and send pres

## 2023-02-11 ENCOUNTER — Ambulatory Visit: Payer: Self-pay

## 2023-02-11 NOTE — Patient Outreach (Signed)
  Care Coordination   02/11/2023 Name: Elizabeth Roy MRN: [id number] DOB: 02/08/1956   Care Coordination Outreach Attempts:  A second unsuccessful outreach was attempted today to offer the patient with information about available care coordination services as a benefit of their health plan.     Follow Up Plan:  Additional outreach attempts will be made to offer the patient care coordination information and services.   Encounter Outcome:  No Answer   Care Coordination Interventions:  No, not indicated    SIG Lysle Morales, BSW Social Worker [facility] Care Management  [phone]

## 2023-02-12 DIAGNOSIS — Z992 Dependence on renal dialysis: Secondary | ICD-10-CM | POA: Diagnosis not present

## 2023-02-12 DIAGNOSIS — N186 End stage renal disease: Secondary | ICD-10-CM | POA: Diagnosis not present

## 2023-02-13 ENCOUNTER — Ambulatory Visit: Payer: Self-pay

## 2023-02-13 ENCOUNTER — Telehealth: Payer: Self-pay | Admitting: Physician Assistant

## 2023-02-13 DIAGNOSIS — D631 Anemia in chronic kidney disease: Secondary | ICD-10-CM | POA: Diagnosis not present

## 2023-02-13 DIAGNOSIS — Z992 Dependence on renal dialysis: Secondary | ICD-10-CM | POA: Diagnosis not present

## 2023-02-13 DIAGNOSIS — D509 Iron deficiency anemia, unspecified: Secondary | ICD-10-CM | POA: Diagnosis not present

## 2023-02-13 DIAGNOSIS — N186 End stage renal disease: Secondary | ICD-10-CM | POA: Diagnosis not present

## 2023-02-13 NOTE — Patient Outreach (Signed)
  Care Coordination   02/13/2023 Name: Elizabeth Roy MRN: [id number] DOB: 01-30-1956   Care Coordination Outreach Attempts:  A third unsuccessful outreach was attempted today to offer the patient with information about available care coordination services.  Follow Up Plan:  No further outreach attempts will be made at this time. We have been unable to contact the patient to offer or enroll patient in care coordination services  Encounter Outcome:  No Answer   Care Coordination Interventions:  No, not indicated    SIG Lysle Morales, BSW Social Worker [facility] Care Management  [phone]

## 2023-02-13 NOTE — Telephone Encounter (Signed)
Received call from patient on after-hour line stating she missed call from someone at our office because our telephone # showed up on her phone. I explained to her the call was from [facility] caseworker, Mick Sell. Gave patient tele # [phone] to return her call-Toni

## 2023-03-15 DIAGNOSIS — N186 End stage renal disease: Secondary | ICD-10-CM | POA: Diagnosis not present

## 2023-03-15 DIAGNOSIS — Z992 Dependence on renal dialysis: Secondary | ICD-10-CM | POA: Diagnosis not present

## 2023-03-16 DIAGNOSIS — N186 End stage renal disease: Secondary | ICD-10-CM | POA: Diagnosis not present

## 2023-03-16 DIAGNOSIS — D631 Anemia in chronic kidney disease: Secondary | ICD-10-CM | POA: Diagnosis not present

## 2023-03-16 DIAGNOSIS — Z992 Dependence on renal dialysis: Secondary | ICD-10-CM | POA: Diagnosis not present

## 2023-03-16 DIAGNOSIS — D509 Iron deficiency anemia, unspecified: Secondary | ICD-10-CM | POA: Diagnosis not present

## 2023-04-14 DIAGNOSIS — Z992 Dependence on renal dialysis: Secondary | ICD-10-CM | POA: Diagnosis not present

## 2023-04-14 DIAGNOSIS — N186 End stage renal disease: Secondary | ICD-10-CM | POA: Diagnosis not present

## 2023-04-15 DIAGNOSIS — D509 Iron deficiency anemia, unspecified: Secondary | ICD-10-CM | POA: Diagnosis not present

## 2023-04-15 DIAGNOSIS — D631 Anemia in chronic kidney disease: Secondary | ICD-10-CM | POA: Diagnosis not present

## 2023-04-15 DIAGNOSIS — N186 End stage renal disease: Secondary | ICD-10-CM | POA: Diagnosis not present

## 2023-04-15 DIAGNOSIS — Z992 Dependence on renal dialysis: Secondary | ICD-10-CM | POA: Diagnosis not present

## 2023-04-25 ENCOUNTER — Ambulatory Visit: Payer: PPO | Admitting: Physician Assistant

## 2023-05-02 ENCOUNTER — Ambulatory Visit: Payer: PPO | Admitting: Physician Assistant

## 2023-05-03 ENCOUNTER — Encounter: Payer: Self-pay | Admitting: Physician Assistant

## 2023-05-03 ENCOUNTER — Ambulatory Visit (INDEPENDENT_AMBULATORY_CARE_PROVIDER_SITE_OTHER): Payer: PPO | Admitting: Physician Assistant

## 2023-05-03 VITALS — BP 130/86 | HR 61 | Temp 98.3°F | Resp 16 | Ht 59.0 in | Wt 133.0 lb

## 2023-05-03 DIAGNOSIS — I10 Essential (primary) hypertension: Secondary | ICD-10-CM | POA: Diagnosis not present

## 2023-05-03 DIAGNOSIS — Z992 Dependence on renal dialysis: Secondary | ICD-10-CM

## 2023-05-03 DIAGNOSIS — I712 Thoracic aortic aneurysm, without rupture, unspecified: Secondary | ICD-10-CM

## 2023-05-03 DIAGNOSIS — N186 End stage renal disease: Secondary | ICD-10-CM | POA: Diagnosis not present

## 2023-05-03 NOTE — Progress Notes (Signed)
[facility] [address] [place], [place] [address]  Internal MEDICINE  Office Visit Note  Patient Name: Elizabeth Roy  [id number]  [id number]  Date of Service: 05/03/2023  Chief Complaint  Patient presents with   Hypertension   Follow-up    HPI Pt is here for routine follow up -Was having a little blood on tissue when wiping after BM, but this resolved. Has had this before and was sent to GI and discussed colonoscopy but never called back to schedule. She will monitor, no blood seen in stool but will check this. Has used prepH in past to help with irritation from possible hemorrhoid. May follow up with GI  -Has not scheduled with vascular still for an appt regarding aneurysm. Given number for their office today as she is motivated in getting this addressed now, but the number she called didn't seem correct -has copy of labs from nephrology at home but forgot it. Will try to send pic via mychart/fax. Discussed If not obtained then labs will be ordered at CPE -BP stable  Current Medication: Outpatient Encounter Medications as of 05/03/2023  Medication Sig   amLODipine (NORVASC) 10 MG tablet Take 1 tablet (10 mg total) by mouth daily.   bisoprolol (ZEBETA) 10 MG tablet Take 1 tablet (10 mg total) by mouth daily.   calcitRIOL (ROCALTROL) 0.25 MCG capsule Take 0.25 mcg by mouth daily.   cinacalcet (SENSIPAR) 60 MG tablet Take 60 mg by mouth daily.   irbesartan (AVAPRO) 150 MG tablet Take 150 mg by mouth daily.   spironolactone (ALDACTONE) 25 MG tablet Take 25 mg by mouth daily.   torsemide (DEMADEX) 20 MG tablet Take 20 mg by mouth daily. Two tablets every morning   [DISCONTINUED] ergocalciferol, VITAMIN D2, (DRISDOL) 200 MCG/ML drops Take 2,000 Units by mouth daily.   No facility-administered encounter medications on file as of 05/03/2023.    Surgical History: Past Surgical History:  Procedure Laterality Date   BREAST BIOPSY Left 11/24/2020   Affirm bx-"X" clip-path  pending   MOUTH SURGERY      Medical History: Past Medical History:  Diagnosis Date   Arthritis    Hypertension    Renal disorder     Family History: Family History  Problem Relation Age of Onset   Hypertension Mother    Intellectual disability Sister    Stroke Sister     Social History   Socioeconomic History   Marital status: Widowed    Spouse name: Not on file   Number of children: Not on file   Years of education: Not on file   Highest education level: Not on file  Occupational History   Not on file  Tobacco Use   Smoking status: Former    Types: Cigarettes   Smokeless tobacco: Never  Substance and Sexual Activity   Alcohol use: Never   Drug use: No   Sexual activity: Not on file  Other Topics Concern   Not on file  Social History Narrative   Not on file   Social Determinants of Health   Financial Resource Strain: Not on file  Food Insecurity: No Food Insecurity (09/12/2022)   Hunger Vital Sign    Worried About Running Out of Food in the Last Year: Never true    Ran Out of Food in the Last Year: Never true  Transportation Needs: No Transportation Needs (09/12/2022)   PRAPARE - Administrator, Civil Service (Medical): No    Lack of Transportation (Non-Medical):  No  Physical Activity: Not on file  Stress: Not on file  Social Connections: Not on file  Intimate Partner Violence: Not on file      Review of Systems  Constitutional:  Negative for chills, fatigue and unexpected weight change.  HENT:  Negative for congestion, postnasal drip, rhinorrhea, sneezing and sore throat.   Eyes:  Negative for redness.  Respiratory:  Negative for cough, chest tightness and shortness of breath.   Cardiovascular:  Negative for chest pain and palpitations.  Gastrointestinal:  Positive for anal bleeding. Negative for abdominal pain, blood in stool, diarrhea, nausea and vomiting.  Genitourinary:  Negative for dysuria and frequency.  Musculoskeletal:   Negative for arthralgias, back pain, joint swelling and neck pain.  Skin:  Negative for rash.  Neurological: Negative.  Negative for tremors and numbness.  Hematological:  Negative for adenopathy. Does not bruise/bleed easily.  Psychiatric/Behavioral:  Negative for behavioral problems (Depression), sleep disturbance and suicidal ideas. The patient is not nervous/anxious.     Vital Signs: BP 130/86   Pulse 61   Temp 98.3 F (36.8 C)   Resp 16   Ht 4\' 11"  (1.499 m)   Wt 133 lb (60.3 kg)   SpO2 99%   BMI 26.86 kg/m    Physical Exam Vitals and nursing note reviewed.  Constitutional:      General: She is not in acute distress.    Appearance: She is well-developed. She is not diaphoretic.  HENT:     Head: Normocephalic and atraumatic.     Right Ear: External ear normal.     Left Ear: External ear normal.     Nose: Nose normal.     Mouth/Throat:     Pharynx: No oropharyngeal exudate.  Eyes:     Pupils: Pupils are equal, round, and reactive to light.  Neck:     Thyroid: No thyromegaly.     Vascular: No JVD.     Trachea: No tracheal deviation.  Cardiovascular:     Rate and Rhythm: Normal rate and regular rhythm.     Heart sounds: Normal heart sounds. No murmur heard.    No friction rub. No gallop.  Pulmonary:     Effort: Pulmonary effort is normal. No respiratory distress.     Breath sounds: Normal breath sounds. No stridor. No wheezing or rales.  Chest:     Chest wall: No tenderness.  Abdominal:     General: Bowel sounds are normal.     Palpations: Abdomen is soft.  Musculoskeletal:        General: No tenderness or deformity. Normal range of motion.     Cervical back: Normal range of motion and neck supple.  Lymphadenopathy:     Cervical: No cervical adenopathy.  Skin:    General: Skin is warm and dry.     Coloration: Skin is not pale.     Findings: No erythema or rash.  Neurological:     Mental Status: She is alert.     Cranial Nerves: No cranial nerve deficit.      Motor: No abnormal muscle tone.     Coordination: Coordination normal.     Deep Tendon Reflexes: Reflexes are normal and symmetric.  Psychiatric:        Behavior: Behavior normal.        Thought Content: Thought content normal.        Judgment: Judgment normal.        Assessment/Plan: 1. Thoracic aortic aneurysm without rupture, unspecified part ([facility])  Given phone number of Dr. Danae Orleans office who she last saw feb 2023 and needs follow up  2. Essential hypertension Stable, continue current medications  3. ESRD (end stage renal disease) on dialysis [facility]) Followed by nephrology, will have labs faxed/sent via mychart   General Counseling: Falesha verbalizes understanding of the findings of todays visit and agrees with plan of treatment. I have discussed any further diagnostic evaluation that may be needed or ordered today. We also reviewed her medications today. she has been encouraged to call the office with any questions or concerns that should arise related to todays visit.    No orders of the defined types were placed in this encounter.   No orders of the defined types were placed in this encounter.   This patient was seen by Lynn Ito, PA-C in collaboration with Dr. Beverely Risen as a part of collaborative care agreement.   Total time spent:30 Minutes Time spent includes review of chart, medications, test results, and follow up plan with the patient.      Dr Lyndon Code Internal medicine

## 2023-05-15 DIAGNOSIS — N186 End stage renal disease: Secondary | ICD-10-CM | POA: Diagnosis not present

## 2023-05-15 DIAGNOSIS — Z992 Dependence on renal dialysis: Secondary | ICD-10-CM | POA: Diagnosis not present

## 2023-05-16 DIAGNOSIS — Z992 Dependence on renal dialysis: Secondary | ICD-10-CM | POA: Diagnosis not present

## 2023-05-16 DIAGNOSIS — N186 End stage renal disease: Secondary | ICD-10-CM | POA: Diagnosis not present

## 2023-06-06 DIAGNOSIS — R188 Other ascites: Secondary | ICD-10-CM | POA: Diagnosis not present

## 2023-06-06 DIAGNOSIS — I7123 Aneurysm of the descending thoracic aorta, without rupture: Secondary | ICD-10-CM | POA: Diagnosis not present

## 2023-06-06 DIAGNOSIS — I7100 Dissection of unspecified site of aorta: Secondary | ICD-10-CM | POA: Diagnosis not present

## 2023-06-06 DIAGNOSIS — I716 Thoracoabdominal aortic aneurysm, without rupture, unspecified: Secondary | ICD-10-CM | POA: Diagnosis not present

## 2023-06-15 DIAGNOSIS — Z992 Dependence on renal dialysis: Secondary | ICD-10-CM | POA: Diagnosis not present

## 2023-06-15 DIAGNOSIS — N186 End stage renal disease: Secondary | ICD-10-CM | POA: Diagnosis not present

## 2023-06-16 DIAGNOSIS — D631 Anemia in chronic kidney disease: Secondary | ICD-10-CM | POA: Diagnosis not present

## 2023-06-16 DIAGNOSIS — N186 End stage renal disease: Secondary | ICD-10-CM | POA: Diagnosis not present

## 2023-06-16 DIAGNOSIS — D509 Iron deficiency anemia, unspecified: Secondary | ICD-10-CM | POA: Diagnosis not present

## 2023-06-16 DIAGNOSIS — Z992 Dependence on renal dialysis: Secondary | ICD-10-CM | POA: Diagnosis not present

## 2023-07-15 DIAGNOSIS — N186 End stage renal disease: Secondary | ICD-10-CM | POA: Diagnosis not present

## 2023-07-15 DIAGNOSIS — Z992 Dependence on renal dialysis: Secondary | ICD-10-CM | POA: Diagnosis not present

## 2023-07-16 DIAGNOSIS — D631 Anemia in chronic kidney disease: Secondary | ICD-10-CM | POA: Diagnosis not present

## 2023-07-16 DIAGNOSIS — Z992 Dependence on renal dialysis: Secondary | ICD-10-CM | POA: Diagnosis not present

## 2023-07-16 DIAGNOSIS — N186 End stage renal disease: Secondary | ICD-10-CM | POA: Diagnosis not present

## 2023-07-16 DIAGNOSIS — D509 Iron deficiency anemia, unspecified: Secondary | ICD-10-CM | POA: Diagnosis not present

## 2023-08-03 ENCOUNTER — Other Ambulatory Visit: Payer: Self-pay | Admitting: Physician Assistant

## 2023-08-15 DIAGNOSIS — N186 End stage renal disease: Secondary | ICD-10-CM | POA: Diagnosis not present

## 2023-08-15 DIAGNOSIS — Z992 Dependence on renal dialysis: Secondary | ICD-10-CM | POA: Diagnosis not present

## 2023-08-16 DIAGNOSIS — Z992 Dependence on renal dialysis: Secondary | ICD-10-CM | POA: Diagnosis not present

## 2023-08-16 DIAGNOSIS — N186 End stage renal disease: Secondary | ICD-10-CM | POA: Diagnosis not present

## 2023-08-16 DIAGNOSIS — D631 Anemia in chronic kidney disease: Secondary | ICD-10-CM | POA: Diagnosis not present

## 2023-09-02 ENCOUNTER — Ambulatory Visit (INDEPENDENT_AMBULATORY_CARE_PROVIDER_SITE_OTHER): Payer: PPO | Admitting: Physician Assistant

## 2023-09-02 ENCOUNTER — Encounter: Payer: Self-pay | Admitting: Physician Assistant

## 2023-09-02 ENCOUNTER — Telehealth: Payer: Self-pay | Admitting: Physician Assistant

## 2023-09-02 VITALS — BP 124/76 | HR 57 | Temp 98.4°F | Resp 16 | Ht 59.0 in | Wt 133.8 lb

## 2023-09-02 DIAGNOSIS — Z1231 Encounter for screening mammogram for malignant neoplasm of breast: Secondary | ICD-10-CM

## 2023-09-02 DIAGNOSIS — Z0000 Encounter for general adult medical examination without abnormal findings: Secondary | ICD-10-CM

## 2023-09-02 NOTE — Addendum Note (Signed)
Addended by: Lynn Ito on: 09/02/2023 02:20 PM   Modules accepted: Orders

## 2023-09-02 NOTE — Progress Notes (Signed)
[facility] [address] [place], [place] [address]  Internal MEDICINE  Office Visit Note  Patient Name: Elizabeth Roy  [id number]  [id number]  Date of Service: 09/02/2023  Chief Complaint  Patient presents with   Hypertension   Medicare Wellness    HPI Ife presents for an annual well visit and physical exam.  Well-appearing 67 y.o. female  Routine CRC screening: due for colon screening, did cologuard in 2021 was unsure if she wants to try for colonoscopy and wants to discuss with nephrology regarding her dialysis. Had met with GI 1 year ago to discuss colonoscopy given she had some rectal bleeding at that time as well. Pt never scheduled colonoscopy though and is still hesitant. Will discuss with nephrology this week and call to schedule Routine mammogram: Never scheduled follow up mammogram last year since biopsy done in 2022. Will order today Labs: will bring labs from nephrology, if not then new labs will need to be ordered Other concerns: Did go back to see vascular in August but is not sure how she is going to proceed regarding TAAA. She is supposed to call back when she decides     09/02/2023   11:14 AM 08/27/2022   11:14 AM 08/21/2021    3:22 PM  MMSE - Mini Mental State Exam  Orientation to time 5 5 5   Orientation to Place 5 5 5   Registration 3 3 3   Attention/ Calculation 5 5 5   Recall 3 3 3   Language- name 2 objects 2 2 2   Language- repeat 1 1 1   Language- follow 3 step command 3 3 3   Language- read & follow direction 1 1 1   Write a sentence 0 0 1  Copy design 1 1 1   Total score 29 29 30     Functional Status Survey: Is the patient deaf or have difficulty hearing?: No Does the patient have difficulty seeing, even when wearing glasses/contacts?: No Does the patient have difficulty concentrating, remembering, or making decisions?: No Does the patient have difficulty walking or climbing stairs?: No Does the patient have difficulty dressing or  bathing?: No Does the patient have difficulty doing errands alone such as visiting a doctor's office or shopping?: No     04/16/2022    1:53 PM 08/27/2022   11:12 AM 12/24/2022   11:37 AM 05/03/2023   11:52 AM 09/02/2023   11:12 AM  Fall Risk  Falls in the past year? 0 0 0 0 0  Was there an injury with Fall?  0  0 0  Fall Risk Category Calculator  0  0 0  Fall Risk Category (Retired)  Low     (RETIRED) Patient Fall Risk Level  Low fall risk     Patient at Risk for Falls Due to No Fall Risks No Fall Risks  No Fall Risks No Fall Risks  Fall risk Follow up Falls evaluation completed Falls evaluation completed  Falls evaluation completed Falls evaluation completed       09/02/2023   11:12 AM  Depression screen PHQ 2/9  Decreased Interest 0  Down, Depressed, Hopeless 0  PHQ - 2 Score 0        No data to display            Current Medication: Outpatient Encounter Medications as of 09/02/2023  Medication Sig   amLODipine (NORVASC) 10 MG tablet Take 1 tablet (10 mg total) by mouth daily.   bisoprolol (ZEBETA) 10 MG tablet TAKE 1 TABLET BY  MOUTH EVERY DAY   calcitRIOL (ROCALTROL) 0.25 MCG capsule Take 0.25 mcg by mouth daily.   cinacalcet (SENSIPAR) 60 MG tablet Take 60 mg by mouth daily.   irbesartan (AVAPRO) 150 MG tablet Take 150 mg by mouth daily.   spironolactone (ALDACTONE) 25 MG tablet Take 25 mg by mouth daily.   torsemide (DEMADEX) 20 MG tablet Take 20 mg by mouth daily. Two tablets every morning   No facility-administered encounter medications on file as of 09/02/2023.    Surgical History: Past Surgical History:  Procedure Laterality Date   BREAST BIOPSY Left 11/24/2020   Affirm bx-"X" clip-path pending   MOUTH SURGERY      Medical History: Past Medical History:  Diagnosis Date   Arthritis    Hypertension    Renal disorder     Family History: Family History  Problem Relation Age of Onset   Hypertension Mother    Intellectual disability Sister     Stroke Sister     Social History   Socioeconomic History   Marital status: Widowed    Spouse name: Not on file   Number of children: Not on file   Years of education: Not on file   Highest education level: Not on file  Occupational History   Not on file  Tobacco Use   Smoking status: Former    Types: Cigarettes   Smokeless tobacco: Never  Substance and Sexual Activity   Alcohol use: Never   Drug use: No   Sexual activity: Not on file  Other Topics Concern   Not on file  Social History Narrative   Not on file   Social Determinants of Health   Financial Resource Strain: Not on file  Food Insecurity: No Food Insecurity (09/12/2022)   Hunger Vital Sign    Worried About Running Out of Food in the Last Year: Never true    Ran Out of Food in the Last Year: Never true  Transportation Needs: No Transportation Needs (09/12/2022)   PRAPARE - Administrator, Civil Service (Medical): No    Lack of Transportation (Non-Medical): No  Physical Activity: Not on file  Stress: Not on file  Social Connections: Not on file  Intimate Partner Violence: Not on file      Review of Systems  Constitutional:  Negative for chills, fatigue and unexpected weight change.  HENT:  Negative for congestion, postnasal drip, rhinorrhea, sneezing and sore throat.   Eyes:  Negative for redness.  Respiratory:  Negative for cough, chest tightness and shortness of breath.   Cardiovascular:  Negative for chest pain and palpitations.  Gastrointestinal:  Negative for abdominal pain, diarrhea, nausea and vomiting.  Genitourinary:  Negative for dysuria and frequency.  Musculoskeletal:  Negative for arthralgias, back pain, joint swelling and neck pain.  Skin:  Negative for rash.  Neurological: Negative.  Negative for tremors and numbness.  Hematological:  Negative for adenopathy. Does not bruise/bleed easily.  Psychiatric/Behavioral:  Negative for behavioral problems (Depression), sleep disturbance  and suicidal ideas. The patient is not nervous/anxious.     Vital Signs: BP 124/76   Pulse (!) 57   Temp 98.4 F (36.9 C)   Resp 16   Ht 4\' 11"  (1.499 m)   Wt 133 lb 12.8 oz (60.7 kg)   SpO2 98%   BMI 27.02 kg/m    Physical Exam Vitals and nursing note reviewed.  Constitutional:      Appearance: Normal appearance.  HENT:     Head: Normocephalic.  Eyes:     Pupils: Pupils are equal, round, and reactive to light.  Cardiovascular:     Rate and Rhythm: Normal rate and regular rhythm.  Pulmonary:     Effort: Pulmonary effort is normal.     Breath sounds: Normal breath sounds.  Neurological:     Mental Status: She is alert.  Psychiatric:        Behavior: Behavior normal.        Assessment/Plan: 1. Medicare annual wellness visit, subsequent AWV performed, pt recently had labs with nephrology and will drop off copy. May need to order separate labs. Due for mammogram and colonoscopy--pt will call to schedule  2. Visit for screening mammogram - MM DIGITAL SCREENING BILATERAL; Future     General Counseling: Zhania verbalizes understanding of the findings of todays visit and agrees with plan of treatment. I have discussed any further diagnostic evaluation that may be needed or ordered today. We also reviewed her medications today. she has been encouraged to call the office with any questions or concerns that should arise related to todays visit.    Orders Placed This Encounter  Procedures   MM DIGITAL SCREENING BILATERAL    No orders of the defined types were placed in this encounter.   Return in about 4 months (around 12/31/2023) for general follow up.   Total time spent:30 Minutes Time spent includes review of chart, medications, test results, and follow up plan with the patient.   [place] Controlled Substance Database was reviewed by me.  This patient was seen by Lynn Ito, PA-C in collaboration with Dr. Beverely Risen as a part of collaborative care  agreement.  Lynn Ito, PA-C Internal medicine

## 2023-09-02 NOTE — Telephone Encounter (Signed)
Notified patient of mammogram appointment date, arrival time, location-Toni 

## 2023-09-14 DIAGNOSIS — Z992 Dependence on renal dialysis: Secondary | ICD-10-CM | POA: Diagnosis not present

## 2023-09-14 DIAGNOSIS — N186 End stage renal disease: Secondary | ICD-10-CM | POA: Diagnosis not present

## 2023-09-15 DIAGNOSIS — D631 Anemia in chronic kidney disease: Secondary | ICD-10-CM | POA: Diagnosis not present

## 2023-09-15 DIAGNOSIS — Z992 Dependence on renal dialysis: Secondary | ICD-10-CM | POA: Diagnosis not present

## 2023-09-15 DIAGNOSIS — N186 End stage renal disease: Secondary | ICD-10-CM | POA: Diagnosis not present

## 2023-10-11 ENCOUNTER — Ambulatory Visit
Admission: RE | Admit: 2023-10-11 | Discharge: 2023-10-11 | Disposition: A | Discharge status: Home / Self Care | Payer: PPO | Source: Ambulatory Visit | Attending: Physician Assistant | Admitting: Physician Assistant

## 2023-10-11 DIAGNOSIS — Z1231 Encounter for screening mammogram for malignant neoplasm of breast: Secondary | ICD-10-CM | POA: Insufficient documentation

## 2023-10-15 DIAGNOSIS — N186 End stage renal disease: Secondary | ICD-10-CM | POA: Diagnosis not present

## 2023-10-15 DIAGNOSIS — Z992 Dependence on renal dialysis: Secondary | ICD-10-CM | POA: Diagnosis not present

## 2023-10-18 ENCOUNTER — Other Ambulatory Visit: Payer: Self-pay | Admitting: Physician Assistant

## 2023-10-18 DIAGNOSIS — R928 Other abnormal and inconclusive findings on diagnostic imaging of breast: Secondary | ICD-10-CM

## 2023-10-25 ENCOUNTER — Ambulatory Visit
Admission: RE | Admit: 2023-10-25 | Discharge: 2023-10-25 | Disposition: A | Discharge status: Home / Self Care | Payer: PPO | Source: Ambulatory Visit | Attending: Physician Assistant | Admitting: Physician Assistant

## 2023-10-25 ENCOUNTER — Ambulatory Visit
Admission: RE | Admit: 2023-10-25 | Discharge: 2023-10-25 | Disposition: A | Discharge status: Home / Self Care | Payer: PPO | Source: Ambulatory Visit | Attending: Physician Assistant

## 2023-10-25 DIAGNOSIS — R928 Other abnormal and inconclusive findings on diagnostic imaging of breast: Secondary | ICD-10-CM | POA: Insufficient documentation

## 2023-12-30 ENCOUNTER — Encounter: Payer: Self-pay | Admitting: Physician Assistant

## 2023-12-30 ENCOUNTER — Ambulatory Visit (INDEPENDENT_AMBULATORY_CARE_PROVIDER_SITE_OTHER): Payer: PPO | Admitting: Physician Assistant

## 2023-12-30 VITALS — BP 123/86 | HR 54 | Temp 97.8°F | Resp 16 | Ht 59.0 in | Wt 131.0 lb

## 2023-12-30 DIAGNOSIS — N186 End stage renal disease: Secondary | ICD-10-CM

## 2023-12-30 DIAGNOSIS — I712 Thoracic aortic aneurysm, without rupture, unspecified: Secondary | ICD-10-CM

## 2023-12-30 DIAGNOSIS — Z992 Dependence on renal dialysis: Secondary | ICD-10-CM

## 2023-12-30 DIAGNOSIS — I10 Essential (primary) hypertension: Secondary | ICD-10-CM | POA: Diagnosis not present

## 2023-12-30 MED ORDER — AMLODIPINE BESYLATE 10 MG PO TABS
10.0000 mg | ORAL_TABLET | Freq: Every day | ORAL | 1 refills | Status: DC
Start: 2023-12-30 — End: 2024-05-04

## 2023-12-30 NOTE — Progress Notes (Signed)
 [facility] [address] [place], [place] [address]  Internal MEDICINE  Office Visit Note  Patient Name: Elizabeth Roy  [id number]  [id number]  Date of Service: 12/30/2023  Chief Complaint  Patient presents with   Follow-up   Hypertension    HPI Pt is here for routine follow up -mammogram UTD -forgot to bring labs again--given reminder card to bring after nephrology visit next week -Did not follow back up with vascular, may seek second opinion. Still thinking about options, but states she doesn't think she wants the surgery they talked about. Urged to follow up on this -did not follow up on GI either, no rectal bleeding recently, but is due to colon screening still -HR low but stable, discussed monitoring as Bisoprolol may need to be decreased if symptoms arise or if dropping more -does sometimes have some trouble with sleep, thinks may be related to new dialysis machine. States it is actually very quiet now, possibly too quiet. Does sleep with tv on and advised against this. May try sleep sounds if needed background noise initially  Current Medication: Outpatient Encounter Medications as of 12/30/2023  Medication Sig   bisoprolol (ZEBETA) 10 MG tablet TAKE 1 TABLET BY MOUTH EVERY DAY   calcitRIOL (ROCALTROL) 0.25 MCG capsule Take 0.25 mcg by mouth daily.   cinacalcet (SENSIPAR) 60 MG tablet Take 60 mg by mouth daily.   irbesartan (AVAPRO) 150 MG tablet Take 150 mg by mouth daily.   spironolactone (ALDACTONE) 25 MG tablet Take 25 mg by mouth daily.   torsemide (DEMADEX) 20 MG tablet Take 20 mg by mouth daily. Two tablets every morning   [DISCONTINUED] amLODipine (NORVASC) 10 MG tablet Take 1 tablet (10 mg total) by mouth daily.   amLODipine (NORVASC) 10 MG tablet Take 1 tablet (10 mg total) by mouth daily.   No facility-administered encounter medications on file as of 12/30/2023.    Surgical History: Past Surgical History:  Procedure Laterality Date   BREAST BIOPSY  Left 11/24/2020   Affirm bx-"X" clip-neg   MOUTH SURGERY      Medical History: Past Medical History:  Diagnosis Date   Arthritis    Hypertension    Renal disorder     Family History: Family History  Problem Relation Age of Onset   Hypertension Mother    Intellectual disability Sister    Stroke Sister     Social History   Socioeconomic History   Marital status: Widowed    Spouse name: Not on file   Number of children: Not on file   Years of education: Not on file   Highest education level: Not on file  Occupational History   Not on file  Tobacco Use   Smoking status: Former    Types: Cigarettes   Smokeless tobacco: Never  Substance and Sexual Activity   Alcohol use: Never   Drug use: No   Sexual activity: Not on file  Other Topics Concern   Not on file  Social History Narrative   Not on file   Social Drivers of Health   Financial Resource Strain: Not on file  Food Insecurity: No Food Insecurity (09/12/2022)   Hunger Vital Sign    Worried About Running Out of Food in the Last Year: Never true    Ran Out of Food in the Last Year: Never true  Transportation Needs: No Transportation Needs (09/12/2022)   PRAPARE - Administrator, Civil Service (Medical): No    Lack of Transportation (  Non-Medical): No  Physical Activity: Not on file  Stress: Not on file  Social Connections: Not on file  Intimate Partner Violence: Not on file      Review of Systems  Constitutional:  Negative for chills, fatigue and unexpected weight change.  HENT:  Negative for congestion, postnasal drip, rhinorrhea, sneezing and sore throat.   Eyes:  Negative for redness.  Respiratory:  Negative for cough, chest tightness and shortness of breath.   Cardiovascular:  Negative for chest pain and palpitations.  Gastrointestinal:  Negative for abdominal pain, diarrhea, nausea and vomiting.  Genitourinary:  Negative for dysuria and frequency.  Musculoskeletal:  Negative for  arthralgias, back pain, joint swelling and neck pain.  Skin:  Negative for rash.  Neurological: Negative.  Negative for tremors and numbness.  Hematological:  Negative for adenopathy. Does not bruise/bleed easily.  Psychiatric/Behavioral:  Positive for sleep disturbance. Negative for behavioral problems (Depression) and suicidal ideas. The patient is not nervous/anxious.     Vital Signs: BP 123/86   Pulse (!) 54   Temp 97.8 F (36.6 C)   Resp 16   Ht 4\' 11"  (1.499 m)   Wt 131 lb (59.4 kg)   SpO2 95%   BMI 26.46 kg/m    Physical Exam Vitals and nursing note reviewed.  Constitutional:      Appearance: Normal appearance.  HENT:     Head: Normocephalic.  Eyes:     Pupils: Pupils are equal, round, and reactive to light.  Cardiovascular:     Rate and Rhythm: Regular rhythm. Bradycardia present.  Pulmonary:     Effort: Pulmonary effort is normal.     Breath sounds: Normal breath sounds.  Musculoskeletal:        General: Normal range of motion.  Skin:    General: Skin is warm and dry.  Neurological:     Mental Status: She is alert.  Psychiatric:        Mood and Affect: Mood normal.        Behavior: Behavior normal.        Assessment/Plan: 1. Essential hypertension (Primary) BP well controlled, HR slightly low but stable for her and pt asymptomatic. Continue current medications and monitor. May decrease bisoprolol in future if needed - amLODipine (NORVASC) 10 MG tablet; Take 1 tablet (10 mg total) by mouth daily.  Dispense: 90 tablet; Refill: 1  2. ESRD (end stage renal disease) on dialysis [facility]) Followed by nephrology and given card to remind her to bring copy of labs  3. Thoracic aortic aneurysm without rupture, unspecified part (HCC) Urged to follow up with vascular   General Counseling: Elizabeth Roy verbalizes understanding of the findings of todays visit and agrees with plan of treatment. I have discussed any further diagnostic evaluation that may be needed or ordered  today. We also reviewed her medications today. she has been encouraged to call the office with any questions or concerns that should arise related to todays visit.    No orders of the defined types were placed in this encounter.   Meds ordered this encounter  Medications   amLODipine (NORVASC) 10 MG tablet    Sig: Take 1 tablet (10 mg total) by mouth daily.    Dispense:  90 tablet    Refill:  1    This patient was seen by Lynn Ito, PA-C in collaboration with Dr. Beverely Risen as a part of collaborative care agreement.   Total time spent:30 Minutes Time spent includes review of chart, medications, test results,  and follow up plan with the patient.      Dr Lyndon Code Internal medicine

## 2024-01-08 ENCOUNTER — Other Ambulatory Visit: Payer: Self-pay | Admitting: Physician Assistant

## 2024-05-04 ENCOUNTER — Ambulatory Visit (INDEPENDENT_AMBULATORY_CARE_PROVIDER_SITE_OTHER): Admitting: Physician Assistant

## 2024-05-04 ENCOUNTER — Encounter: Payer: Self-pay | Admitting: Physician Assistant

## 2024-05-04 VITALS — BP 126/84 | HR 71 | Temp 98.4°F | Resp 16 | Ht 59.0 in | Wt 124.6 lb

## 2024-05-04 DIAGNOSIS — N186 End stage renal disease: Secondary | ICD-10-CM | POA: Diagnosis not present

## 2024-05-04 DIAGNOSIS — Z1329 Encounter for screening for other suspected endocrine disorder: Secondary | ICD-10-CM | POA: Diagnosis not present

## 2024-05-04 DIAGNOSIS — I10 Essential (primary) hypertension: Secondary | ICD-10-CM

## 2024-05-04 DIAGNOSIS — R5383 Other fatigue: Secondary | ICD-10-CM

## 2024-05-04 DIAGNOSIS — I712 Thoracic aortic aneurysm, without rupture, unspecified: Secondary | ICD-10-CM | POA: Diagnosis not present

## 2024-05-04 DIAGNOSIS — Z992 Dependence on renal dialysis: Secondary | ICD-10-CM

## 2024-05-04 MED ORDER — BISOPROLOL FUMARATE 10 MG PO TABS: 10.0000 mg | ORAL_TABLET | Freq: Every day | ORAL | 1 refills | Status: AC

## 2024-05-04 MED ORDER — AMLODIPINE BESYLATE 10 MG PO TABS: 10.0000 mg | ORAL_TABLET | Freq: Every day | ORAL | 1 refills | Status: AC

## 2024-05-04 NOTE — Progress Notes (Signed)
 [facility] [address] [place], [place] [address]  Internal MEDICINE  Office Visit Note  Patient Name: Elizabeth Roy  [id number]  [id number]  Date of Service: 05/04/2024  Chief Complaint  Patient presents with   Hypertension   Follow-up    HPI Pt is here for routine follow up -BP stable -most of labs done with nephrology, scanned in to chart. Will check thyroid  and LFTs since not incuded in testing -does home dialysis at night -still needs to follow up with vascular for aneurysm, but refuses surgery and this has been the reason why she hasn't gone back. States she was told she wouldn't be able to do most of her normal tasks after and may require assistance/caregiver so this scared her away from surgery. Discussed follow up to check if progression as well as get clarification regarding this as this may have been regarding postop vs [place] term expectations  Current Medication: Outpatient Encounter Medications as of 05/04/2024  Medication Sig   amLODipine  (NORVASC ) 10 MG tablet Take 1 tablet (10 mg total) by mouth daily.   bisoprolol  (ZEBETA ) 10 MG tablet Take 1 tablet (10 mg total) by mouth daily.   calcitRIOL (ROCALTROL) 0.25 MCG capsule Take 0.25 mcg by mouth daily.   cinacalcet  (SENSIPAR ) 60 MG tablet Take 60 mg by mouth daily.   irbesartan  (AVAPRO ) 150 MG tablet Take 150 mg by mouth daily.   spironolactone (ALDACTONE) 25 MG tablet Take 25 mg by mouth daily.   torsemide (DEMADEX) 20 MG tablet Take 20 mg by mouth daily. Two tablets every morning   [DISCONTINUED] amLODipine  (NORVASC ) 10 MG tablet Take 1 tablet (10 mg total) by mouth daily.   [DISCONTINUED] bisoprolol  (ZEBETA ) 10 MG tablet TAKE 1 TABLET BY MOUTH EVERY DAY   No facility-administered encounter medications on file as of 05/04/2024.    Surgical History: Past Surgical History:  Procedure Laterality Date   BREAST BIOPSY Left 11/24/2020   Affirm bx-X clip-neg   MOUTH SURGERY      Medical  History: Past Medical History:  Diagnosis Date   Arthritis    Hypertension    Renal disorder     Family History: Family History  Problem Relation Age of Onset   Hypertension Mother    Intellectual disability Sister    Stroke Sister     Social History   Socioeconomic History   Marital status: Widowed    Spouse name: Not on file   Number of children: Not on file   Years of education: Not on file   Highest education level: Not on file  Occupational History   Not on file  Tobacco Use   Smoking status: Former    Types: Cigarettes   Smokeless tobacco: Never  Substance and Sexual Activity   Alcohol use: Never   Drug use: No   Sexual activity: Not on file  Other Topics Concern   Not on file  Social History Narrative   Not on file   Social Drivers of Health   Financial Resource Strain: Not on file  Food Insecurity: No Food Insecurity (09/12/2022)   Hunger Vital Sign    Worried About Running Out of Food in the Last Year: Never true    Ran Out of Food in the Last Year: Never true  Transportation Needs: No Transportation Needs (09/12/2022)   PRAPARE - Administrator, Civil Service (Medical): No    Lack of Transportation (Non-Medical): No  Physical Activity: Not on file  Stress: Not  on file  Social Connections: Not on file  Intimate Partner Violence: Not on file      Review of Systems  Constitutional:  Negative for chills, fatigue and unexpected weight change.  HENT:  Negative for congestion, postnasal drip, rhinorrhea, sneezing and sore throat.   Eyes:  Negative for redness.  Respiratory:  Negative for cough, chest tightness and shortness of breath.   Cardiovascular:  Negative for chest pain and palpitations.  Gastrointestinal:  Negative for abdominal pain, diarrhea, nausea and vomiting.  Genitourinary:  Negative for dysuria and frequency.  Musculoskeletal:  Negative for arthralgias, back pain, joint swelling and neck pain.  Skin:  Negative for rash.   Neurological: Negative.  Negative for tremors and numbness.  Hematological:  Negative for adenopathy. Does not bruise/bleed easily.  Psychiatric/Behavioral:  Negative for behavioral problems (Depression) and suicidal ideas. The patient is not nervous/anxious.     Vital Signs: BP 126/84   Pulse 71   Temp 98.4 F (36.9 C)   Resp 16   Ht 4' 11 (1.499 m)   Wt 124 lb 9.6 oz (56.5 kg)   SpO2 98%   BMI 25.17 kg/m    Physical Exam Vitals and nursing note reviewed.  Constitutional:      Appearance: Normal appearance.  HENT:     Head: Normocephalic.  Eyes:     Pupils: Pupils are equal, round, and reactive to light.  Cardiovascular:     Rate and Rhythm: Normal rate and regular rhythm.  Pulmonary:     Effort: Pulmonary effort is normal.     Breath sounds: Normal breath sounds.  Musculoskeletal:        General: Normal range of motion.  Skin:    General: Skin is warm and dry.  Neurological:     Mental Status: She is alert.  Psychiatric:        Mood and Affect: Mood normal.        Behavior: Behavior normal.        Assessment/Plan: 1. Essential hypertension (Primary) Continue current medications - amLODipine  (NORVASC ) 10 MG tablet; Take 1 tablet (10 mg total) by mouth daily.  Dispense: 90 tablet; Refill: 1  2. Thyroid  disorder screen Will check labs - TSH + free T4  3. Thoracic aortic aneurysm without rupture, unspecified part (HCC) Follow up with vascular  4. ESRD (end stage renal disease) on dialysis [facility]) Followed by nephrology, most labs done there as well  5. Other fatigue - TSH + free T4 - Hepatic function panel   General Counseling: Elizabeth Roy verbalizes understanding of the findings of todays visit and agrees with plan of treatment. I have discussed any further diagnostic evaluation that may be needed or ordered today. We also reviewed her medications today. she has been encouraged to call the office with any questions or concerns that should arise related to  todays visit.    Orders Placed This Encounter  Procedures   TSH + free T4   Hepatic function panel    Meds ordered this encounter  Medications   bisoprolol  (ZEBETA ) 10 MG tablet    Sig: Take 1 tablet (10 mg total) by mouth daily.    Dispense:  90 tablet    Refill:  1   amLODipine  (NORVASC ) 10 MG tablet    Sig: Take 1 tablet (10 mg total) by mouth daily.    Dispense:  90 tablet    Refill:  1    This patient was seen by Tinnie Pro, PA-C in collaboration with Dr.  Sigrid Bathe as a part of collaborative care agreement.   Total time spent:30 Minutes Time spent includes review of chart, medications, test results, and follow up plan with the patient.      Dr Fozia M Khan Internal medicine

## 2024-08-18 ENCOUNTER — Ambulatory Visit
Admission: RE | Admit: 2024-08-18 | Discharge: 2024-08-18 | Disposition: A | Discharge status: Home / Self Care | Attending: Nephrology | Admitting: Nephrology

## 2024-08-18 ENCOUNTER — Other Ambulatory Visit: Payer: Self-pay | Admitting: Nephrology

## 2024-08-18 ENCOUNTER — Ambulatory Visit
Admission: RE | Admit: 2024-08-18 | Discharge: 2024-08-18 | Disposition: A | Discharge status: Home / Self Care | Source: Ambulatory Visit | Attending: Nephrology | Admitting: Nephrology

## 2024-08-18 DIAGNOSIS — N186 End stage renal disease: Secondary | ICD-10-CM

## 2024-09-07 ENCOUNTER — Ambulatory Visit: Payer: PPO | Admitting: Physician Assistant

## 2024-09-14 ENCOUNTER — Ambulatory Visit (INDEPENDENT_AMBULATORY_CARE_PROVIDER_SITE_OTHER): Admitting: Physician Assistant

## 2024-09-14 ENCOUNTER — Encounter: Payer: Self-pay | Admitting: Physician Assistant

## 2024-09-14 VITALS — BP 105/80 | HR 68 | Temp 98.0°F | Resp 16 | Ht 59.0 in | Wt 124.0 lb

## 2024-09-14 DIAGNOSIS — Z1231 Encounter for screening mammogram for malignant neoplasm of breast: Secondary | ICD-10-CM

## 2024-09-14 DIAGNOSIS — Z0001 Encounter for general adult medical examination with abnormal findings: Secondary | ICD-10-CM | POA: Diagnosis not present

## 2024-09-14 DIAGNOSIS — N186 End stage renal disease: Secondary | ICD-10-CM

## 2024-09-14 DIAGNOSIS — Z992 Dependence on renal dialysis: Secondary | ICD-10-CM

## 2024-09-14 DIAGNOSIS — Z1211 Encounter for screening for malignant neoplasm of colon: Secondary | ICD-10-CM | POA: Diagnosis not present

## 2024-09-14 DIAGNOSIS — I10 Essential (primary) hypertension: Secondary | ICD-10-CM | POA: Diagnosis not present

## 2024-09-14 DIAGNOSIS — Z1212 Encounter for screening for malignant neoplasm of rectum: Secondary | ICD-10-CM

## 2024-09-14 DIAGNOSIS — R300 Dysuria: Secondary | ICD-10-CM

## 2024-09-14 NOTE — Progress Notes (Signed)
 [facility] [address] [place], [place] [address]  Internal MEDICINE  Office Visit Note  Patient Name: Elizabeth Roy  [id number]  [id number]  Date of Service: 09/14/2024  Chief Complaint  Patient presents with   Medicare Wellness   Hypertension   Quality Metric Gaps    Cologuard     HPI Elizabeth Roy presents for an annual well visit Well-appearing 68 y.o.female Routine CRC screening: open to cologuard now, but does have occasional bleeding and had discussed colonoscopy in past but did not move forward with this. Will need screening and may start with cologuard but may still need colonoscopy Routine mammogram: ordered Labs: reviewed nephrology labs and added thyroid  and liver screening tests last visit. Will need to have these done still -BP stable, on low side of normal and monitors -continues to do home dialysis and follow up with nephrology     09/14/2024   11:34 AM 09/02/2023   11:14 AM 08/27/2022   11:14 AM  MMSE - Mini Mental State Exam  Orientation to time 5 5 5   Orientation to Place 5 5 5   Registration 3 3 3   Attention/ Calculation 5 5 5   Recall 3 3 3   Language- name 2 objects 2 2 2   Language- repeat 1 1 1   Language- follow 3 step command 3 3 3   Language- read & follow direction 1 1 1   Write a sentence 1 0 0  Copy design 1 1 1   Total score 30 29 29     Functional Status Survey: Is the patient deaf or have difficulty hearing?: No Does the patient have difficulty seeing, even when wearing glasses/contacts?: No Does the patient have difficulty concentrating, remembering, or making decisions?: No Does the patient have difficulty walking or climbing stairs?: No Does the patient have difficulty dressing or bathing?: No Does the patient have difficulty doing errands alone such as visiting a doctor's office or shopping?: No     12/24/2022   11:37 AM 05/03/2023   11:52 AM 09/02/2023   11:12 AM 12/30/2023   11:42 AM 09/14/2024   11:34 AM  Fall Risk  Falls  in the past year? 0 0 0 0 0  Was there an injury with Fall?  0 0    Fall Risk Category Calculator  0 0    Patient at Risk for Falls Due to  No Fall Risks No Fall Risks No Fall Risks   Fall risk Follow up  Falls evaluation completed Falls evaluation completed Falls evaluation completed        09/14/2024   11:34 AM  Depression screen PHQ 2/9  Decreased Interest 0  Down, Depressed, Hopeless 0  PHQ - 2 Score 0        No data to display            Current Medication: Outpatient Encounter Medications as of 09/14/2024  Medication Sig   amLODipine  (NORVASC ) 10 MG tablet Take 1 tablet (10 mg total) by mouth daily.   bisoprolol  (ZEBETA ) 10 MG tablet Take 1 tablet (10 mg total) by mouth daily.   calcitRIOL (ROCALTROL) 0.25 MCG capsule Take 0.25 mcg by mouth daily.   cinacalcet  (SENSIPAR ) 60 MG tablet Take 60 mg by mouth daily.   irbesartan  (AVAPRO ) 150 MG tablet Take 150 mg by mouth daily.   spironolactone (ALDACTONE) 25 MG tablet Take 25 mg by mouth daily.   torsemide (DEMADEX) 20 MG tablet Take 20 mg by mouth daily. Two tablets every morning   No facility-administered  encounter medications on file as of 09/14/2024.    Surgical History: Past Surgical History:  Procedure Laterality Date   BREAST BIOPSY Left 11/24/2020   Affirm [id number] [id number]   MOUTH SURGERY      Medical History: Past Medical History:  Diagnosis Date   Arthritis    Hypertension    Renal disorder     Family History: Family History  Problem Relation Age of Onset   Hypertension Mother    Intellectual disability Sister    Stroke Sister     Social History   Socioeconomic History   Marital status: Widowed    Spouse name: Not on file   Number of children: Not on file   Years of education: Not on file   Highest education level: Not on file  Occupational History   Not on file  Tobacco Use   Smoking status: Former    Types: Cigarettes   Smokeless tobacco: Never  Substance and Sexual Activity    Alcohol use: Never   Drug use: No   Sexual activity: Not on file  Other Topics Concern   Not on file  Social History Narrative   Not on file   Social Drivers of Health   Financial Resource Strain: Not on file  Food Insecurity: No Food Insecurity (09/12/2022)   Hunger Vital Sign    Worried About Running Out of Food in the Last Year: Never true    Ran Out of Food in the Last Year: Never true  Transportation Needs: No Transportation Needs (09/12/2022)   PRAPARE - Administrator, Civil Service (Medical): No    Lack of Transportation (Non-Medical): No  Physical Activity: Not on file  Stress: Not on file  Social Connections: Not on file  Intimate Partner Violence: Not on file      Review of Systems  Constitutional:  Negative for chills, fatigue and unexpected weight change.  HENT:  Negative for congestion, postnasal drip, rhinorrhea, sneezing and sore throat.   Eyes:  Negative for redness.  Respiratory:  Negative for cough, chest tightness and shortness of breath.   Cardiovascular:  Negative for chest pain and palpitations.  Gastrointestinal:  Negative for abdominal pain, diarrhea, nausea and vomiting.  Genitourinary:  Negative for dysuria and frequency.  Musculoskeletal:  Negative for arthralgias, back pain, joint swelling and neck pain.  Skin:  Negative for rash.  Neurological: Negative.  Negative for tremors and numbness.  Hematological:  Negative for adenopathy. Does not bruise/bleed easily.  Psychiatric/Behavioral:  Negative for behavioral problems (Depression) and suicidal ideas. The patient is not nervous/anxious.     Vital Signs: BP 105/80   Pulse 68   Temp 98 F (36.7 C)   Resp 16   Ht 4' 11 (1.499 m)   Wt 124 lb (56.2 kg)   SpO2 98%   BMI 25.04 kg/m    Physical Exam Vitals and nursing note reviewed.  Constitutional:      Appearance: Normal appearance.  HENT:     Head: Normocephalic.  Eyes:     Extraocular Movements: Extraocular movements  intact.  Cardiovascular:     Rate and Rhythm: Normal rate and regular rhythm.  Pulmonary:     Effort: Pulmonary effort is normal.     Breath sounds: Normal breath sounds.  Musculoskeletal:        General: Normal range of motion.  Skin:    General: Skin is warm and dry.  Neurological:     Mental Status: She is alert.  Psychiatric:  Mood and Affect: Mood normal.        Behavior: Behavior normal.        Assessment/Plan: 1. Encounter for Medicare annual examination with abnormal findings (Primary) AWV performed, due for colon screening, will have remaining labs done  2. Essential hypertension Stable, continue current medication  3. ESRD (end stage renal disease) on dialysis [facility]) Followed by nephrology  4. Screening for colorectal cancer - Cologuard  5. Visit for screening mammogram - MM 3D SCREENING MAMMOGRAM BILATERAL BREAST; Future  6. Dysuria - UA/M w/rflx Culture, Routine     General Counseling: Bren verbalizes understanding of the findings of todays visit and agrees with plan of treatment. I have discussed any further diagnostic evaluation that may be needed or ordered today. We also reviewed her medications today. she has been encouraged to call the office with any questions or concerns that should arise related to todays visit.    Orders Placed This Encounter  Procedures   MM 3D SCREENING MAMMOGRAM BILATERAL BREAST   UA/M w/rflx Culture, Routine   Cologuard    No orders of the defined types were placed in this encounter.   Return in about 4 months (around 01/13/2025) for general follow up, please reprint lab slip from july.   Total time spent:35 Minutes Time spent includes review of chart, medications, test results, and follow up plan with the patient.   [place] Controlled Substance Database was reviewed by me.  This patient was seen by Tinnie Pro, PA-C in collaboration with Dr. Sigrid Bathe as a part of collaborative care agreement.  Tinnie Pro, PA-C Internal medicine

## 2024-09-15 LAB — UA/M W/RFLX CULTURE, ROUTINE
Bilirubin, UA: NEGATIVE
Glucose, UA: NEGATIVE
Ketones, UA: NEGATIVE
Leukocytes,UA: NEGATIVE
Nitrite, UA: NEGATIVE
Specific Gravity, UA: 1.011 (ref 1.005–1.030)
Urobilinogen, Ur: 0.2 mg/dL (ref 0.2–1.0)
pH, UA: >=9 — AB (ref 5.0–7.5)

## 2024-09-15 LAB — MICROSCOPIC EXAMINATION
Bacteria, UA: NONE SEEN
Casts: NONE SEEN /LPF

## 2024-11-06 ENCOUNTER — Encounter

## 2024-12-08 ENCOUNTER — Encounter

## 2025-01-14 ENCOUNTER — Ambulatory Visit: Admitting: Physician Assistant

## 2025-09-16 ENCOUNTER — Ambulatory Visit: Admitting: Physician Assistant
